# Patient Record
Sex: Male | Born: 1940 | Race: White | Hispanic: No | Marital: Married | Smoking: Never smoker
Health system: Southern US, Community
[De-identification: ages and names within clinical notes are randomized; demographics above are authoritative.]

## PROBLEM LIST (undated history)

## (undated) DIAGNOSIS — Z22322 Carrier or suspected carrier of Methicillin resistant Staphylococcus aureus: Secondary | ICD-10-CM

## (undated) DIAGNOSIS — I509 Heart failure, unspecified: Secondary | ICD-10-CM

## (undated) DIAGNOSIS — G4730 Sleep apnea, unspecified: Secondary | ICD-10-CM

## (undated) DIAGNOSIS — R112 Nausea with vomiting, unspecified: Secondary | ICD-10-CM

## (undated) DIAGNOSIS — E785 Hyperlipidemia, unspecified: Secondary | ICD-10-CM

## (undated) DIAGNOSIS — I2510 Atherosclerotic heart disease of native coronary artery without angina pectoris: Secondary | ICD-10-CM

## (undated) DIAGNOSIS — J449 Chronic obstructive pulmonary disease, unspecified: Secondary | ICD-10-CM

## (undated) DIAGNOSIS — Z951 Presence of aortocoronary bypass graft: Secondary | ICD-10-CM

## (undated) DIAGNOSIS — Z9289 Personal history of other medical treatment: Secondary | ICD-10-CM

## (undated) DIAGNOSIS — I219 Acute myocardial infarction, unspecified: Secondary | ICD-10-CM

## (undated) DIAGNOSIS — C801 Malignant (primary) neoplasm, unspecified: Secondary | ICD-10-CM

## (undated) DIAGNOSIS — I499 Cardiac arrhythmia, unspecified: Secondary | ICD-10-CM

## (undated) DIAGNOSIS — Z955 Presence of coronary angioplasty implant and graft: Secondary | ICD-10-CM

## (undated) DIAGNOSIS — Z87442 Personal history of urinary calculi: Secondary | ICD-10-CM

## (undated) DIAGNOSIS — R0602 Shortness of breath: Secondary | ICD-10-CM

## (undated) DIAGNOSIS — E119 Type 2 diabetes mellitus without complications: Secondary | ICD-10-CM

## (undated) DIAGNOSIS — Z9889 Other specified postprocedural states: Secondary | ICD-10-CM

## (undated) DIAGNOSIS — M1712 Unilateral primary osteoarthritis, left knee: Secondary | ICD-10-CM

## (undated) DIAGNOSIS — Z77098 Contact with and (suspected) exposure to other hazardous, chiefly nonmedicinal, chemicals: Secondary | ICD-10-CM

## (undated) DIAGNOSIS — R011 Cardiac murmur, unspecified: Secondary | ICD-10-CM

## (undated) DIAGNOSIS — I4891 Unspecified atrial fibrillation: Secondary | ICD-10-CM

## (undated) DIAGNOSIS — I10 Essential (primary) hypertension: Secondary | ICD-10-CM

## (undated) DIAGNOSIS — N189 Chronic kidney disease, unspecified: Secondary | ICD-10-CM

## (undated) HISTORY — DX: Unilateral primary osteoarthritis, left knee: M17.12

## (undated) HISTORY — DX: Essential (primary) hypertension: I10

## (undated) HISTORY — DX: Unspecified atrial fibrillation: I48.91

## (undated) HISTORY — DX: Presence of coronary angioplasty implant and graft: Z95.5

## (undated) HISTORY — DX: Presence of aortocoronary bypass graft: Z95.1

## (undated) HISTORY — DX: Hyperlipidemia, unspecified: E78.5

## (undated) HISTORY — DX: Atherosclerotic heart disease of native coronary artery without angina pectoris: I25.10

## (undated) HISTORY — PX: OTHER SURGICAL HISTORY: SHX169

## (undated) HISTORY — PX: NASAL SINUS SURGERY: SHX719

---

## 1986-04-07 HISTORY — PX: CORONARY ARTERY BYPASS GRAFT: SHX141

## 2003-04-08 HISTORY — PX: PTCA: SHX146

## 2003-04-08 HISTORY — PX: OTHER SURGICAL HISTORY: SHX169

## 2004-01-06 ENCOUNTER — Encounter: Payer: Self-pay | Admitting: Internal Medicine

## 2004-02-06 ENCOUNTER — Encounter: Payer: Self-pay | Admitting: Internal Medicine

## 2004-05-14 ENCOUNTER — Encounter: Payer: Self-pay | Admitting: Internal Medicine

## 2005-03-24 ENCOUNTER — Ambulatory Visit: Payer: Self-pay | Admitting: Unknown Physician Specialty

## 2005-04-16 ENCOUNTER — Other Ambulatory Visit: Payer: Self-pay

## 2005-04-16 ENCOUNTER — Inpatient Hospital Stay: Payer: Self-pay | Admitting: Unknown Physician Specialty

## 2005-10-01 ENCOUNTER — Ambulatory Visit: Payer: Self-pay | Admitting: Physician Assistant

## 2005-12-05 ENCOUNTER — Ambulatory Visit: Payer: Self-pay | Admitting: Physician Assistant

## 2006-09-18 ENCOUNTER — Ambulatory Visit: Payer: Self-pay | Admitting: Specialist

## 2007-07-11 ENCOUNTER — Other Ambulatory Visit: Payer: Self-pay

## 2007-07-11 ENCOUNTER — Emergency Department: Payer: Self-pay | Admitting: Unknown Physician Specialty

## 2008-02-15 ENCOUNTER — Ambulatory Visit: Payer: Self-pay | Admitting: Specialist

## 2008-07-25 ENCOUNTER — Ambulatory Visit: Payer: Self-pay | Admitting: Neurology

## 2008-10-20 ENCOUNTER — Ambulatory Visit: Payer: Self-pay | Admitting: Rheumatology

## 2009-05-28 ENCOUNTER — Emergency Department: Payer: Self-pay | Admitting: Emergency Medicine

## 2010-01-28 ENCOUNTER — Ambulatory Visit: Payer: Self-pay | Admitting: Internal Medicine

## 2010-05-21 ENCOUNTER — Ambulatory Visit: Payer: Self-pay | Admitting: Sports Medicine

## 2010-08-08 ENCOUNTER — Ambulatory Visit: Payer: Self-pay | Admitting: Orthopedic Surgery

## 2010-12-16 ENCOUNTER — Other Ambulatory Visit: Payer: Self-pay | Admitting: *Deleted

## 2010-12-16 MED ORDER — LABETALOL HCL 100 MG PO TABS: 100.0000 mg | ORAL_TABLET | Freq: Two times a day (BID) | ORAL | Status: DC

## 2011-01-20 ENCOUNTER — Other Ambulatory Visit: Payer: Self-pay | Admitting: Internal Medicine

## 2011-03-27 ENCOUNTER — Telehealth: Payer: Self-pay | Admitting: Internal Medicine

## 2011-03-27 NOTE — Telephone Encounter (Signed)
Agree. We can see him if symptoms are persistent.

## 2011-03-27 NOTE — Telephone Encounter (Signed)
Cough, no congestion, no fever, some wheezing, no trouble breathing. Advise pt to try mucinex or mucinex dm otc to help

## 2011-03-27 NOTE — Telephone Encounter (Signed)
[phone] Pt called wanting to be seen today or tomorrow sore in ribs back and chest  X 3 weeks Coughing wasn't sure if he has fever

## 2011-03-27 NOTE — Telephone Encounter (Signed)
Pt scheduled for OV tomorrow for eval.

## 2011-03-28 ENCOUNTER — Ambulatory Visit (INDEPENDENT_AMBULATORY_CARE_PROVIDER_SITE_OTHER): Payer: Medicare Other | Admitting: Internal Medicine

## 2011-03-28 ENCOUNTER — Encounter: Payer: Self-pay | Admitting: Internal Medicine

## 2011-03-28 VITALS — BP 138/72 | HR 52 | Temp 97.6°F | Wt 216.0 lb

## 2011-03-28 DIAGNOSIS — E785 Hyperlipidemia, unspecified: Secondary | ICD-10-CM

## 2011-03-28 DIAGNOSIS — E039 Hypothyroidism, unspecified: Secondary | ICD-10-CM

## 2011-03-28 DIAGNOSIS — J40 Bronchitis, not specified as acute or chronic: Secondary | ICD-10-CM

## 2011-03-28 LAB — LIPID PANEL
Cholesterol: 179 mg/dL (ref 0–200)
HDL: 26 mg/dL — ABNORMAL LOW (ref >39.00)
LDL Cholesterol: 124 mg/dL — ABNORMAL HIGH (ref 0–99)
Total CHOL/HDL Ratio: 7
Triglycerides: 146 mg/dL (ref 0.0–149.0)
VLDL: 29.2 mg/dL (ref 0.0–40.0)

## 2011-03-28 LAB — COMPREHENSIVE METABOLIC PANEL
ALT: 14 U/L (ref 0–53)
AST: 15 U/L (ref 0–37)
Albumin: 4.1 g/dL (ref 3.5–5.2)
Alkaline Phosphatase: 57 U/L (ref 39–117)
BUN: 16 mg/dL (ref 6–23)
CO2: 30 mEq/L (ref 19–32)
Calcium: 9.2 mg/dL (ref 8.4–10.5)
Chloride: 101 mEq/L (ref 96–112)
Creatinine, Ser: 1 mg/dL (ref 0.4–1.5)
GFR: 77.6 mL/min (ref >60.00)
Glucose, Bld: 136 mg/dL — ABNORMAL HIGH (ref 70–99)
Potassium: 3.3 mEq/L — ABNORMAL LOW (ref 3.5–5.1)
Sodium: 141 mEq/L (ref 135–145)
Total Bilirubin: 1.1 mg/dL (ref 0.3–1.2)
Total Protein: 7.2 g/dL (ref 6.0–8.3)

## 2011-03-28 LAB — CBC WITH DIFFERENTIAL/PLATELET
Basophils Absolute: 0.1 10*3/uL (ref 0.0–0.1)
Basophils Relative: 0.7 % (ref 0.0–3.0)
Eosinophils Absolute: 0.5 10*3/uL (ref 0.0–0.7)
Eosinophils Relative: 5.1 % — ABNORMAL HIGH (ref 0.0–5.0)
HCT: 41.2 % (ref 39.0–52.0)
Hemoglobin: 13.6 g/dL (ref 13.0–17.0)
Lymphocytes Relative: 18.4 % (ref 12.0–46.0)
Lymphs Abs: 1.7 10*3/uL (ref 0.7–4.0)
MCHC: 33.1 g/dL (ref 30.0–36.0)
MCV: 85.3 fl (ref 78.0–100.0)
Monocytes Absolute: 0.8 10*3/uL (ref 0.1–1.0)
Monocytes Relative: 9.1 % (ref 3.0–12.0)
Neutro Abs: 6.1 10*3/uL (ref 1.4–7.7)
Neutrophils Relative %: 66.7 % (ref 43.0–77.0)
Platelets: 219 10*3/uL (ref 150.0–400.0)
RBC: 4.83 Mil/uL (ref 4.22–5.81)
RDW: 14.5 % (ref 11.5–14.6)
WBC: 9.2 10*3/uL (ref 4.5–10.5)

## 2011-03-28 LAB — TSH: TSH: 3.39 u[IU]/mL (ref 0.35–5.50)

## 2011-03-28 MED ORDER — ALBUTEROL SULFATE HFA 108 (90 BASE) MCG/ACT IN AERS: 2.0000 | INHALATION_SPRAY | Freq: Four times a day (QID) | RESPIRATORY_TRACT | Status: DC | PRN

## 2011-03-28 MED ORDER — PREDNISONE (PAK) 10 MG PO TABS: ORAL_TABLET | ORAL | Status: DC

## 2011-03-28 MED ORDER — DOXYCYCLINE HYCLATE 100 MG PO TABS: 100.0000 mg | ORAL_TABLET | Freq: Two times a day (BID) | ORAL | Status: AC

## 2011-03-29 ENCOUNTER — Encounter: Payer: Self-pay | Admitting: Internal Medicine

## 2011-03-29 DIAGNOSIS — E039 Hypothyroidism, unspecified: Secondary | ICD-10-CM | POA: Insufficient documentation

## 2011-03-29 DIAGNOSIS — E785 Hyperlipidemia, unspecified: Secondary | ICD-10-CM | POA: Insufficient documentation

## 2011-03-29 NOTE — Progress Notes (Signed)
Subjective:    Patient ID: Chase Keller, male    DOB: May 27, 1940, 70 y.o.   MRN: [id number]  HPI 70 year old male presents for an acute visit complaining of a one-week history of cough and shortness of breath. He notes that over the last 2 weeks he developed some nasal congestion and sinus pressure. Over the last week he has noted dry cough and worsening shortness of breath. His wife notes that he has become very short of breath when walking even short distances or up a flight of stairs. He had an inhaler at home which she thinks was albuterol and tried using this with minimal improvement. He is not a smoker and has no history of smoking but was exposed to secondhand smoke as a child. She notes that he has never been diagnosed with COPD. He does have sleep apnea. He denies any fever or chills.  He is also concerned because he was told in the past that he had a thyroid disorder. He reports that this has not been checked recently. He stopped taking his thyroid medication several months ago.  Outpatient Encounter Prescriptions as of 03/28/2011  Medication Sig Dispense Refill  . clopidogrel (PLAVIX) 75 MG tablet TAKE 1 TABLET BY MOUTH DAILY  30 tablet  6  . fish oil-omega-3 fatty acids 1000 MG capsule Take 1 g by mouth daily.        Marland Kitchen glucosamine-chondroitin 500-400 MG tablet Take 1 tablet by mouth 2 (two) times daily.        Marland Kitchen labetalol (NORMODYNE) 100 MG tablet Take 1 tablet (100 mg total) by mouth 2 (two) times daily.  60 tablet  5  . OVER THE COUNTER MEDICATION B1 250 mg, B2 100mg , B6 100mg , B12 1000mg , C 1000mg , Calcium 1200mg , D 1000IU, D3 5000IU, E 400IU       . Potassium 99 MG TABS Take by mouth 2 (two) times daily.        . valsartan-hydrochlorothiazide (DIOVAN-HCT) 160-25 MG per tablet Take 1 tablet by mouth daily.          Review of Systems  Constitutional: Negative for fever, chills, activity change and fatigue.  HENT: Positive for congestion and sinus pressure. Negative for hearing  loss, ear pain, nosebleeds, sore throat, rhinorrhea, sneezing, trouble swallowing, neck pain, neck stiffness, voice change, postnasal drip, tinnitus and ear discharge.   Eyes: Negative for discharge, redness, itching and visual disturbance.  Respiratory: Positive for cough, chest tightness and shortness of breath. Negative for wheezing and stridor.   Cardiovascular: Negative for chest pain and leg swelling.  Musculoskeletal: Negative for myalgias and arthralgias.  Skin: Negative for color change and rash.  Neurological: Negative for dizziness, facial asymmetry and headaches.  Psychiatric/Behavioral: Negative for sleep disturbance.   BP 138/72  Pulse 52  Temp(Src) 97.6 F (36.4 C) (Oral)  Wt 216 lb (97.977 kg)  SpO2 93%     Objective:   Physical Exam  Constitutional: He is oriented to person, place, and time. He appears well-developed and well-nourished. No distress.  HENT:  Head: Normocephalic and atraumatic.  Right Ear: External ear normal.  Left Ear: External ear normal.  Nose: Nose normal.  Mouth/Throat: Oropharynx is clear and moist. No oropharyngeal exudate.  Eyes: Conjunctivae and EOM are normal. Pupils are equal, round, and reactive to light. Right eye exhibits no discharge. Left eye exhibits no discharge. No scleral icterus.  Neck: Normal range of motion. Neck supple. No tracheal deviation present. No thyromegaly present.  Cardiovascular: Normal rate, regular rhythm  and normal heart sounds.  Exam reveals no gallop and no friction rub.   No murmur heard. Pulmonary/Chest: Effort normal. No accessory muscle usage. Not tachypneic. No respiratory distress. He has decreased breath sounds (very poor air movement with prolonged expiratory phase). He has no wheezes. He has rhonchi (diffuse). He has no rales. He exhibits no tenderness.  Musculoskeletal: Normal range of motion. He exhibits no edema.  Lymphadenopathy:    He has no cervical adenopathy.  Neurological: He is alert and  oriented to person, place, and time. No cranial nerve deficit. Coordination normal.  Skin: Skin is warm and dry. No rash noted. He is not diaphoretic. No erythema. No pallor.  Psychiatric: He has a normal mood and affect. His behavior is normal. Judgment and thought content normal.          Assessment & Plan:  1. Bronchitis -symptoms and exam are most consistent with bronchitis. Will start doxycycline and a steroid taper. Will refill his albuterol. If his symptoms persist or worsen over the next 48 hours he will call.  2. Possible thyroid disorder - will check TSH with labs today. Based on his description, it sounds like he was diagnosed with hypothyroidism in the past and he has been off medication. We will try to obtain records from previous office.  3. Hypertension - blood pressure well-controlled today. He has not recently had renal function checked so we'll check this with labs. He will followup in one month.

## 2011-04-04 ENCOUNTER — Encounter: Payer: Self-pay | Admitting: Internal Medicine

## 2011-04-04 ENCOUNTER — Ambulatory Visit (INDEPENDENT_AMBULATORY_CARE_PROVIDER_SITE_OTHER): Payer: Medicare Other | Admitting: Internal Medicine

## 2011-04-04 VITALS — BP 130/80 | HR 68 | Temp 98.3°F | Wt 212.5 lb

## 2011-04-04 DIAGNOSIS — R059 Cough, unspecified: Secondary | ICD-10-CM

## 2011-04-04 DIAGNOSIS — G4733 Obstructive sleep apnea (adult) (pediatric): Secondary | ICD-10-CM

## 2011-04-04 DIAGNOSIS — E785 Hyperlipidemia, unspecified: Secondary | ICD-10-CM

## 2011-04-04 DIAGNOSIS — I10 Essential (primary) hypertension: Secondary | ICD-10-CM

## 2011-04-04 DIAGNOSIS — E039 Hypothyroidism, unspecified: Secondary | ICD-10-CM

## 2011-04-04 DIAGNOSIS — E876 Hypokalemia: Secondary | ICD-10-CM

## 2011-04-04 DIAGNOSIS — R05 Cough: Secondary | ICD-10-CM

## 2011-04-04 NOTE — Patient Instructions (Addendum)
Try red yeast rice 600 mg twice daily for your cholesterol   Your thyroid is normal,  You do not need medication.  Your blood pressure is fine.   Your potassium was low so poease increase your potassium to twice daily and we will recheck in 2 weeks.   We are ordering pulmonary function tests to investigate your chronic cough

## 2011-04-04 NOTE — Progress Notes (Signed)
Subjective:    Patient ID: Chase Keller, male    DOB: December 01, 1940, 70 y.o.   MRN: [id number]  HPI Chase Keller is a 70 yr old male with OSA , CAD, hypertension and hyperlipidemia who returns for followup on bronchitis treated by Dr. Dan Humphreys  with doxy and prednisone on Dec 21.He doesn't  know if he's any better , since he states that he has had a daily cough for  5 years.  He sees Dr.Fleming but has never had pulmonary function tests and has occupational exposure to carbon and other metals.  Past Medical History  Diagnosis Date  . Hypertension   . Hyperlipidemia    Past Surgical History  Procedure Date  . Coronary artery bypass graft 1998    2 vessel,  followed by [facility]  . [facility] 2005    [name]    Current Outpatient Prescriptions on File Prior to Visit  Medication Sig Dispense Refill  . albuterol (VENTOLIN HFA) 108 (90 BASE) MCG/ACT inhaler Inhale 2 puffs into the lungs every 6 (six) hours as needed for wheezing.  18 g  3  . clopidogrel (PLAVIX) 75 MG tablet TAKE 1 TABLET BY MOUTH DAILY  30 tablet  6  . doxycycline (VIBRA-TABS) 100 MG tablet Take 1 tablet (100 mg total) by mouth 2 (two) times daily.  20 tablet  0  . fish oil-omega-3 fatty acids 1000 MG capsule Take 1 g by mouth daily.        Marland Kitchen glucosamine-chondroitin 500-400 MG tablet Take 1 tablet by mouth 2 (two) times daily.        Marland Kitchen labetalol (NORMODYNE) 100 MG tablet Take 1 tablet (100 mg total) by mouth 2 (two) times daily.  60 tablet  5  . Potassium 99 MG TABS Take by mouth 2 (two) times daily.        . valsartan-hydrochlorothiazide (DIOVAN-HCT) 160-25 MG per tablet Take 1 tablet by mouth daily.        Marland Kitchen OVER THE COUNTER MEDICATION B1 250 mg, B2 100mg , B6 100mg , B12 1000mg , C 1000mg , Calcium 1200mg , D 1000IU, D3 5000IU, E 400IU          Review of Systems  Constitutional: Negative for fever, chills, activity change and fatigue.  HENT: Positive for congestion and sinus pressure. Negative for hearing loss, ear pain,  nosebleeds, sore throat, rhinorrhea, sneezing, trouble swallowing, neck pain, neck stiffness, voice change, postnasal drip, tinnitus and ear discharge.   Eyes: Negative for discharge, redness, itching and visual disturbance.  Respiratory: Positive for cough, chest tightness and shortness of breath. Negative for wheezing and stridor.   Cardiovascular: Negative for chest pain and leg swelling.  Musculoskeletal: Negative for myalgias and arthralgias.  Skin: Negative for color change and rash.  Neurological: Negative for dizziness, facial asymmetry and headaches.  Psychiatric/Behavioral: Negative for sleep disturbance.       Objective:   Physical Exam  Constitutional: He is oriented to person, place, and time. He appears well-developed and well-nourished. No distress.  HENT:  Head: Normocephalic and atraumatic.  Right Ear: External ear normal.  Left Ear: External ear normal.  Nose: Nose normal.  Mouth/Throat: Oropharynx is clear and moist. No oropharyngeal exudate.  Eyes: Conjunctivae and EOM are normal. Pupils are equal, round, and reactive to light. Right eye exhibits no discharge. Left eye exhibits no discharge. No scleral icterus.  Neck: Normal range of motion. Neck supple. No tracheal deviation present. No thyromegaly present.  Cardiovascular: Normal rate, regular rhythm and normal heart sounds.  Exam  reveals no gallop and no friction rub.   No murmur heard. Pulmonary/Chest: Effort normal. No accessory muscle usage. Not tachypneic. No respiratory distress. He has decreased breath sounds (very poor air movement with prolonged expiratory phase). He has no wheezes. He has rhonchi (diffuse). He has no rales. He exhibits no tenderness.  Musculoskeletal: Normal range of motion. He exhibits no edema.  Lymphadenopathy:    He has no cervical adenopathy.  Neurological: He is alert and oriented to person, place, and time. No cranial nerve deficit. Coordination normal.  Skin: Skin is warm and dry.  No rash noted. He is not diaphoretic. No erythema. No pallor.  Psychiatric: He has a normal mood and affect. His behavior is normal. Judgment and thought content normal.          Assessment & Plan:

## 2011-04-04 NOTE — Assessment & Plan Note (Addendum)
He did not tolerate crestor trial in the past due to severe myalgias .  He i s willing to try red yeast rice

## 2011-04-06 ENCOUNTER — Encounter: Payer: Self-pay | Admitting: Internal Medicine

## 2011-04-06 DIAGNOSIS — I10 Essential (primary) hypertension: Secondary | ICD-10-CM | POA: Insufficient documentation

## 2011-04-06 DIAGNOSIS — R059 Cough, unspecified: Secondary | ICD-10-CM | POA: Insufficient documentation

## 2011-04-06 DIAGNOSIS — R05 Cough: Secondary | ICD-10-CM | POA: Insufficient documentation

## 2011-04-06 NOTE — Assessment & Plan Note (Addendum)
Well controlled on labetalol andvalsartan/hct. No changes today.

## 2011-04-06 NOTE — Assessment & Plan Note (Signed)
Well managed with current levothyroxine dose.

## 2011-04-06 NOTE — Assessment & Plan Note (Signed)
Etiology unclear,  He has no history of heart failure or ACE Inhibitor use. I am concerned about pulmonary fibrosis from occuaptional exposure to metals.  Will send for pulmonary function tests, followed by CT of chest. And refer to Dr. Kendrick Fries if abnormal.

## 2011-04-08 DIAGNOSIS — I4891 Unspecified atrial fibrillation: Secondary | ICD-10-CM

## 2011-04-08 HISTORY — DX: Unspecified atrial fibrillation: I48.91

## 2011-04-16 ENCOUNTER — Ambulatory Visit (INDEPENDENT_AMBULATORY_CARE_PROVIDER_SITE_OTHER): Payer: Medicare Other | Admitting: Pulmonary Disease

## 2011-04-16 ENCOUNTER — Encounter: Payer: Self-pay | Admitting: Pulmonary Disease

## 2011-04-16 VITALS — BP 118/66 | HR 76 | Temp 97.9°F | Ht 67.5 in | Wt 216.0 lb

## 2011-04-16 DIAGNOSIS — R05 Cough: Secondary | ICD-10-CM

## 2011-04-16 DIAGNOSIS — R059 Cough, unspecified: Secondary | ICD-10-CM

## 2011-04-16 NOTE — Assessment & Plan Note (Signed)
Chronic cough, or cough lasting longer than 8 weeks is due to any of the following conditions in nearly 95% of cases: upper airway cough syndrome, GERD, asthma, chronic bronchitis, bronchiectasis, and non asthmatic eosinophilic bronchitis.  Smoking and ACE-Inhibitors are other common causes to consider.    I am not certain why he has cough at this point because he reports minimal reflux or sinus symptoms.  He does note that when he is exposed to dust the cough is worse and he occasionally has sinus congestion with this.  He also notes rarely choking on food, less than once per month.    Because of his exposures to chemicals and in the past and his crackles heard on lung exam I share Dr. Melina Schools concern about an interstitial lung disease.  We will refer him for full PFT's and a CXR and eventually a CT thorax without contrast if those are negative.  If that work up is negative then I will start him empirically on a PPI and a nasal steroid.  In the interim I will have him start taking Delsym, voice rest and using hard candies to try to treat control the symptoms.  We will see him back in 2-4 weeks.

## 2011-04-16 NOTE — Patient Instructions (Signed)
We will refer you for full pulmonary function tests and a chest x-ray of your lungs. We will call you with the results.  Chronic cough, or cough lasting longer than 8 weeks, can be caused by many different conditions, including smoking, asthma, sinus congestion/runny nose, acid reflux or medications.  We need to gather more information to decide why you have cough.  Take delsym two tsp every 12 hours. Swallowing water or using ice chips/non mint and menthol containing candies (such as lifesavers or sugarless jolly ranchers or Hall's) are also effective. You should rest your voice and avoid activities that you know make you cough.   Once you have eliminated the cough for 3 straight days try reducing the delsym as tolerated.   We will see you back in two to four weeks.

## 2011-04-16 NOTE — Progress Notes (Signed)
Subjective:    Patient ID: Chase Keller, male    DOB: Jun 08, 1940, 71 y.o.   MRN: [id number]  HPI This is a 71 y/o male who presents to our clinic today for evaluation of cough.  He states that he had no respiratory symptoms as a child and has never been hospitalized for a respiratory problem. He noticed 4-5 years ago the subtle onset of a dry cough.  The cough has persisted, perhaps worsening slightly during the last five years.  It has remained dry and is without sputum production.  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  He does not know of any other environmental triggers.  The cough is not worse at night or any particular part of the day.  He is not short of breath, though he states that he does not exercise regularly.  The cough is not worse with eating, while recumbent.  He denies sinus congestion or post nasal drip except when he is exposed to the dust and gets a rare cold.  He states that he believes that he does not have the cough most days, but every 2-3 days he has coughing fits that bother him.  He rarely notices food "going down the wrong pipe" about once a month and denies symptoms of acid reflux.  Past Medical History  Diagnosis Date  . Hypertension   . Hyperlipidemia      No family history on file.   History   Social History  . Marital Status: Married    Spouse Name: N/A    Number of Children: 0  . Years of Education: N/A   Occupational History  . Electrician - workes [employer]     has had previous exposure to metals and also to agent orange   Social History Main Topics  . Smoking status: Never Smoker   . Smokeless tobacco: Never Used   Comment: father smoked in the home while pt was growing up  . Alcohol Use: No  . Drug Use: No  . Sexually Active: Not on file   Other Topics Concern  . Not on file   Social History Narrative  . No narrative on file     No Known Allergies    Outpatient Prescriptions Prior to Visit  Medication Sig Dispense Refill  . albuterol (VENTOLIN HFA) 108 (90 BASE) MCG/ACT inhaler Inhale 2 puffs into the lungs every 6 (six) hours as needed for wheezing.  18 g  3  . clopidogrel (PLAVIX) 75 MG tablet TAKE 1 TABLET BY MOUTH DAILY  30 tablet  6  . fish oil-omega-3 fatty acids 1000 MG capsule Take 1 g by mouth daily.        Marland Kitchen glucosamine-chondroitin 500-400 MG tablet Take 1 tablet by mouth 2 (two) times daily.        Marland Kitchen labetalol (NORMODYNE) 100 MG tablet Take 1 tablet (100 mg total) by mouth 2 (two) times daily.  60 tablet  5  . OVER THE COUNTER MEDICATION B1 250 mg, B2 100mg , B6 100mg , B12 1000mg , C 1000mg , Calcium 1200mg , D 1000IU, D3 5000IU, E 400IU       . Potassium 99 MG TABS Take by mouth 2 (two) times daily.        . valsartan-hydrochlorothiazide (DIOVAN-HCT) 160-25 MG per tablet Take 1 tablet by mouth daily.            Review of Systems  Constitutional:  Negative for fever, chills, activity change, appetite change and unexpected weight change.  HENT: Negative for congestion, sore throat, rhinorrhea, sneezing, trouble swallowing, dental problem, voice change and postnasal drip.   Eyes: Negative for visual disturbance.  Respiratory: Positive for cough. Negative for choking and shortness of breath.   Cardiovascular: Negative for chest pain and leg swelling.  Gastrointestinal: Negative for nausea, vomiting and abdominal pain.  Genitourinary: Negative for difficulty urinating.  Musculoskeletal: Negative for arthralgias.  Skin: Negative for rash.  Psychiatric/Behavioral: Negative for behavioral problems and confusion.       Objective:   Physical Exam Filed Vitals:   04/16/11 1649  BP: 118/66  Pulse: 76  Temp: 97.9 F (36.6 C)  TempSrc: Oral  Height: 5' 7.5" (1.715 m)  Weight: 216 lb (97.977 kg)  SpO2: 96%    Gen: well appearing, no acute distress HEENT: NCAT, PERRL, EOMi, OP clear, neck supple without masses PULM: insp  crackles L base, otherwise clear CV: RRR, holosystolic murmur at apex, radiates to axilla, no JVD AB: BS+, soft, nontender, no hsm Ext: warm, no edema, no clubbing, no cyanosis Derm: no rash or skin breakdown Neuro: A&Ox4, CN II-XII intact, strength 5/5 in all 4 extremities        Assessment & Plan:   Cough Chronic cough, or cough lasting longer than 8 weeks is due to any of the following conditions in nearly 95% of cases: upper airway cough syndrome, GERD, asthma, chronic bronchitis, bronchiectasis, and non asthmatic eosinophilic bronchitis.  Smoking and ACE-Inhibitors are other common causes to consider.    I am not certain why he has cough at this point because he reports minimal reflux or sinus symptoms.  He does note that when he is exposed to dust the cough is worse and he occasionally has sinus congestion with this.  He also notes rarely choking on food, less than once per month.    Because of his exposures to chemicals and in the past and his crackles heard on lung exam I share Dr. Melina Schools concern about an interstitial lung disease.  We will refer him for full PFT's and a CXR and eventually a CT thorax without contrast if those are negative.  If that work up is negative then I will start him empirically on a PPI and a nasal steroid.  In the interim I will have him start taking Delsym, voice rest and using hard candies to try to treat control the symptoms.  We will see him back in 2-4 weeks.    Updated Medication List Outpatient Encounter Prescriptions as of 04/16/2011  Medication Sig Dispense Refill  . albuterol (VENTOLIN HFA) 108 (90 BASE) MCG/ACT inhaler Inhale 2 puffs into the lungs every 6 (six) hours as needed for wheezing.  18 g  3  . clopidogrel (PLAVIX) 75 MG tablet TAKE 1 TABLET BY MOUTH DAILY  30 tablet  6  . fish oil-omega-3 fatty acids 1000 MG capsule Take 1 g by mouth daily.        Marland Kitchen glucosamine-chondroitin 500-400 MG tablet Take 1 tablet by mouth 2 (two) times  daily.        Marland Kitchen labetalol (NORMODYNE) 100 MG tablet Take 1 tablet (100 mg total) by mouth 2 (two) times daily.  60 tablet  5  . OVER THE COUNTER MEDICATION B1 250 mg, B2 100mg , B6 100mg , B12 1000mg , C 1000mg , Calcium 1200mg , D 1000IU, D3 5000IU, E 400IU       . Potassium 99 MG TABS Take by mouth  2 (two) times daily.        . valsartan-hydrochlorothiazide (DIOVAN-HCT) 160-25 MG per tablet Take 1 tablet by mouth daily.

## 2011-04-18 ENCOUNTER — Ambulatory Visit (INDEPENDENT_AMBULATORY_CARE_PROVIDER_SITE_OTHER)
Admission: RE | Admit: 2011-04-18 | Discharge: 2011-04-18 | Disposition: A | Discharge status: Home / Self Care | Payer: Medicare Other | Source: Ambulatory Visit | Attending: Pulmonary Disease | Admitting: Pulmonary Disease

## 2011-04-18 ENCOUNTER — Encounter: Payer: Medicare Other | Admitting: Internal Medicine

## 2011-04-18 DIAGNOSIS — R059 Cough, unspecified: Secondary | ICD-10-CM

## 2011-04-18 DIAGNOSIS — Z0289 Encounter for other administrative examinations: Secondary | ICD-10-CM

## 2011-04-18 DIAGNOSIS — R05 Cough: Secondary | ICD-10-CM

## 2011-04-21 ENCOUNTER — Telehealth: Payer: Self-pay | Admitting: Pulmonary Disease

## 2011-04-21 DIAGNOSIS — R05 Cough: Secondary | ICD-10-CM

## 2011-04-21 DIAGNOSIS — R059 Cough, unspecified: Secondary | ICD-10-CM

## 2011-04-21 NOTE — Telephone Encounter (Signed)
I called Mr. Bitner to discuss his CXR findings of an enlarged heart.  He says he has known this all his life.  We will send him for a CT to better evaluate for interstitial lung disease.

## 2011-04-21 NOTE — Telephone Encounter (Signed)
Will forward to [facility] to check on this. Please advise, thanks!

## 2011-04-22 ENCOUNTER — Telehealth: Payer: Self-pay | Admitting: Pulmonary Disease

## 2011-04-22 NOTE — Telephone Encounter (Signed)
precert done [facility] called

## 2011-04-22 NOTE — Telephone Encounter (Signed)
Will forward to [facility] for pre auth of CT Chest

## 2011-04-24 ENCOUNTER — Ambulatory Visit: Payer: Self-pay | Admitting: Pulmonary Disease

## 2011-04-24 ENCOUNTER — Telehealth: Payer: Self-pay | Admitting: Pulmonary Disease

## 2011-04-24 NOTE — Telephone Encounter (Signed)
Called and spoke with scheduling at [facility] and RT has already left for today. PFT scheduled for Tues 04/29/11 at 1:00 at [facility]. Pt to check in at 12:30. Called and spoke with Boneta Lucks at [facility] and gave appointment information to her. Advised pt to go ahead and have CT Chest done today since he was already there and return on Tues 04/29/11 at 12:30 for PFT. Boneta Lucks will advise pt of the appointment.  Boneta Lucks advised to have the patient contact our office if this was not a suitable appointment. Order faxed to [facility] at [phone].

## 2011-04-24 NOTE — Telephone Encounter (Signed)
ATC Jenny at [facility].  No answer.  Pt needs both CT and PFT done per previous phone messages.  [facility], please advise.  Pt is waiting to have CT done now and was supposed to get PFTs scheduled as well.

## 2011-04-29 ENCOUNTER — Ambulatory Visit: Payer: Self-pay | Admitting: Pulmonary Disease

## 2011-04-29 LAB — PULMONARY FUNCTION TEST

## 2011-05-12 ENCOUNTER — Encounter: Payer: Self-pay | Admitting: Pulmonary Disease

## 2011-05-12 ENCOUNTER — Ambulatory Visit (INDEPENDENT_AMBULATORY_CARE_PROVIDER_SITE_OTHER): Payer: Medicare Other | Admitting: Pulmonary Disease

## 2011-05-12 VITALS — BP 104/66 | HR 70 | Temp 97.9°F | Ht 67.5 in | Wt 215.0 lb

## 2011-05-12 DIAGNOSIS — R05 Cough: Secondary | ICD-10-CM

## 2011-05-12 DIAGNOSIS — R059 Cough, unspecified: Secondary | ICD-10-CM

## 2011-05-12 NOTE — Patient Instructions (Signed)
Use Neil Med rinses with distilled water at least twice per day using the instructions on the package. 1/2 hour after using the [facility] Med rinse, use Nasonex two puffs in each nostril once per day. Use chlortrimeton and an over the counter decongestant (ask the pharmacist for a recommendation) as needed for the cough. If you are not better in 2 weeks, then start taking Prilosec 20mg  oral daily.  We will see you back in 2 months.

## 2011-05-12 NOTE — Assessment & Plan Note (Addendum)
We are reassured by Mr. Helbig's normal chest CT and PFT's that there is little evidence that he has a diffuse parenchymal lung disease from a prior occupational exposure.    I believe that his cough is due to upper airway cough syndrome given that he coughs more when exposed to dust.  He also has a red throat and is overweight so asymptomatic reflux could also be contributing.  I have suggested a stepwise approach to starting treatment for the cough: -First, we will start saline rinses, first generation antihistamines, OTC decongestants, and Nasonex -If he has improvement with this, then he was instructed to call us for an Rx for Nasonex two puffs each nare daily -If he has not had improvement in the cough in 2 weeks, then I will ask him to start prilosec over the counter  We will see him back in two months.

## 2011-05-12 NOTE — Progress Notes (Signed)
Subjective:    Patient ID: Chase Keller, male    DOB: Dec 12, 1940, 71 y.o.   MRN: [id number]  Synopsis: Chase Keller is a 71 y/o male who first came to the [facility] in 2013 for evaluation of chronic cough.  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  HPI 05/12/11 4 week f/u-- Chase Keller returns for evaluation of his dry chronic cough.  He states that he thinks that the cough may have improved since we saw him last.  No sputum production.  He still notes that he has coughing fits while exposed to heavy dust.  He has not been using the albuterol inhaler and he has not tried the Nasonex samples.   Review of Systems  Gen: no fever, chills, weight change ENT: no sinus pressure, drainage, scratchy throat GI: no heartburn, choking, dysphagia    Objective:   Physical Exam  Filed Vitals:   05/12/11 1329  BP: 104/66  Pulse: 70  Temp: 97.9 F (36.6 C)  TempSrc: Oral  Height: 5' 7.5" (1.715 m)  Weight: 215 lb (97.523 kg)  SpO2: 96%   Gen: well appearing, no acute distress HEENT: NCAT, PERRL, EOMi, OP dry and erythematous, no pustules or drainage Neck: supple without masses PULM: CTA B CV: RRR, no mgr, no JVD AB: BS+, soft, nontender, no hsm Ext: warm, no edema, no clubbing, no cyanosis Derm: no rash or skin breakdown Neuro: A&Ox4, CN II-XII intact, strength 5/5 in all 4 extremities   Full PFT's at [facility] 04/2011 F/F Ratio 81%, FEV1 2.66 L (100%), TLC 5.21 L (90%), DLCO 93%, Flow loop with cough  Chest CT 04/24/2011: Normal lung parenchyma, several small nodules <75mm, cholelithiasis      Assessment & Plan:   Cough We are reassured by Chase Keller's normal chest CT and PFT's that there is little evidence that he has a diffuse parenchymal lung disease from a  prior occupational exposure.    I believe that his cough is due to upper airway cough syndrome given that he coughs more when exposed to dust.  He also has a red throat and is overweight so asymptomatic reflux could also be contributing.  I have suggested a stepwise approach to starting treatment for the cough: -First, we will start saline rinses, first generation antihistamines, OTC decongestants, and Nasonex -If he has improvement with this, then he was instructed to call us for an Rx for Nasonex two puffs each nare daily -If he has not had improvement in the cough in 2 weeks, then I will ask him to start prilosec over the counter  We will see him back in two months.  Pulmonary Nodules: He has several very small nodules but is very low risk for malignancy.  Will discuss his concern for this and the risks and benefits of further testing at the next visit and assess wether or not he wants a follow up CT at 12 months.  I would favor no further imaging given the low risk.  Updated Medication List Outpatient Encounter Prescriptions as of 05/12/2011  Medication Sig Dispense Refill  . albuterol (VENTOLIN HFA) 108 (90 BASE) MCG/ACT inhaler Inhale 2 puffs into the lungs every 6 (six) hours as needed for wheezing.  18 g  3  . clopidogrel (PLAVIX) 75 MG tablet TAKE  1 TABLET BY MOUTH DAILY  30 tablet  6  . fish oil-omega-3 fatty acids 1000 MG capsule Take 1 g by mouth daily.        Marland Kitchen glucosamine-chondroitin 500-400 MG tablet Take 1 tablet by mouth 2 (two) times daily.        Marland Kitchen labetalol (NORMODYNE) 100 MG tablet Take 1 tablet (100 mg total) by mouth 2 (two) times daily.  60 tablet  5  . OVER THE COUNTER MEDICATION B1 250 mg, B2 100mg , B6 100mg , B12 1000mg , C 1000mg , Calcium 1200mg , D 1000IU, D3 5000IU, E 400IU       . Potassium 99 MG TABS Take by mouth 2 (two) times daily.        . valsartan-hydrochlorothiazide (DIOVAN-HCT) 160-25 MG per tablet Take 1 tablet by mouth daily.

## 2011-07-11 ENCOUNTER — Telehealth: Payer: Self-pay | Admitting: Internal Medicine

## 2011-07-11 MED ORDER — VALSARTAN-HYDROCHLOROTHIAZIDE 160-25 MG PO TABS: 1.0000 | ORAL_TABLET | Freq: Every day | ORAL | Status: DC

## 2011-07-11 NOTE — Telephone Encounter (Signed)
Patient is needing a refill on his generic Diovan HCT 160-25

## 2011-07-11 NOTE — Telephone Encounter (Signed)
Rx called in 

## 2011-07-23 ENCOUNTER — Ambulatory Visit: Payer: Medicare Other | Admitting: Pulmonary Disease

## 2011-07-24 ENCOUNTER — Encounter: Payer: Self-pay | Admitting: Pulmonary Disease

## 2011-07-24 ENCOUNTER — Ambulatory Visit (INDEPENDENT_AMBULATORY_CARE_PROVIDER_SITE_OTHER): Payer: Medicare Other | Admitting: Pulmonary Disease

## 2011-07-24 VITALS — BP 132/74 | HR 80 | Temp 98.2°F | Ht 67.5 in | Wt 219.1 lb

## 2011-07-24 DIAGNOSIS — R059 Cough, unspecified: Secondary | ICD-10-CM

## 2011-07-24 DIAGNOSIS — R05 Cough: Secondary | ICD-10-CM

## 2011-07-24 NOTE — Assessment & Plan Note (Signed)
[name] cough is mostly due to upper airway cough syndrome/post nasal drip.  It improved on saline rinses but he stopped these when he started having bloody mucus.  He did not take the nasal steroid any more than three or four times. He was using the saline rinses three times per day.  Plan: -restart Nasonex (I explained to him that it takes a week or more of regular use prior to seeing results) -restart nasal rinses, just once per day -use chlortrimeton as needed for cough -rtc 4-6 months

## 2011-07-24 NOTE — Progress Notes (Signed)
Subjective:    Patient ID: Chase Keller, male    DOB: 04/15/1940, 71 y.o.   MRN: [id number]  Synopsis: Chase Keller is a 71 y/o male who first came to the [facility] in 2013 for evaluation of chronic cough.  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  HPI  05/12/11 4 week f/u-- [name] returns for evaluation of his dry chronic cough.  He states that he thinks that the cough may have improved since we saw him last.  No sputum production.  He still notes that he has coughing fits while exposed to heavy dust.  He has not been using the albuterol inhaler and he has not tried the Nasonex samples.  07/24/11 ROV --His cough is much better. He took the nasal wash for two weeks, but then got a bloody nose so he stopped.  His cough was much better with nasal rinses, and picked up after he stopped using them.  Overall improved since the last visit.  He only used the nasonex three or four times.  Review of Systems      Objective:   Physical Exam   Filed Vitals:   07/24/11 1606  BP: 132/74  Pulse: 80  Temp: 98.2 F (36.8 C)  TempSrc: Oral  Height: 5' 7.5" (1.715 m)  Weight: 219 lb 1.6 oz (99.383 kg)  SpO2: 95%   Gen: well appearing, no acute distress HEENT: NCAT, PERRL, EOMi, OP dry but less erythema than prior Neck: supple without masses PULM: CTA B CV: RRR, no mgr, no JVD Ext: warm, no edema, no clubbing, no cyanosis   Full PFT's at [facility] 04/2011 F/F Ratio 81%, FEV1 2.66 L (100%), TLC 5.21 L (90%), DLCO 93%, Flow loop with cough  Chest CT 04/24/2011: Normal lung parenchyma, several small nodules <48mm, cholelithiasis      Assessment & Plan:   Cough Mr. Willert cough is mostly due to upper airway cough syndrome/post nasal drip.  It improved on saline rinses  but he stopped these when he started having bloody mucus.  He did not take the nasal steroid any more than three or four times. He was using the saline rinses three times per day.  Plan: -restart Nasonex (I explained to him that it takes a week or more of regular use prior to seeing results) -restart nasal rinses, just once per day -use chlortrimeton as needed for cough -rtc 4-6 months    Pulmonary Nodules: Discuss whether or not he wants to have a repeat CT on his return visit.  I favor no.  Updated Medication List Outpatient Encounter Prescriptions as of 07/24/2011  Medication Sig Dispense Refill  . albuterol (VENTOLIN HFA) 108 (90 BASE) MCG/ACT inhaler Inhale 2 puffs into the lungs every 6 (six) hours as needed for wheezing.  18 g  3  . clopidogrel (PLAVIX) 75 MG tablet TAKE 1 TABLET BY MOUTH DAILY  30 tablet  6  . fish oil-omega-3 fatty acids 1000 MG capsule Take 1 g by mouth daily.        Marland Kitchen glucosamine-chondroitin 500-400 MG tablet Take 1 tablet by mouth 2 (two) times daily.        Marland Kitchen labetalol (NORMODYNE) 100 MG tablet Take 1 tablet (100 mg total) by mouth 2 (two) times daily.  60 tablet  5  . OVER THE COUNTER MEDICATION B1 250 mg, B2 100mg , B6 100mg , B12 1000mg , C 1000mg , Calcium 1200mg , D 1000IU, D3 5000IU, E 400IU       . Potassium 99 MG TABS Take by mouth 2 (two) times daily.        . valsartan-hydrochlorothiazide (DIOVAN-HCT) 160-25 MG per tablet Take 1 tablet by mouth daily.  30 tablet  3

## 2011-07-24 NOTE — Patient Instructions (Signed)
We will get your records from Dr. Mayo Ao Use [facility] Med rinses with distilled water at least twice per day using the instructions on the package. 1/2 hour after using the [facility] Med rinse, use Nasonex two puffs in each nostril once per day. Use chlortrimeton and an over the counter decongestant (ask the pharmacist for a recommendation) as needed for the cough.  We will see you back in 4-6 months, sooner if needed.

## 2011-07-25 ENCOUNTER — Telehealth: Payer: Self-pay | Admitting: Pulmonary Disease

## 2011-07-25 NOTE — Telephone Encounter (Signed)
Received copies from [facility] ,on 07/25/11 . Forwarded 14pages to Dr. Kendrick Fries ,for review.

## 2011-08-27 ENCOUNTER — Ambulatory Visit: Payer: Medicare Other | Admitting: Internal Medicine

## 2011-08-28 ENCOUNTER — Ambulatory Visit (INDEPENDENT_AMBULATORY_CARE_PROVIDER_SITE_OTHER): Payer: Medicare Other | Admitting: Internal Medicine

## 2011-08-28 ENCOUNTER — Encounter: Payer: Self-pay | Admitting: Internal Medicine

## 2011-08-28 VITALS — BP 136/76 | HR 64 | Temp 98.4°F | Resp 16 | Wt 218.0 lb

## 2011-08-28 DIAGNOSIS — Z01818 Encounter for other preprocedural examination: Secondary | ICD-10-CM

## 2011-08-28 DIAGNOSIS — Z79899 Other long term (current) drug therapy: Secondary | ICD-10-CM

## 2011-08-28 DIAGNOSIS — E669 Obesity, unspecified: Secondary | ICD-10-CM

## 2011-08-28 DIAGNOSIS — G4733 Obstructive sleep apnea (adult) (pediatric): Secondary | ICD-10-CM

## 2011-08-28 DIAGNOSIS — E039 Hypothyroidism, unspecified: Secondary | ICD-10-CM

## 2011-08-28 DIAGNOSIS — I10 Essential (primary) hypertension: Secondary | ICD-10-CM

## 2011-08-28 LAB — COMPREHENSIVE METABOLIC PANEL
ALT: 11 U/L (ref 0–53)
AST: 13 U/L (ref 0–37)
Albumin: 4.3 g/dL (ref 3.5–5.2)
Alkaline Phosphatase: 62 U/L (ref 39–117)
BUN: 17 mg/dL (ref 6–23)
CO2: 29 mEq/L (ref 19–32)
Calcium: 9.6 mg/dL (ref 8.4–10.5)
Chloride: 99 mEq/L (ref 96–112)
Creatinine, Ser: 1 mg/dL (ref 0.4–1.5)
GFR: 82.18 mL/min (ref >60.00)
Glucose, Bld: 107 mg/dL — ABNORMAL HIGH (ref 70–99)
Potassium: 3.9 mEq/L (ref 3.5–5.1)
Sodium: 139 mEq/L (ref 135–145)
Total Bilirubin: 0.5 mg/dL (ref 0.3–1.2)
Total Protein: 7.1 g/dL (ref 6.0–8.3)

## 2011-08-28 NOTE — Patient Instructions (Signed)
We will repeat your fasting blood work after you have been taking the red yeast rice foe 3 months

## 2011-08-28 NOTE — Progress Notes (Signed)
Patient ID: Chase Keller, male   DOB: March 30, 1941, 71 y.o.   MRN: [id number]  Patient Active Problem List  Diagnoses  . Hypothyroidism  . Hyperlipidemia  . Obstructive sleep apnea  . Cough  . Hypertension  . Obesity (BMI 30-39.9)    Subjective:  CC:   Chief Complaint  Patient presents with  . Medical Clearance    HPI:   Chase Keller a 71 y.o. male who presents  pre operative medical clearance for left knee arthroplasty to be  done in June .  He has already seen his cardiologist Dr Chase Keller  For cardiac clearance and an ECHO was done.  He has had no history of chest pain, orthopnea, and has no history of CKD or insulin dependent diabetes. He has a history of sleep apnea controlled with CPAP.    Past Medical History  Diagnosis Date  . Hypertension   . Hyperlipidemia     Past Surgical History  Procedure Date  . Coronary artery bypass graft 1988    2 vessel,  followed by [facility]  . Ptca 2005    [name]         The following portions of the patient's history were reviewed and updated as appropriate: Allergies, current medications, and problem list.    Review of Systems:   12 Pt  review of systems was negative except those addressed in the HPI,     History   Social History  . Marital Status: Married    Spouse Name: N/A    Number of Children: 0  . Years of Education: N/A   Occupational History  . Electrician - workes PT     has had previous exposure to metals and also to agent orange   Social History Main Topics  . Smoking status: Never Smoker   . Smokeless tobacco: Never Used   Comment: father smoked in the home while pt was growing up  . Alcohol Use: No  . Drug Use: No  . Sexually Active: Not on file   Other Topics Concern  . Not on file   Social History Narrative  . No narrative on file    Objective:  BP 136/76  Pulse 64  Temp(Src) 98.4 F (36.9 C) (Oral)  Resp 16  Wt 218 lb (98.884 kg)  SpO2 97%  General appearance: alert,  cooperative and appears stated age Ears: normal TM's and external ear canals both ears Throat: lips, mucosa, and tongue normal; teeth and gums normal Neck: no adenopathy, no carotid bruit, supple, symmetrical, trachea midline and thyroid not enlarged, symmetric, no tenderness/mass/nodules Back: symmetric, no curvature. ROM normal. No CVA tenderness. Lungs: clear to auscultation bilaterally Heart: regular rate and rhythm, S1, S2 normal, no murmur, click, rub or gallop Abdomen: soft, non-tender; bowel sounds normal; no masses,  no organomegaly Pulses: 2+ and symmetric Skin: Skin color, texture, turgor normal. No rashes or lesions Lymph nodes: Cervical, supraclavicular, and axillary nodes normal.  Assessment and Plan:  Obstructive sleep apnea Wears his CPAP nightly.  Last study over 3 years ago.  Wakes up feeling refreshed most days.   Obesity (BMI 30-39.9) I have addressed  BMI and recommended a low glycemic index diet utilizing smaller more frequent meals to increase metabolism.  I have also recommended that patient start exercising with a goal of 30 minutes of aerobic exercise a minimum of 5 days per week. Screening for lipid disorders, thyroid and diabetes to be done today.    Hypothyroidism TSH will be checked preoperatively  Hypertension Well controlled on current medications.  No changes today.  Preoperative evaluation to rule out surgical contraindication He has no contraindications to surgery.     Updated Medication List Outpatient Encounter Prescriptions as of 08/28/2011  Medication Sig Dispense Refill  . albuterol (VENTOLIN HFA) 108 (90 BASE) MCG/ACT inhaler Inhale 2 puffs into the lungs every 6 (six) hours as needed for wheezing.  18 g  3  . ALFALFA PO Take by mouth.      . Calcium Carbonate-Vitamin D (CALCIUM + D) 600-200 MG-UNIT TABS Take by mouth.      . Cholecalciferol (VITAMIN D3) 5000 UNITS CHEW Chew by mouth.      . clopidogrel (PLAVIX) 75 MG tablet TAKE 1  TABLET BY MOUTH DAILY  30 tablet  6  . fish oil-omega-3 fatty acids 1000 MG capsule Take 1 g by mouth daily.        Marland Kitchen glucosamine-chondroitin 500-400 MG tablet Take 1 tablet by mouth 2 (two) times daily.        Marland Kitchen labetalol (NORMODYNE) 100 MG tablet Take 1 tablet (100 mg total) by mouth 2 (two) times daily.  60 tablet  5  . Melatonin 3 MG CAPS Take by mouth.      . niacin 500 MG tablet Take 500 mg by mouth daily with breakfast.      . OVER THE COUNTER MEDICATION B1 250 mg, B2 100mg , B6 100mg , B12 1000mg , C 1000mg , Calcium 1200mg , D 1000IU, D3 5000IU, E 400IU       . Potassium 99 MG TABS Take by mouth 2 (two) times daily.        . Red Yeast Rice 600 MG CAPS Take by mouth.      . valsartan-hydrochlorothiazide (DIOVAN-HCT) 160-25 MG per tablet Take 1 tablet by mouth daily.  30 tablet  3  . vitamin E 400 UNIT capsule Take 400 Units by mouth daily.

## 2011-08-28 NOTE — Assessment & Plan Note (Signed)
Wears his CPAP nightly.  Last study over 3 years ago.  Wakes up feeling refreshed most days.

## 2011-09-01 ENCOUNTER — Encounter: Payer: Self-pay | Admitting: Internal Medicine

## 2011-09-01 DIAGNOSIS — E669 Obesity, unspecified: Secondary | ICD-10-CM | POA: Insufficient documentation

## 2011-09-01 DIAGNOSIS — Z01818 Encounter for other preprocedural examination: Secondary | ICD-10-CM | POA: Insufficient documentation

## 2011-09-01 NOTE — Assessment & Plan Note (Signed)
He has no contraindications to surgery.

## 2011-09-01 NOTE — Assessment & Plan Note (Signed)
TSH will be checked preoperatively

## 2011-09-01 NOTE — Assessment & Plan Note (Signed)
I have addressed  BMI and recommended a low glycemic index diet utilizing smaller more frequent meals to increase metabolism.  I have also recommended that patient start exercising with a goal of 30 minutes of aerobic exercise a minimum of 5 days per week. Screening for lipid disorders, thyroid and diabetes to be done today.   

## 2011-09-01 NOTE — Assessment & Plan Note (Signed)
Well controlled on current medications.  No changes today. 

## 2011-09-15 ENCOUNTER — Encounter: Payer: Self-pay | Admitting: Physician Assistant

## 2011-09-15 ENCOUNTER — Encounter (HOSPITAL_COMMUNITY): Payer: Self-pay | Admitting: Pharmacy Technician

## 2011-09-15 ENCOUNTER — Other Ambulatory Visit: Payer: Self-pay | Admitting: Physician Assistant

## 2011-09-15 DIAGNOSIS — I2510 Atherosclerotic heart disease of native coronary artery without angina pectoris: Secondary | ICD-10-CM | POA: Insufficient documentation

## 2011-09-15 DIAGNOSIS — Z955 Presence of coronary angioplasty implant and graft: Secondary | ICD-10-CM | POA: Insufficient documentation

## 2011-09-15 DIAGNOSIS — M1712 Unilateral primary osteoarthritis, left knee: Secondary | ICD-10-CM | POA: Insufficient documentation

## 2011-09-15 DIAGNOSIS — Z951 Presence of aortocoronary bypass graft: Secondary | ICD-10-CM

## 2011-09-15 NOTE — H&P (Signed)
Chase Keller is an 71 y.o. male.   Chief Complaint: left knee end stage DJD HPI: Mr. Chase Keller is a 71 year old seen for follow-up from his significant left knee degenerative joint disease. We saw him a year ago and discussed total knee replacement with him but he had significant dentition problems which has taken a year to resolve with significant reconstructive dental surgery. He has significant left knee pain. He has pain with weightbearing it is severe at times and is worse with weightbearing it limits his activities of daily living and he cannot get in and out of a car without significant pain feels that he may fall. He's failed conservative treatment including medications injections NSAID's and bracing.   Past Medical History  Diagnosis Date  . Hypertension   . Hyperlipidemia   . Left knee DJD   . Coronary artery disease   . Stented coronary artery   . S/P coronary artery bypass graft x 2     Past Surgical History  Procedure Date  . Coronary artery bypass graft 1988    2 vessel,  followed by [facility]  . Ptca 2005    [name]    Family History  Problem Relation Age of Onset  . Heart disease Mother   . Heart attack Father   . Heart attack Brother     died of an MI after total knee  . Diabetes Brother    Social History:  reports that he has never smoked. He has never used smokeless tobacco. He reports that he does not drink alcohol or use illicit drugs.  Allergies:  Allergies  Allergen Reactions  . Ace Inhibitors Cough  . Nuvigil (Armodafinil)     headache  . Oxycodone Other (See Comments)    hallucinations  . Statins      (Not in a hospital admission)  No results found for this or any previous visit (from the past 48 hour(s)). No results found.  Review of Systems  Constitutional: Negative.   HENT: Negative.   Eyes: Negative.   Respiratory: Negative.   Cardiovascular: Negative.   Gastrointestinal: Negative.   Genitourinary: Negative.   Musculoskeletal:  Negative.   Skin: Negative.   Neurological: Negative.   Endo/Heme/Allergies: Negative.   Psychiatric/Behavioral: Negative.     Blood pressure 158/72, pulse 65, temperature 97.9 F (36.6 C), height 5' 6" (1.676 m), weight 98.431 kg (217 lb), SpO2 95.00%. Physical Exam  Constitutional: He is oriented to person, place, and time. He appears well-developed and well-nourished.  HENT:  Head: Normocephalic and atraumatic.  Mouth/Throat: Oropharynx is clear and moist.  Eyes: Conjunctivae and EOM are normal. Pupils are equal, round, and reactive to light.  Neck: Normal range of motion. Neck supple.  Cardiovascular:       Irregular rhythm at a regular rate  Respiratory: Effort normal and breath sounds normal.  GI: Soft. Bowel sounds are normal.  Genitourinary:       Not pertinent to current symptomatology therefore not examined.  Musculoskeletal:       Examination of his left knee reveals significant varus deformity decreased range of motion from -5 to 120 degrees with 2+ crepitation 1+ synovitis diffuse pain medially and laterally. Normal patella tracking. Right knee has 1+ crepitation 1+ synovitis mild varus deformity full range of motion knee is stable with normal patella tracking. He has a significant antalgic gait secondary to limping with his left leg.  Neurological: He is alert and oriented to person, place, and time. He has normal   reflexes.  Skin: Skin is warm and dry.  Psychiatric: He has a normal mood and affect. His behavior is normal. Judgment and thought content normal.     Assessment Patient Active Problem List  Diagnoses  . Hypothyroidism  . Hyperlipidemia  . Obstructive sleep apnea  . Cough  . Hypertension  . Obesity (BMI 30-39.9)  . Preoperative evaluation to rule out surgical contraindication  . Left knee DJD  . Coronary artery disease  . Stented coronary artery  . S/P coronary artery bypass graft x 2    Plan I talk to him and his wife in detail about this. I  recommend with his significant pain and lack of response to conservative care that we proceed with left total knee replacement. Discussed risks benefits and possible complications of the surgery in detail and he understands this completely. He has been  cleared preoperatively by Dr. [name] his cardiologist and his primary care physician Dr. Tullo.   Malique Driskill J 09/15/2011, 3:24 PM    

## 2011-09-17 ENCOUNTER — Inpatient Hospital Stay (HOSPITAL_COMMUNITY): Admission: RE | Admit: 2011-09-17 | Payer: Medicare Other | Source: Ambulatory Visit

## 2011-09-17 ENCOUNTER — Encounter (HOSPITAL_COMMUNITY): Payer: Self-pay

## 2011-09-17 ENCOUNTER — Inpatient Hospital Stay (HOSPITAL_COMMUNITY): Admission: RE | Admit: 2011-09-17 | Discharge: 2011-09-17 | Payer: Medicare Other | Source: Ambulatory Visit

## 2011-09-17 ENCOUNTER — Encounter (HOSPITAL_COMMUNITY): Payer: Self-pay | Admitting: *Deleted

## 2011-09-17 HISTORY — DX: Other specified postprocedural states: Z98.890

## 2011-09-17 HISTORY — DX: Sleep apnea, unspecified: G47.30

## 2011-09-17 HISTORY — DX: Cardiac murmur, unspecified: R01.1

## 2011-09-17 HISTORY — DX: Nausea with vomiting, unspecified: R11.2

## 2011-09-17 HISTORY — DX: Cardiac arrhythmia, unspecified: I49.9

## 2011-09-17 HISTORY — DX: Shortness of breath: R06.02

## 2011-09-17 HISTORY — DX: Personal history of other medical treatment: Z92.89

## 2011-09-17 HISTORY — DX: Chronic kidney disease, unspecified: N18.9

## 2011-09-19 ENCOUNTER — Ambulatory Visit (HOSPITAL_COMMUNITY): Admission: RE | Admit: 2011-09-19 | Payer: Medicare Other | Source: Ambulatory Visit

## 2011-09-19 ENCOUNTER — Encounter (HOSPITAL_COMMUNITY)
Admission: RE | Admit: 2011-09-19 | Discharge: 2011-09-19 | Disposition: A | Discharge status: Home / Self Care | Payer: Medicare Other | Source: Ambulatory Visit | Attending: Orthopedic Surgery | Admitting: Orthopedic Surgery

## 2011-09-19 DIAGNOSIS — I4891 Unspecified atrial fibrillation: Secondary | ICD-10-CM | POA: Insufficient documentation

## 2011-09-19 DIAGNOSIS — Z01818 Encounter for other preprocedural examination: Secondary | ICD-10-CM | POA: Insufficient documentation

## 2011-09-19 DIAGNOSIS — I4510 Unspecified right bundle-branch block: Secondary | ICD-10-CM | POA: Insufficient documentation

## 2011-09-19 DIAGNOSIS — Z01812 Encounter for preprocedural laboratory examination: Secondary | ICD-10-CM | POA: Insufficient documentation

## 2011-09-19 DIAGNOSIS — Z01810 Encounter for preprocedural cardiovascular examination: Secondary | ICD-10-CM | POA: Insufficient documentation

## 2011-09-19 LAB — COMPREHENSIVE METABOLIC PANEL
ALT: 13 U/L (ref 0–53)
AST: 16 U/L (ref 0–37)
Albumin: 4.2 g/dL (ref 3.5–5.2)
Alkaline Phosphatase: 59 U/L (ref 39–117)
BUN: 17 mg/dL (ref 6–23)
CO2: 29 mEq/L (ref 19–32)
Calcium: 9.9 mg/dL (ref 8.4–10.5)
Chloride: 102 mEq/L (ref 96–112)
Creatinine, Ser: 1.03 mg/dL (ref 0.50–1.35)
GFR calc Af Amer: 83 mL/min — ABNORMAL LOW (ref >90)
GFR calc non Af Amer: 72 mL/min — ABNORMAL LOW (ref >90)
Glucose, Bld: 142 mg/dL — ABNORMAL HIGH (ref 70–99)
Potassium: 3.9 mEq/L (ref 3.5–5.1)
Sodium: 141 mEq/L (ref 135–145)
Total Bilirubin: 0.7 mg/dL (ref 0.3–1.2)
Total Protein: 7.5 g/dL (ref 6.0–8.3)

## 2011-09-19 LAB — DIFFERENTIAL
Basophils Absolute: 0.1 10*3/uL (ref 0.0–0.1)
Basophils Relative: 1 % (ref 0–1)
Eosinophils Absolute: 0.4 10*3/uL (ref 0.0–0.7)
Eosinophils Relative: 5 % (ref 0–5)
Lymphocytes Relative: 23 % (ref 12–46)
Lymphs Abs: 2.1 10*3/uL (ref 0.7–4.0)
Monocytes Absolute: 0.9 10*3/uL (ref 0.1–1.0)
Monocytes Relative: 10 % (ref 3–12)
Neutro Abs: 5.8 10*3/uL (ref 1.7–7.7)
Neutrophils Relative %: 63 % (ref 43–77)

## 2011-09-19 LAB — URINALYSIS, ROUTINE W REFLEX MICROSCOPIC
Bilirubin Urine: NEGATIVE
Glucose, UA: NEGATIVE mg/dL
Hgb urine dipstick: NEGATIVE
Ketones, ur: NEGATIVE mg/dL
Nitrite: NEGATIVE
Protein, ur: NEGATIVE mg/dL
Specific Gravity, Urine: 1.015 (ref 1.005–1.030)
Urobilinogen, UA: 0.2 mg/dL (ref 0.0–1.0)
pH: 5.5 (ref 5.0–8.0)

## 2011-09-19 LAB — URINE MICROSCOPIC-ADD ON

## 2011-09-19 LAB — CBC
HCT: 44.7 % (ref 39.0–52.0)
Hemoglobin: 14.5 g/dL (ref 13.0–17.0)
MCH: 27.7 pg (ref 26.0–34.0)
MCHC: 32.4 g/dL (ref 30.0–36.0)
MCV: 85.3 fL (ref 78.0–100.0)
Platelets: 179 10*3/uL (ref 150–400)
RBC: 5.24 MIL/uL (ref 4.22–5.81)
RDW: 15.5 % (ref 11.5–15.5)
WBC: 9.3 10*3/uL (ref 4.0–10.5)

## 2011-09-19 LAB — SURGICAL PCR SCREEN
MRSA, PCR: NEGATIVE
Staphylococcus aureus: NEGATIVE

## 2011-09-19 LAB — PROTIME-INR
INR: 1.16 (ref 0.00–1.49)
Prothrombin Time: 15 seconds (ref 11.6–15.2)

## 2011-09-19 LAB — APTT: aPTT: 30 seconds (ref 24–37)

## 2011-09-19 MED ORDER — CHLORHEXIDINE GLUCONATE 4 % EX LIQD: 60.0000 mL | Freq: Once | CUTANEOUS | Status: DC

## 2011-09-19 NOTE — Pre-Procedure Instructions (Signed)
20 Chase Keller  09/19/2011   Your procedure is scheduled on:  6/24/ 2013  monday  Report to [facility] at  0700 AM.  Call this number if you have problems the morning of surgery: [phone]   Remember:   Do not eat food  DO NOT DRINK  :After Midnight. :    Take these medicines the morning of surgery with A SIP OF WATER: ALBUTEROL INHALER AND BRING WITH YOU THE DAY OF SURGERY.   LABETALOL     Do not wear jewelry, make-up or nail polish.  Do not wear lotions, powders, or perfumes. You may wear deodorant.  Do not shave 48 hours prior to surgery. Men may shave face and neck.  Do not bring valuables to the hospital.  Contacts, dentures or bridgework may not be worn into surgery.  Leave suitcase in the car. After surgery it may be brought to your room.  For patients admitted to the hospital, checkout time is 11:00 AM the day of discharge.   Patients discharged the day of surgery will not be allowed to drive home.  Name and phone number of your driver: [name]- wife [phone] home   Cell [phone]  Special Instructions: CHG Shower Use Special Wash: 1/2 bottle night before surgery and 1/2 bottle morning of surgery.   Please read over the following fact sheets that you were given: Pain Booklet, Coughing and Deep Breathing, Blood Transfusion Information, MRSA Information and Surgical Site Infection Prevention

## 2011-09-19 NOTE — Progress Notes (Signed)
PT AND WIFE REFUSED WEARING THE BLOOD BRACELET X 10 DAYS.  STATED IT IS  RIDICULOUS!   THEY HAVE  ANOTHER LAB APPOINTMENT ON Friday AT 4PM ON September 26, 2011 TO HAVE T AND S DRAWN .Marland KitchenMarland Kitchen PT HAS HAD PRIOR BLOOD TRANSFUSIONS AND ??? ANTIBODIES.Marland KitchenMarland Kitchen

## 2011-09-22 ENCOUNTER — Encounter (HOSPITAL_COMMUNITY): Payer: Self-pay | Admitting: Vascular Surgery

## 2011-09-22 NOTE — Consult Note (Signed)
Anesthesia Chart Review:  Patient is a 71 year old male scheduled for a left TKR on 09/29/11.  History includes HTN, HLD, CAD s/p CABG X 2 '88, stent '05, non-smoker, obesity with BMI 35.7, post-operative N/V, OSA with CPAP use, kidney stones, MRSA infection, SOB, dysrhythmia (not specified).  PCP is Dr. Duncan Dull who cleared him from a medical standpoint.  Cardiologist is Dr. Juliann Pares (phone [phone], fax [phone]), who by notes, has also cleared patient for this procedure as well.  CXR on 04/18/11 showed moderate enlargement of the cardiac silhouette. No pulmonary edema, pneumonia, or pleural effusion is evident. Post CABG.   CT scan of the chest from 04/24/11 [facility]) showed no acute findings, multiple indeterminate pulmonary nodules, which are 4mm of less. According to the Radiologist, If low risk for lung cancer no follow-up is recommended, but if high risk then repeat non-contrast CT in 12 months.    PFTs on 04/29/11 [facility]) showed FVC 3.27 (83% predicted), FEV1 2.66 (100% predicted).  See copy of report on chart.     Labs noted.  He is for a T&S pre-operatively (to be drawn on 09/26/11 per RN notes).  EKG showed afib @ 48 bpm, incomplete right BBB, nonspecific ST/T wave abnormality.  Currently, there are no comparison EKGs.  I have called and spoken with Nickie at Dr. Glennis Brink office re-requesting office records and asking that he confirm if patient does in fact have a history of a-fib.  If not, I will need to clarify his recommendations.  Will follow-up once I receive a response/records from Dr. Juliann Pares.  Shonna Chock, PA-C

## 2011-09-26 ENCOUNTER — Inpatient Hospital Stay (HOSPITAL_COMMUNITY): Admission: RE | Admit: 2011-09-26 | Payer: Medicare Other | Source: Ambulatory Visit

## 2011-09-26 ENCOUNTER — Other Ambulatory Visit (HOSPITAL_COMMUNITY): Payer: Medicare Other

## 2011-09-29 ENCOUNTER — Encounter (HOSPITAL_COMMUNITY): Admission: RE | Payer: Self-pay | Source: Ambulatory Visit

## 2011-09-29 ENCOUNTER — Ambulatory Visit (HOSPITAL_COMMUNITY): Admission: RE | Admit: 2011-09-29 | Payer: Medicare Other | Source: Ambulatory Visit | Admitting: Orthopedic Surgery

## 2011-09-29 HISTORY — DX: Carrier or suspected carrier of methicillin resistant Staphylococcus aureus: Z22.322

## 2011-09-29 SURGERY — ARTHROPLASTY, KNEE, TOTAL
Anesthesia: General | Laterality: Left

## 2011-10-04 ENCOUNTER — Encounter (HOSPITAL_COMMUNITY): Payer: Self-pay | Admitting: Pharmacy Technician

## 2011-10-07 ENCOUNTER — Other Ambulatory Visit: Payer: Self-pay | Admitting: Physician Assistant

## 2011-10-07 NOTE — H&P (Signed)
Chase Keller is an 71 y.o. male.   Chief Complaint: left knee end stage DJD HPI: Chase Keller is a 71 year-old male who comes in today for evaluation of his left knee.  He has a longstanding history of left knee problems and has undergone conservative care with anti-inflammatories, intraarticular Cortisone injections and bracing.  He still continues to have a significant amount of pain with this.  He was not a candidate for a total knee prior to this, as he had significant dentition issues and has undergone complete dental removal and reconstruction.  Now wears dentures and has no necrotic teeth or necrotic bone in his jaw.    Past Medical History  Diagnosis Date  . Hypertension   . Hyperlipidemia   . Left knee DJD   . Coronary artery disease   . Stented coronary artery   . S/P coronary artery bypass graft x 2   . PONV (postoperative nausea and vomiting)   . Shortness of breath   . Sleep apnea     CPAP  . Heart murmur     / 1 MD said yes, 1 said no  . Chronic kidney disease     Kidney stone- passed  . Dysrhythmia     PA for Dr Thurston Hole said yes  . History of blood transfusion     during CABG  . MRSA (methicillin resistant staph aureus) culture positive     Drainage from Left Knee approx 2005    Past Surgical History  Procedure Date  . Ptca 2005    [name]  . Bone spurs     Back  . Coronary artery bypass graft 1988    2 vessel,  followed by [facility]  . Cardiac stents 2005    Family History  Problem Relation Age of Onset  . Heart disease Mother   . Heart attack Father   . Heart attack Brother     died of an MI after total knee  . Diabetes Brother    Social History:  reports that he has never smoked. He has never used smokeless tobacco. He reports that he does not drink alcohol or use illicit drugs.  Allergies:  Allergies  Allergen Reactions  . Ace Inhibitors Cough  . Nuvigil (Armodafinil)     headache  . Oxycodone Other (See Comments)    hallucinations  . Statins       (Not in a hospital admission)  No results found for this or any previous visit (from the past 48 hour(s)). No results found.  Review of Systems  Constitutional: Negative.   HENT: Negative.   Eyes: Negative.   Respiratory: Negative.   Cardiovascular: Negative.   Gastrointestinal: Negative.   Genitourinary: Negative.   Skin: Negative.   Neurological: Negative.   Endo/Heme/Allergies: Negative.   Psychiatric/Behavioral: Negative.     Blood pressure 177/75, temperature 98 F (36.7 C), height 5\' 7"  (1.702 m), weight 98.431 kg (217 lb), SpO2 96.00%. Physical Exam  Constitutional: He is oriented to person, place, and time. He appears well-developed and well-nourished.  HENT:  Head: Normocephalic.  Mouth/Throat: Oropharynx is clear and moist.  Eyes: Conjunctivae and EOM are normal. Pupils are equal, round, and reactive to light.  Cardiovascular:  Murmur heard. Respiratory: Effort normal and breath sounds normal.  GI: Soft. Bowel sounds are normal.  Genitourinary:       Not pertinent to current symptomatology therefore not examined.  Musculoskeletal: He exhibits edema.        Left knee has a  significant varus deformity with active range of motion -5 to 120 degrees.  2+ crepitus.  1+ synovitis.  Medial and lateral joint line tenderness.  Normal patella tracking.  5/5 strength.  Right knee has active range of motion 0-120 degrees.  Mild varus deformity.  1+ crepitus.  1+ synovitis.  Normal patella tracking.  Significant antalgic gait.  No assistive devices.  He has a bilateral equal motor and sensory exam  Neurological: He is alert and oriented to person, place, and time. He has normal reflexes.  Skin: Skin is warm and dry.    X-RAYS: X-rays from Aug 07, 2011 show significant joint space narrowing bilaterally with left being worse than right.  He has periarticular spurring and subchondral sclerosis.   Assessment  Patient Active Problem List  Diagnosis  . Hypothyroidism  .  Hyperlipidemia  . Obstructive sleep apnea  . Cough  . Hypertension  . Obesity (BMI 30-39.9)  . Preoperative evaluation to rule out surgical contraindication  . Left knee DJD  . Coronary artery disease  . Stented coronary artery  . S/P coronary artery bypass graft x 2    Plan At this point in time risks, benefits and possible complications have been discussed in detail with the patient with regards to his knee replacement.  Postoperatively he is planning on going to [facility] in [place].  He has been cleared for surgery by Dr. Elijah Birk and Dr. Kathie Dike 10/07/2011, 4:56 PM

## 2011-10-08 ENCOUNTER — Encounter (HOSPITAL_COMMUNITY)
Admission: RE | Admit: 2011-10-08 | Discharge: 2011-10-08 | Disposition: A | Discharge status: Home / Self Care | Payer: Medicare Other | Source: Ambulatory Visit | Attending: Orthopedic Surgery | Admitting: Orthopedic Surgery

## 2011-10-08 ENCOUNTER — Encounter (HOSPITAL_COMMUNITY): Payer: Self-pay

## 2011-10-08 HISTORY — DX: Acute myocardial infarction, unspecified: I21.9

## 2011-10-08 HISTORY — DX: Heart failure, unspecified: I50.9

## 2011-10-08 LAB — URINE MICROSCOPIC-ADD ON

## 2011-10-08 LAB — COMPREHENSIVE METABOLIC PANEL
ALT: 19 U/L (ref 0–53)
AST: 24 U/L (ref 0–37)
Albumin: 4.3 g/dL (ref 3.5–5.2)
Alkaline Phosphatase: 58 U/L (ref 39–117)
BUN: 17 mg/dL (ref 6–23)
CO2: 27 mEq/L (ref 19–32)
Calcium: 9.7 mg/dL (ref 8.4–10.5)
Chloride: 99 mEq/L (ref 96–112)
Creatinine, Ser: 0.92 mg/dL (ref 0.50–1.35)
GFR calc Af Amer: >90 mL/min (ref >90)
GFR calc non Af Amer: 83 mL/min — ABNORMAL LOW (ref >90)
Glucose, Bld: 141 mg/dL — ABNORMAL HIGH (ref 70–99)
Potassium: 3.4 mEq/L — ABNORMAL LOW (ref 3.5–5.1)
Sodium: 139 mEq/L (ref 135–145)
Total Bilirubin: 0.8 mg/dL (ref 0.3–1.2)
Total Protein: 7.5 g/dL (ref 6.0–8.3)

## 2011-10-08 LAB — URINALYSIS, ROUTINE W REFLEX MICROSCOPIC
Bilirubin Urine: NEGATIVE
Glucose, UA: NEGATIVE mg/dL
Hgb urine dipstick: NEGATIVE
Ketones, ur: NEGATIVE mg/dL
Nitrite: NEGATIVE
Protein, ur: NEGATIVE mg/dL
Specific Gravity, Urine: 1.013 (ref 1.005–1.030)
Urobilinogen, UA: 0.2 mg/dL (ref 0.0–1.0)
pH: 5.5 (ref 5.0–8.0)

## 2011-10-08 LAB — APTT: aPTT: 29 seconds (ref 24–37)

## 2011-10-08 LAB — CBC
HCT: 44.7 % (ref 39.0–52.0)
Hemoglobin: 14.9 g/dL (ref 13.0–17.0)
MCH: 27.9 pg (ref 26.0–34.0)
MCHC: 33.3 g/dL (ref 30.0–36.0)
MCV: 83.6 fL (ref 78.0–100.0)
Platelets: 199 10*3/uL (ref 150–400)
RBC: 5.35 MIL/uL (ref 4.22–5.81)
RDW: 15.4 % (ref 11.5–15.5)
WBC: 9.4 10*3/uL (ref 4.0–10.5)

## 2011-10-08 LAB — PROTIME-INR
INR: 1.06 (ref 0.00–1.49)
Prothrombin Time: 14 seconds (ref 11.6–15.2)

## 2011-10-08 LAB — DIFFERENTIAL
Basophils Absolute: 0.1 10*3/uL (ref 0.0–0.1)
Basophils Relative: 1 % (ref 0–1)
Eosinophils Absolute: 0.5 10*3/uL (ref 0.0–0.7)
Eosinophils Relative: 5 % (ref 0–5)
Lymphocytes Relative: 26 % (ref 12–46)
Lymphs Abs: 2.4 10*3/uL (ref 0.7–4.0)
Monocytes Absolute: 1.1 10*3/uL — ABNORMAL HIGH (ref 0.1–1.0)
Monocytes Relative: 11 % (ref 3–12)
Neutro Abs: 5.4 10*3/uL (ref 1.7–7.7)
Neutrophils Relative %: 57 % (ref 43–77)

## 2011-10-08 LAB — TYPE AND SCREEN
ABO/RH(D): B POS
Antibody Screen: NEGATIVE

## 2011-10-08 LAB — ABO/RH: ABO/RH(D): B POS

## 2011-10-08 NOTE — Pre-Procedure Instructions (Signed)
20 [name]  10/08/2011   Your procedure is scheduled on:  Monday, July 8th.  Report to [facility] at 8:00 AM.  Call this number if you have problems the morning of surgery: [phone]   Remember:   Do not eat food or drink any liquid:After Midnight.   Take these medicines the morning of surgery with A SIP OF WATER :Labetaolo (Normodyne).  Use Albuterol and bring in with you the day of surgery.  Stop taking any Aspirin, NSAIDS or Herbal medications.  Stop Coumadin, Plavix and Effient.   Do not wear jewelry, make-up or nail polish.  Do not wear lotions, powders, or perfumes. You may wear deodorant.  Do not shave 48 hours prior to surgery. Men may shave face and neck.  Do not bring valuables to the hospital.  Contacts, dentures or bridgework may not be worn into surgery.  Leave suitcase in the car. After surgery it may be brought to your room.  For patients admitted to the hospital, checkout time is 11:00 AM the day of discharge.   Patients discharged the day of surgery will not be allowed to drive home.  Name and phone number of your driver: NA  Special Instructions: CHG Shower Use Special Wash: 1/2 bottle night before surgery and 1/2 bottle morning of surgery.   Please read over the following fact sheets that you were given: Pain Booklet, Coughing and Deep Breathing, Blood Transfusion Information, Total Joint Packet and Surgical Site Infection Prevention

## 2011-10-10 LAB — URINE CULTURE
Colony Count: NO GROWTH
Culture: NO GROWTH

## 2011-10-10 NOTE — Consult Note (Addendum)
Anesthesia Chart Review:  Please see my initial note from 09/22/11.  Patient is scheduled for a left TKR on 10/13/11 by Dr. Thurston Hole.  It was initially scheduled for 09/29/11, but PAT EKG showed afib.  His Cardiologist Dr. Juliann Pares confirmed this was new, and patient's surgery was postponed until he could get re-evaluated by Dr. Juliann Pares.    EKG on 10/08/11 showed persistent afib, incomplete right BBB, cannot rule out anterior infarct (age undetermined).  His HR is now 71 bpm (up from 48), otherwise I think his EKG is overall stable since 09/19/11.    He had a 2D echo at [facility] on 08/12/11 that showed LVEF 45%.  There was no comment on his valvular anatomy.    His last stress test (Dobutamine Thallium) was done on 11/28/09 and showed no ischemia, normal LV function and wall motion, EF 54%.  Dr. Juliann Pares ultimately cleared him for this procedure since patient's afib was rate controlled.  His notes indicate long-term anti-coagulation will be considered after surgery.  CXR on 04/18/11 showed moderate enlargement of the cardiac silhouette. No pulmonary edema, pneumonia, or pleural effusion is evident. Post CABG.  Labs noted.  Urine culture showed no growth.  Plan to proceed if no acute symptoms and afib remains rate controlled.  Shonna Chock, PA-C

## 2011-10-12 MED ORDER — CEFAZOLIN SODIUM-DEXTROSE 2-3 GM-% IV SOLR
2.0000 g | INTRAVENOUS | Status: AC
  Administered 2011-10-13: 2 g via INTRAVENOUS
  Filled 2011-10-12: qty 50

## 2011-10-12 MED ORDER — LACTATED RINGERS IV SOLN: INTRAVENOUS | Status: DC

## 2011-10-13 ENCOUNTER — Encounter (HOSPITAL_COMMUNITY)
Admission: RE | Disposition: A | Discharge status: Skilled Nursing Facility | Payer: Self-pay | Source: Ambulatory Visit | Attending: Orthopedic Surgery

## 2011-10-13 ENCOUNTER — Ambulatory Visit (HOSPITAL_COMMUNITY): Payer: Medicare Other | Admitting: Vascular Surgery

## 2011-10-13 ENCOUNTER — Encounter (HOSPITAL_COMMUNITY): Payer: Self-pay | Admitting: Vascular Surgery

## 2011-10-13 ENCOUNTER — Inpatient Hospital Stay (HOSPITAL_COMMUNITY)
Admission: RE | Admit: 2011-10-13 | Discharge: 2011-10-16 | DRG: 470 | Disposition: A | Discharge status: Skilled Nursing Facility | Payer: Medicare Other | Source: Ambulatory Visit | Attending: Orthopedic Surgery | Admitting: Orthopedic Surgery

## 2011-10-13 ENCOUNTER — Encounter (HOSPITAL_COMMUNITY): Payer: Self-pay | Admitting: *Deleted

## 2011-10-13 DIAGNOSIS — R001 Bradycardia, unspecified: Secondary | ICD-10-CM

## 2011-10-13 DIAGNOSIS — I252 Old myocardial infarction: Secondary | ICD-10-CM

## 2011-10-13 DIAGNOSIS — I5022 Chronic systolic (congestive) heart failure: Secondary | ICD-10-CM | POA: Diagnosis present

## 2011-10-13 DIAGNOSIS — I4891 Unspecified atrial fibrillation: Secondary | ICD-10-CM | POA: Diagnosis present

## 2011-10-13 DIAGNOSIS — E785 Hyperlipidemia, unspecified: Secondary | ICD-10-CM | POA: Diagnosis present

## 2011-10-13 DIAGNOSIS — I4510 Unspecified right bundle-branch block: Secondary | ICD-10-CM | POA: Diagnosis present

## 2011-10-13 DIAGNOSIS — D72829 Elevated white blood cell count, unspecified: Secondary | ICD-10-CM | POA: Diagnosis not present

## 2011-10-13 DIAGNOSIS — E039 Hypothyroidism, unspecified: Secondary | ICD-10-CM | POA: Diagnosis present

## 2011-10-13 DIAGNOSIS — I509 Heart failure, unspecified: Secondary | ICD-10-CM | POA: Diagnosis present

## 2011-10-13 DIAGNOSIS — I498 Other specified cardiac arrhythmias: Secondary | ICD-10-CM | POA: Diagnosis present

## 2011-10-13 DIAGNOSIS — D62 Acute posthemorrhagic anemia: Secondary | ICD-10-CM | POA: Diagnosis not present

## 2011-10-13 DIAGNOSIS — E669 Obesity, unspecified: Secondary | ICD-10-CM | POA: Diagnosis present

## 2011-10-13 DIAGNOSIS — M1710 Unilateral primary osteoarthritis, unspecified knee: Principal | ICD-10-CM | POA: Diagnosis present

## 2011-10-13 DIAGNOSIS — M1712 Unilateral primary osteoarthritis, left knee: Secondary | ICD-10-CM

## 2011-10-13 DIAGNOSIS — Z9861 Coronary angioplasty status: Secondary | ICD-10-CM

## 2011-10-13 DIAGNOSIS — Z951 Presence of aortocoronary bypass graft: Secondary | ICD-10-CM

## 2011-10-13 DIAGNOSIS — Z955 Presence of coronary angioplasty implant and graft: Secondary | ICD-10-CM

## 2011-10-13 DIAGNOSIS — I2510 Atherosclerotic heart disease of native coronary artery without angina pectoris: Secondary | ICD-10-CM

## 2011-10-13 DIAGNOSIS — I10 Essential (primary) hypertension: Secondary | ICD-10-CM | POA: Diagnosis present

## 2011-10-13 DIAGNOSIS — G4733 Obstructive sleep apnea (adult) (pediatric): Secondary | ICD-10-CM | POA: Diagnosis present

## 2011-10-13 HISTORY — PX: TOTAL KNEE ARTHROPLASTY: SHX125

## 2011-10-13 LAB — CARDIAC PANEL(CRET KIN+CKTOT+MB+TROPI)
CK, MB: 8.4 ng/mL (ref 0.3–4.0)
Relative Index: 1.9 (ref 0.0–2.5)
Total CK: 441 U/L — ABNORMAL HIGH (ref 7–232)
Troponin I: <0.3 ng/mL (ref <0.30)

## 2011-10-13 SURGERY — ARTHROPLASTY, KNEE, TOTAL
Anesthesia: General | Site: Knee | Laterality: Left | Wound class: Clean

## 2011-10-13 MED ORDER — CHLORHEXIDINE GLUCONATE 4 % EX LIQD: 60.0000 mL | Freq: Once | CUTANEOUS | Status: DC

## 2011-10-13 MED ORDER — LIDOCAINE HCL 4 % MT SOLN
OROMUCOSAL | Status: DC | PRN
  Administered 2011-10-13: 4 mL via TOPICAL

## 2011-10-13 MED ORDER — CEFUROXIME SODIUM 1.5 G IJ SOLR
INTRAMUSCULAR | Status: AC
  Filled 2011-10-13: qty 1.5

## 2011-10-13 MED ORDER — PHENOL 1.4 % MT LIQD
1.0000 | OROMUCOSAL | Status: DC | PRN
  Filled 2011-10-13: qty 177

## 2011-10-13 MED ORDER — HYDROMORPHONE HCL PF 1 MG/ML IJ SOLN
0.5000 mg | INTRAMUSCULAR | Status: DC | PRN
  Administered 2011-10-13 – 2011-10-15 (×4): 1 mg via INTRAVENOUS
  Filled 2011-10-13 (×4): qty 1

## 2011-10-13 MED ORDER — SORBITOL 70 % SOLN
30.0000 mL | Freq: Two times a day (BID) | Status: DC
  Administered 2011-10-14 – 2011-10-16 (×5): 30 mL via ORAL
  Filled 2011-10-13 (×8): qty 30

## 2011-10-13 MED ORDER — BUPIVACAINE-EPINEPHRINE 0.25% -1:200000 IJ SOLN
INTRAMUSCULAR | Status: DC | PRN
  Administered 2011-10-13: 30 mL

## 2011-10-13 MED ORDER — METOCLOPRAMIDE HCL 5 MG/ML IJ SOLN
5.0000 mg | Freq: Three times a day (TID) | INTRAMUSCULAR | Status: DC | PRN
  Administered 2011-10-13: 10 mg via INTRAVENOUS
  Filled 2011-10-13: qty 2

## 2011-10-13 MED ORDER — ONDANSETRON HCL 4 MG/2ML IJ SOLN
INTRAMUSCULAR | Status: DC | PRN
  Administered 2011-10-13 (×2): 4 mg via INTRAVENOUS

## 2011-10-13 MED ORDER — DEXTROSE 5 % IV SOLN
INTRAVENOUS | Status: DC | PRN
  Administered 2011-10-13 (×3): via INTRAVENOUS

## 2011-10-13 MED ORDER — PROPOFOL 10 MG/ML IV EMUL
INTRAVENOUS | Status: DC | PRN
  Administered 2011-10-13: 175 mg via INTRAVENOUS

## 2011-10-13 MED ORDER — SODIUM CHLORIDE 0.9 % IR SOLN
Status: DC | PRN
  Administered 2011-10-13: 1000 mL
  Administered 2011-10-13: 3000 mL

## 2011-10-13 MED ORDER — HYDROCODONE-ACETAMINOPHEN 5-325 MG PO TABS
ORAL_TABLET | ORAL | Status: AC
  Filled 2011-10-13: qty 2

## 2011-10-13 MED ORDER — ONDANSETRON HCL 4 MG PO TABS: 4.0000 mg | ORAL_TABLET | Freq: Four times a day (QID) | ORAL | Status: DC | PRN

## 2011-10-13 MED ORDER — CEFUROXIME SODIUM 1.5 G IJ SOLR
INTRAMUSCULAR | Status: DC | PRN
  Administered 2011-10-13: 1.5 g

## 2011-10-13 MED ORDER — VANCOMYCIN HCL 1000 MG IV SOLR
1000.0000 mg | INTRAVENOUS | Status: DC | PRN
  Administered 2011-10-13: 1000 mg via INTRAVENOUS

## 2011-10-13 MED ORDER — VANCOMYCIN HCL IN DEXTROSE 1-5 GM/200ML-% IV SOLN
1000.0000 mg | Freq: Two times a day (BID) | INTRAVENOUS | Status: AC
  Administered 2011-10-13: 1000 mg via INTRAVENOUS
  Filled 2011-10-13: qty 200

## 2011-10-13 MED ORDER — ONDANSETRON HCL 4 MG/2ML IJ SOLN
4.0000 mg | Freq: Once | INTRAMUSCULAR | Status: AC
  Administered 2011-10-13: 4 mg via INTRAVENOUS

## 2011-10-13 MED ORDER — HYDROMORPHONE HCL PF 1 MG/ML IJ SOLN
0.2500 mg | INTRAMUSCULAR | Status: DC | PRN
  Administered 2011-10-13 (×3): 0.5 mg via INTRAVENOUS

## 2011-10-13 MED ORDER — HYDROMORPHONE HCL PF 1 MG/ML IJ SOLN
INTRAMUSCULAR | Status: AC
  Filled 2011-10-13: qty 1

## 2011-10-13 MED ORDER — BUPIVACAINE-EPINEPHRINE PF 0.5-1:200000 % IJ SOLN
INTRAMUSCULAR | Status: DC | PRN
  Administered 2011-10-13: 20 mL

## 2011-10-13 MED ORDER — POVIDONE-IODINE 7.5 % EX SOLN: Freq: Once | CUTANEOUS | Status: DC

## 2011-10-13 MED ORDER — VANCOMYCIN HCL IN DEXTROSE 1-5 GM/200ML-% IV SOLN
INTRAVENOUS | Status: AC
  Filled 2011-10-13: qty 200

## 2011-10-13 MED ORDER — ONDANSETRON HCL 4 MG/2ML IJ SOLN
INTRAMUSCULAR | Status: AC
  Administered 2011-10-13: 4 mg via INTRAVENOUS
  Filled 2011-10-13: qty 2

## 2011-10-13 MED ORDER — ACETAMINOPHEN 650 MG RE SUPP: 650.0000 mg | Freq: Four times a day (QID) | RECTAL | Status: DC | PRN

## 2011-10-13 MED ORDER — GLYCOPYRROLATE 0.2 MG/ML IJ SOLN
INTRAMUSCULAR | Status: DC | PRN
  Administered 2011-10-13: .3 mg via INTRAVENOUS
  Administered 2011-10-13: 0.4 mg via INTRAVENOUS

## 2011-10-13 MED ORDER — ALBUTEROL SULFATE HFA 108 (90 BASE) MCG/ACT IN AERS: 2.0000 | INHALATION_SPRAY | Freq: Four times a day (QID) | RESPIRATORY_TRACT | Status: DC | PRN

## 2011-10-13 MED ORDER — ACETAMINOPHEN 325 MG PO TABS: 650.0000 mg | ORAL_TABLET | Freq: Four times a day (QID) | ORAL | Status: DC | PRN

## 2011-10-13 MED ORDER — LIDOCAINE HCL (CARDIAC) 20 MG/ML IV SOLN
INTRAVENOUS | Status: DC | PRN
  Administered 2011-10-13: 80 mg via INTRAVENOUS

## 2011-10-13 MED ORDER — LACTATED RINGERS IV SOLN
INTRAVENOUS | Status: DC | PRN
  Administered 2011-10-13 (×3): via INTRAVENOUS

## 2011-10-13 MED ORDER — IRBESARTAN 150 MG PO TABS
150.0000 mg | ORAL_TABLET | Freq: Every day | ORAL | Status: DC
  Administered 2011-10-14 – 2011-10-16 (×3): 150 mg via ORAL
  Filled 2011-10-13 (×4): qty 1

## 2011-10-13 MED ORDER — ENOXAPARIN SODIUM 30 MG/0.3ML ~~LOC~~ SOLN
30.0000 mg | Freq: Two times a day (BID) | SUBCUTANEOUS | Status: DC
  Administered 2011-10-14 – 2011-10-16 (×5): 30 mg via SUBCUTANEOUS
  Filled 2011-10-13 (×7): qty 0.3

## 2011-10-13 MED ORDER — DOCUSATE SODIUM 100 MG PO CAPS
100.0000 mg | ORAL_CAPSULE | Freq: Two times a day (BID) | ORAL | Status: DC
  Administered 2011-10-14 – 2011-10-16 (×5): 100 mg via ORAL
  Filled 2011-10-13 (×7): qty 1

## 2011-10-13 MED ORDER — ACETAMINOPHEN 10 MG/ML IV SOLN
INTRAVENOUS | Status: AC
Start: 2011-10-13 — End: 2011-10-13
  Filled 2011-10-13: qty 100

## 2011-10-13 MED ORDER — LACTATED RINGERS IV SOLN
INTRAVENOUS | Status: DC
  Administered 2011-10-13: 10:00:00 via INTRAVENOUS

## 2011-10-13 MED ORDER — ONDANSETRON HCL 4 MG/2ML IJ SOLN
4.0000 mg | Freq: Four times a day (QID) | INTRAMUSCULAR | Status: DC | PRN
  Filled 2011-10-13: qty 2

## 2011-10-13 MED ORDER — HYDROCODONE-ACETAMINOPHEN 10-325 MG PO TABS
1.0000 | ORAL_TABLET | ORAL | Status: DC | PRN
  Administered 2011-10-13 – 2011-10-14 (×2): 1 via ORAL
  Administered 2011-10-14: 2 via ORAL
  Administered 2011-10-14: 1 via ORAL
  Administered 2011-10-14 – 2011-10-15 (×4): 2 via ORAL
  Filled 2011-10-13 (×7): qty 2

## 2011-10-13 MED ORDER — ACETAMINOPHEN 10 MG/ML IV SOLN
INTRAVENOUS | Status: DC | PRN
  Administered 2011-10-13: 1000 mg via INTRAVENOUS

## 2011-10-13 MED ORDER — METOCLOPRAMIDE HCL 5 MG PO TABS
5.0000 mg | ORAL_TABLET | Freq: Three times a day (TID) | ORAL | Status: DC | PRN
  Administered 2011-10-14: 10 mg via ORAL
  Filled 2011-10-13: qty 2

## 2011-10-13 MED ORDER — NEOSTIGMINE METHYLSULFATE 1 MG/ML IJ SOLN
INTRAMUSCULAR | Status: DC | PRN
  Administered 2011-10-13: 1.5 mg via INTRAVENOUS

## 2011-10-13 MED ORDER — MENTHOL 3 MG MT LOZG
1.0000 | LOZENGE | OROMUCOSAL | Status: DC | PRN
  Filled 2011-10-13: qty 9

## 2011-10-13 MED ORDER — ONDANSETRON HCL 4 MG/2ML IJ SOLN
4.0000 mg | Freq: Once | INTRAMUSCULAR | Status: AC | PRN
  Administered 2011-10-13: 4 mg via INTRAVENOUS

## 2011-10-13 MED ORDER — VITAMIN C 500 MG PO TABS
1000.0000 mg | ORAL_TABLET | Freq: Every day | ORAL | Status: DC
  Administered 2011-10-14 – 2011-10-16 (×3): 1000 mg via ORAL
  Filled 2011-10-13 (×4): qty 2

## 2011-10-13 MED ORDER — BUPIVACAINE-EPINEPHRINE PF 0.25-1:200000 % IJ SOLN
INTRAMUSCULAR | Status: AC
  Filled 2011-10-13: qty 30

## 2011-10-13 MED ORDER — VITAMIN D3 25 MCG (1000 UNIT) PO TABS
1000.0000 [IU] | ORAL_TABLET | Freq: Every day | ORAL | Status: DC
  Administered 2011-10-14 – 2011-10-16 (×3): 1000 [IU] via ORAL
  Filled 2011-10-13 (×4): qty 1

## 2011-10-13 MED ORDER — VITAMIN D3 125 MCG (5000 UT) PO CHEW: 5000.0000 [IU] | CHEWABLE_TABLET | Freq: Every day | ORAL | Status: DC

## 2011-10-13 MED ORDER — ROCURONIUM BROMIDE 100 MG/10ML IV SOLN
INTRAVENOUS | Status: DC | PRN
  Administered 2011-10-13: 50 mg via INTRAVENOUS

## 2011-10-13 MED ORDER — FENTANYL CITRATE 0.05 MG/ML IJ SOLN
INTRAMUSCULAR | Status: DC | PRN
  Administered 2011-10-13 (×2): 50 ug via INTRAVENOUS
  Administered 2011-10-13: 100 ug via INTRAVENOUS

## 2011-10-13 MED ORDER — POTASSIUM CHLORIDE IN NACL 20-0.9 MEQ/L-% IV SOLN
INTRAVENOUS | Status: DC
  Administered 2011-10-13 – 2011-10-14 (×2): via INTRAVENOUS
  Filled 2011-10-13 (×8): qty 1000

## 2011-10-13 SURGICAL SUPPLY — 74 items
BANDAGE ESMARK 6X9 LF (GAUZE/BANDAGES/DRESSINGS) ×1 IMPLANT
BLADE SAGITTAL 25.0X1.19X90 (BLADE) ×2 IMPLANT
BLADE SAW SGTL 11.0X1.19X90.0M (BLADE) IMPLANT
BLADE SAW SGTL 13.0X1.19X90.0M (BLADE) ×2 IMPLANT
BLADE SURG 10 STRL SS (BLADE) ×4 IMPLANT
BNDG ELASTIC 6X15 VLCR STRL LF (GAUZE/BANDAGES/DRESSINGS) IMPLANT
BNDG ESMARK 6X9 LF (GAUZE/BANDAGES/DRESSINGS) ×2
BOWL SMART MIX CTS (DISPOSABLE) ×2 IMPLANT
CEMENT HV SMART SET (Cement) ×2 IMPLANT
CLOTH BEACON ORANGE TIMEOUT ST (SAFETY) ×2 IMPLANT
CLSR STERI-STRIP ANTIMIC 1/2X4 (GAUZE/BANDAGES/DRESSINGS) ×2 IMPLANT
COVER BACK TABLE 24X17X13 BIG (DRAPES) IMPLANT
COVER PROBE W GEL 5X96 (DRAPES) IMPLANT
COVER SURGICAL LIGHT HANDLE (MISCELLANEOUS) ×2 IMPLANT
CUFF TOURNIQUET SINGLE 34IN LL (TOURNIQUET CUFF) ×2 IMPLANT
CUFF TOURNIQUET SINGLE 44IN (TOURNIQUET CUFF) IMPLANT
DRAPE EXTREMITY T 121X128X90 (DRAPE) ×2 IMPLANT
DRAPE INCISE IOBAN 66X45 STRL (DRAPES) ×2 IMPLANT
DRAPE PROXIMA HALF (DRAPES) ×2 IMPLANT
DRAPE U-SHAPE 47X51 STRL (DRAPES) ×2 IMPLANT
DRSG ADAPTIC 3X8 NADH LF (GAUZE/BANDAGES/DRESSINGS) IMPLANT
DRSG PAD ABDOMINAL 8X10 ST (GAUZE/BANDAGES/DRESSINGS) IMPLANT
DURAPREP 26ML APPLICATOR (WOUND CARE) ×2 IMPLANT
ELECT CAUTERY BLADE 6.4 (BLADE) ×2 IMPLANT
ELECT REM PT RETURN 9FT ADLT (ELECTROSURGICAL) ×2
ELECTRODE REM PT RTRN 9FT ADLT (ELECTROSURGICAL) ×1 IMPLANT
EVACUATOR 1/8 PVC DRAIN (DRAIN) IMPLANT
FACESHIELD LNG OPTICON STERILE (SAFETY) ×2 IMPLANT
GLOVE BIO SURGEON STRL SZ7 (GLOVE) ×2 IMPLANT
GLOVE BIO SURGEON STRL SZ7.5 (GLOVE) ×2 IMPLANT
GLOVE BIOGEL PI IND STRL 7.0 (GLOVE) ×1 IMPLANT
GLOVE BIOGEL PI IND STRL 7.5 (GLOVE) ×1 IMPLANT
GLOVE BIOGEL PI INDICATOR 7.0 (GLOVE) ×1
GLOVE BIOGEL PI INDICATOR 7.5 (GLOVE) ×1
GLOVE ECLIPSE 7.5 STRL STRAW (GLOVE) ×2 IMPLANT
GLOVE EUDERMIC 7 POWDERFREE (GLOVE) ×2 IMPLANT
GLOVE SS BIOGEL STRL SZ 7.5 (GLOVE) ×1 IMPLANT
GLOVE SUPERSENSE BIOGEL SZ 7.5 (GLOVE) ×1
GLOVE SURG SS PI 7.0 STRL IVOR (GLOVE) ×2 IMPLANT
GOWN PREVENTION PLUS XLARGE (GOWN DISPOSABLE) ×4 IMPLANT
GOWN PREVENTION PLUS XXLARGE (GOWN DISPOSABLE) ×2 IMPLANT
GOWN STRL NON-REIN LRG LVL3 (GOWN DISPOSABLE) IMPLANT
GOWN STRL REIN XL XLG (GOWN DISPOSABLE) ×2 IMPLANT
HANDPIECE INTERPULSE COAX TIP (DISPOSABLE) ×1
HOOD PEEL AWAY FACE SHEILD DIS (HOOD) ×4 IMPLANT
IMMOBILIZER KNEE 22 UNIV (SOFTGOODS) ×2 IMPLANT
INSERT CUSHION PRONEVIEW LG (MISCELLANEOUS) ×2 IMPLANT
KIT BASIN OR (CUSTOM PROCEDURE TRAY) ×2 IMPLANT
KIT ROOM TURNOVER OR (KITS) ×2 IMPLANT
MANIFOLD NEPTUNE II (INSTRUMENTS) ×2 IMPLANT
NS IRRIG 1000ML POUR BTL (IV SOLUTION) ×2 IMPLANT
PACK TOTAL JOINT (CUSTOM PROCEDURE TRAY) ×2 IMPLANT
PAD ARMBOARD 7.5X6 YLW CONV (MISCELLANEOUS) ×4 IMPLANT
PAD CAST 4YDX4 CTTN HI CHSV (CAST SUPPLIES) IMPLANT
PADDING CAST COTTON 4X4 STRL (CAST SUPPLIES)
PADDING CAST COTTON 6X4 STRL (CAST SUPPLIES) IMPLANT
POSITIONER HEAD PRONE TRACH (MISCELLANEOUS) ×2 IMPLANT
RUBBERBAND STERILE (MISCELLANEOUS) ×2 IMPLANT
SET HNDPC FAN SPRY TIP SCT (DISPOSABLE) ×1 IMPLANT
SPONGE GAUZE 4X4 12PLY (GAUZE/BANDAGES/DRESSINGS) IMPLANT
STRIP CLOSURE SKIN 1/2X4 (GAUZE/BANDAGES/DRESSINGS) IMPLANT
SUCTION FRAZIER TIP 10 FR DISP (SUCTIONS) ×2 IMPLANT
SUT ETHIBOND NAB CT1 #1 30IN (SUTURE) ×4 IMPLANT
SUT MNCRL AB 3-0 PS2 18 (SUTURE) ×2 IMPLANT
SUT VIC AB 0 CT1 27 (SUTURE) ×3
SUT VIC AB 0 CT1 27XBRD ANBCTR (SUTURE) ×3 IMPLANT
SUT VIC AB 2-0 CT1 27 (SUTURE) ×2
SUT VIC AB 2-0 CT1 TAPERPNT 27 (SUTURE) ×2 IMPLANT
SUT VLOC 180 0 24IN GS25 (SUTURE) ×4 IMPLANT
SYR 30ML SLIP (SYRINGE) ×2 IMPLANT
TOWEL OR 17X24 6PK STRL BLUE (TOWEL DISPOSABLE) ×2 IMPLANT
TOWEL OR 17X26 10 PK STRL BLUE (TOWEL DISPOSABLE) ×2 IMPLANT
TRAY FOLEY CATH 14FR (SET/KITS/TRAYS/PACK) ×2 IMPLANT
WATER STERILE IRR 1000ML POUR (IV SOLUTION) ×4 IMPLANT

## 2011-10-13 NOTE — Significant Event (Signed)
This patient went into bradycardia ( HR 29-31) Intra op.  He was discovered to have atrial fib on his intial preop exam and EKG in early June.  His surgery was cancelled and he was sent back to his cardiologist, [name] in [place].  He subsequently cleared him for surgery without further workup in the setting of new onset afib and long standing coronary artery disease.  During surgery today his heart rate dropped to 29.  Post operatively an EKG was ordered in PACU and  Cardiology was consulted.    I have asked them to address:  1.  His cardiac status 2.  His beta blocker.  I have not reordered it. 3   His anticoagulation status.  He has not taken his plavix since June 13th, 2013.  [name] wanted him on long term anticoagulation due to his afib.   Brooksie Ellwanger A. Gwinda Passe Physician Assistant [facility] [phone]  10/13/2011, 2:05 PM

## 2011-10-13 NOTE — Progress Notes (Signed)
Rec'd call from Lab, pt has critical CKMB of 8.4, notified Jess PA, no new orders rec'd, will cont to assess

## 2011-10-13 NOTE — Consult Note (Signed)
CARDIOLOGY CONSULT NOTE  Patient ID: Chase Keller, MRN: [id number], DOB/AGE: [id number] 71 y.o. Admit date: 10/13/2011 Date of Consult: 10/13/2011  Primary Physician: Duncan Dull, MD Primary Cardiologist: [name] in [place]  Reason for Consultation: Bradycardia s/p Left Total Knee Arthroplasty   HPI: 71 y.o. male w/ PMHx significant for Atrial Fibrillation (dx 09/2011), CAD (s/p 2v CABG 1988 and PCIs '05), Chronic Systolic CHF, HTN, HLD, Hypothyroidism, Obesity, and OSA who presented to [facility] on 10/13/2011 for left knee surgery and developed intraoperative bradycardia.  According to his chart, he has a history of CAD s/p 2v CABG 1988 and PCIs '05. Per preop clearance notes recent cardiac evaluation includes: Dobutamine stress in 2011 showed no ischemia, nl LV function, no WMAs, EF 54%. Echo 08/12/11 showed EF 45%. He was planned for a left total knee replacement on 09/29/11, but was found to have new onset a.fib on preop EKG.  At that time his EKG showed A.Fib with rate 48bpm. He was seen by his cardiologist, [name] in [place] and continued on labetolol. He was not started on anticoagulation at that time. He stopped his Plavix on 09/18/11 in preparation for upcoming surgery. Subsequent EKG on 10/08/11 showed A.Fib rate 71bpm. He underwent left total knee replacement today (10/13/11) and developed bradycardia as low as 30bpm intraoperatively. Post op he has remained in the 40-60s. His BP is stable and he is asymptomatic. He reports having a "low heart rate" for years. Denies h/o syncope, dizziness, chest pain or sob.   Past Medical History  Diagnosis Date  . Hypertension   . Hyperlipidemia   . Left knee DJD   . Coronary artery disease   . Stented coronary artery   . S/P coronary artery bypass graft x 2   . PONV (postoperative nausea and vomiting)   . Sleep apnea     CPAP  . Heart murmur     / 1 MD said yes, 1 said no  . Chronic kidney disease     Kidney stone- passed    . Dysrhythmia     PA for Dr Thurston Hole said yes  . History of blood transfusion     during CABG  . MRSA (methicillin resistant staph aureus) culture positive     Drainage from Left Knee approx 2005  . Myocardial infarction     mild in 1998 per Wife   . Shortness of breath   . CHF (congestive heart failure)       Surgical History:  Past Surgical History  Procedure Date  . Ptca 2005    [name]  . Bone spurs     Back  . Coronary artery bypass graft 1988    2 vessel,  followed by [facility]  . Cardiac stents 2005  . Nasal sinus surgery     Due to sinus "impaction"     Home Meds: Medication Sig  Alfalfa 650 MG TABS Take 650 mg by mouth 2 (two) times daily.  Ascorbic Acid (VITAMIN C) 1000 MG tablet Take 1,000 mg by mouth daily.  Cholecalciferol (VITAMIN D3) 5000 UNITS CHEW Chew 5,000 Int'l Units by mouth daily.   clopidogrel (PLAVIX) 75 MG tablet Take 75 mg by mouth daily.  GLUCOSAMINE HCL-MSM PO Take 1 tablet by mouth daily.  Hyaluronic Acid-Vitamin C (HYALURONIC ACID PO) Take 100 mg by mouth 2 (two) times daily.  labetalol (NORMODYNE) 100 MG tablet Take 100 mg by mouth 2 (two) times daily.  Melatonin 3 MG CAPS Take 3 mg by  mouth at bedtime.   Omega-3 Fatty Acids (FISH OIL) 1200 MG CAPS Take 1,200 mg by mouth 2 (two) times daily.  Potassium 99 MG TABS Take 99 mg by mouth 2 (two) times daily.   Pyridoxine HCl (VITAMIN B6 PO) Take 1 tablet by mouth daily.  Red Yeast Rice 600 MG CAPS Take 600 mg by mouth 2 (two) times daily.   valsartan-hydrochlorothiazide (DIOVAN-HCT) 160-25 MG per tablet Take 1 tablet by mouth daily.  vitamin E 400 UNIT capsule Take 400 Units by mouth daily.  albuterol (PROVENTIL HFA;VENTOLIN HFA) 108 (90 BASE) MCG/ACT inhaler Inhale 2 puffs into the lungs every 6 (six) hours as needed. For shortness of breath or wheezing    Inpatient Medications:    .  ceFAZolin (ANCEF) IV  2 g Intravenous 60 min Pre-Op  . chlorhexidine  60 mL Topical Once  . chlorhexidine   60 mL Topical Once  . chlorhexidine  60 mL Topical Once  . HYDROmorphone      . HYDROmorphone      . ondansetron  4 mg Intravenous Once  . povidone-iodine   Topical Once  . povidone-iodine   Topical Once      . lactated ringers    . lactated ringers 20 mL/hr at 10/13/11 0941    Allergies:  Allergies  Allergen Reactions  . Ace Inhibitors Cough  . Nuvigil (Armodafinil)     headache  . Oxycodone Other (See Comments)    hallucinations  . Statins     History   Social History  . Marital Status: Married    Spouse Name: N/A    Number of Children: 0  . Years of Education: N/A   Occupational History  . Electrician - workes PT     has had previous exposure to metals and also to agent orange   Social History Main Topics  . Smoking status: Never Smoker   . Smokeless tobacco: Never Used   Comment: father smoked in the home while pt was growing up  . Alcohol Use: No  . Drug Use: No  . Sexually Active: Not on file   Other Topics Concern  . Not on file   Social History Narrative  . No narrative on file     Family History  Problem Relation Age of Onset  . Heart disease Mother   . Heart attack Father   . Heart attack Brother     died of an MI after total knee  . Diabetes Brother      Review of Systems: General: negative for chills, fever, night sweats or weight changes.  Cardiovascular: negative for chest pain, shortness of breath, dyspnea on exertion, edema, orthopnea, palpitations, or paroxysmal nocturnal dyspnea Dermatological: negative for rash Respiratory: negative for cough or wheezing Urologic: negative for hematuria Abdominal: negative for nausea, vomiting, diarrhea, bright red blood per rectum, melena, or hematemesis Neurologic: negative for visual changes, syncope, or dizziness Musculoskeletal: Left knee pain All other systems reviewed and are otherwise negative except as noted above.  Labs:  Lab Results  Component Value Date   WBC 9.4 10/08/2011    HGB 14.9 10/08/2011   HCT 44.7 10/08/2011   MCV 83.6 10/08/2011   PLT 199 10/08/2011    Lab 10/08/11 1351  NA 139  K 3.4*  CL 99  CO2 27  BUN 17  CREATININE 0.92  CALCIUM 9.7  PROT 7.5  BILITOT 0.8  ALKPHOS 58  ALT 19  AST 24  GLUCOSE 141*    Radiology/Studies:  None  EKG: 10/13/11 @ 1425 - Atrial fibrillation 51bpm, Incomplete RBBB, nonspecific ST/T wave abnormality (unchanged from prior EKG)  Physical Exam: Blood pressure 143/79, pulse 39, temperature 96.9 F (36.1 C), temperature source Oral, resp. rate 12, SpO2 94.00%. General: Overweight elderly white male, in no acute distress. Head: Normocephalic, atraumatic, sclera non-icteric, no xanthomas, nares are without discharge.  Neck: Supple. Negative for carotid bruits. JVD not elevated. Lungs: Clear bilaterally to auscultation without wheezes, rales, or rhonchi. Breathing is unlabored. Heart: Irregular, bradycardic with S1 S2. 2/6 systolic murmur at apex. No rubs or gallops appreciated. Abdomen: Soft, non-tender, non-distended with normoactive bowel sounds. No hepatomegaly. No rebound/guarding. No obvious abdominal masses. Msk:  Strength and tone appear normal for age. Extremities: Left leg in post op brace; No clubbing or cyanosis. No edema.  Distal pedal pulses are intact and equal bilaterally. Neuro: Somnolent and oriented X 3. Moves all extremities spontaneously. Psych:  Responds to questions appropriately with a normal affect.   Assessment and Plan:  71 y.o. male w/ PMHx significant for Atrial Fibrillation (dx 09/2011), CAD (s/p 2v CABG 1988 and PCIs '05), Chronic Systolic CHF, HTN, HLD, Hypothyroidism, Obesity, and OSA who presented to [facility] on 10/13/2011 for left knee surgery and developed intraoperative bradycardia.  1. Atrial Fibrillation with slow ventricular response: Patient diagnosed with A.fib in early June during preop evaluation for total left knee surgery. No prior history of atrial arrhythmias. EKG at  that time showed rate of 48bpm  He has been on Labetolol "for years" and reports long standing history of "low heart rates". He was not initiated on anticoagulation by his primary cardiologist, likely due to need for knee surgery. CHADS2 score 2 (CHF and HTN). He is currently asymptomatic with rates 40-60s. BP stable.  Will need chronic anticoagulation for primary stroke prevention given his CHADS2 score (coumadin vs newer anticoagulant). Cont to hold labetolol and monitor on telemetry. Start coumadin once ok with surgery. No indication for PPM at this time.   2. CAD: S/p 2v CABG 1988 and PCIs '05. According to preop notes he had normal stress in 2011 and Echo without WMAs in 08/2011.  Plavix stopped on 09/18/11 in preparation for surgery. No anginal symptoms. Cycle enzymes. Would continue off plavix for now given post op status and need for anticoagulation for a.fib. Leave decision of eventual resumption up to his primary cardiologist.  Chronic Systolic CHF: According to notes Echo 08/2011 revealed EF 45%. Appears euvolemic on exam. Cont Diovan. Hold BB.    Signed, HOPE, JESSICA PA-C 10/13/2011, 3:07 PM  Patient examined Chart reviewed.  Asymptomatic post op cardiac status.  Chronic afib with marginally slow rates periop.  Labatolol on hold.  Start heparin and coumadin for afib and DVT prophylaxis post TKR when ok with surgery.  May need to resume lower dose of labatolol once he starts PT and is more active.  If he stays on the slow side and BP up start ACE.  Charlton Haws 4:22 PM 10/13/2011

## 2011-10-13 NOTE — Preoperative (Signed)
Beta Blockers   Pt took Labetolol 100 mg @ 6:15 am 10/13/11

## 2011-10-13 NOTE — Anesthesia Postprocedure Evaluation (Signed)
Anesthesia Post Note  Patient: Chase Keller  Procedure(s) Performed: Procedure(s) (LRB): TOTAL KNEE ARTHROPLASTY (Left)  Anesthesia type: general  Patient location: PACU  Post pain: Pain level controlled  Post assessment: Patient's Cardiovascular Status Stable  Last Vitals:  Filed Vitals:   10/13/11 1433  BP: 143/79  Pulse: 39  Temp:   Resp: 12    Post vital signs: Reviewed and stable  Level of consciousness: sedated  Complications: No apparent anesthesia complications

## 2011-10-13 NOTE — Progress Notes (Signed)
Pt placed on home CPAP at 2225 with a pressure of 20. Tolerating well HR 68 SpO2 97

## 2011-10-13 NOTE — Interval H&P Note (Signed)
History and Physical Interval Note:  10/13/2011 11:03 AM  Chase Keller  has presented today for surgery, with the diagnosis of DJD LEFT KNEE  The various methods of treatment have been discussed with the patient and family. After consideration of risks, benefits and other options for treatment, the patient has consented to  Procedure(s) (LRB): TOTAL KNEE ARTHROPLASTY (Left) as a surgical intervention .  The patient's history has been reviewed, patient examined, no change in status, stable for surgery.  I have reviewed the patients' chart and labs.  Questions were answered to the patient's satisfaction.     [name]

## 2011-10-13 NOTE — Anesthesia Preprocedure Evaluation (Addendum)
Anesthesia Evaluation  Patient identified by MRN, date of birth, ID band Patient awake    Reviewed: Allergy & Precautions, H&P , NPO status , Patient's Chart, lab work & pertinent test results  Airway Mallampati: II TM Distance: >3 FB     Dental  (+) Edentulous Upper and Edentulous Lower   Pulmonary sleep apnea and Continuous Positive Airway Pressure Ventilation ,  C-Pap @ 21 cm h2o   Pulmonary exam normal       Cardiovascular Exercise Tolerance: Poor hypertension, Pt. on home beta blockers and Pt. on medications + CAD, + Past MI and +CHF + dysrhythmias Atrial Fibrillation Rhythm:Irregular Rate:Normal     Neuro/Psych    GI/Hepatic negative GI ROS, Neg liver ROS,   Endo/Other  negative endocrine ROSHypothyroidism   Renal/GU      Musculoskeletal  (+) Arthritis -, Osteoarthritis,    Abdominal   Peds  Hematology negative hematology ROS (+)   Anesthesia Other Findings   Reproductive/Obstetrics negative OB ROS                         Anesthesia Physical Anesthesia Plan  ASA: III  Anesthesia Plan: General   Post-op Pain Management:    Induction: Intravenous  Airway Management Planned: LMA and Oral ETT  Additional Equipment:   Intra-op Plan:   Post-operative Plan: Extubation in OR  Informed Consent: I have reviewed the patients History and Physical, chart, labs and discussed the procedure including the risks, benefits and alternatives for the proposed anesthesia with the patient or authorized representative who has indicated his/her understanding and acceptance.   Dental advisory given  Plan Discussed with: CRNA, Anesthesiologist and Surgeon  Anesthesia Plan Comments:        Anesthesia Quick Evaluation

## 2011-10-13 NOTE — Op Note (Signed)
MRN:     [id number] DOB/AGE:    04/22/1940 / 71 y.o.       OPERATIVE REPORT    DATE OF PROCEDURE:  10/13/2011       PREOPERATIVE DIAGNOSIS:   DJD LEFT KNEE      Estimated Body mass index is 35.65 kg/(m^2) as calculated from the following:   Height as of 09/19/11: 5\' 6" (1.676 m).   Weight as of 09/19/11: 220 lb 14.4 oz(100.2 kg).                                                        POSTOPERATIVE DIAGNOSIS:   DJD LEFT KNEE                                                                      PROCEDURE:  Procedure(s): TOTAL KNEE ARTHROPLASTY Using Depuy Sigma RP implants #3 Femur, #4Tibia, 12.60mm sigma RP bearing, 35 Patella     SURGEON: Kla Bily A    ASSISTANT:  Kirstin Shepperson PA-C   (Present and scrubbed throughout the case, critical for assistance with exposure, retraction, instrumentation, and closure.)         ANESTHESIA: GET with Femoral Nerve Block  DRAINS: foley, 2 medium hemovac in knee   TOURNIQUET TIME:   COMPLICATIONS:  None     SPECIMENS: None   INDICATIONS FOR PROCEDURE: The patient has  DJD LEFT KNEE, varus deformities, XR shows bone on bone arthritis. Patient has failed all conservative measures including anti-inflammatory medicines, narcotics, attempts at  exercise and weight loss, cortisone injections and viscosupplementation.  Risks and benefits of surgery have been discussed, questions answered.   DESCRIPTION OF PROCEDURE: The patient identified by armband, received  right femoral nerve block and IV antibiotics, in the holding area at [facility]. Patient taken to the operating room, appropriate anesthetic  monitors were attached General endotracheal anesthesia induced with  the patient in supine position, Foley catheter was inserted. Tourniquet  applied high to the operative thigh. Lateral post and foot positioner  applied to the table, the lower extremity was then prepped and draped  in usual sterile fashion from the ankle to the  tourniquet. Time-out procedure was performed. The limb was wrapped with an Esmarch bandage and the tourniquet inflated to 365 mmHg. We began the operation by making the anterior midline incision starting at handbreadth above the patella going over the patella 1 cm medial to and  4 cm distal to the tibial tubercle. Small bleeders in the skin and the  subcutaneous tissue identified and cauterized. Transverse retinaculum was incised and reflected medially and a medial parapatellar arthrotomy was accomplished. the patella was everted and theprepatellar fat pad resected. The superficial medial collateral  ligament was then elevated from anterior to posterior along the proximal  flare of the tibia and anterior half of the menisci resected. The knee was hyperflexed exposing bone on bone arthritis. Peripheral and notch osteophytes as well as the cruciate ligaments were then resected. We continued to  work our way around posteriorly along the proximal tibia, and externally  rotated  the tibia subluxing it out from underneath the femur. A McHale  retractor was placed through the notch and a lateral Hohmann retractor  placed, and we then drilled through the proximal tibia in line with the  axis of the tibia followed by an intramedullary guide rod and 2-degree  posterior slope cutting guide. The tibial cutting guide was pinned into place  allowing resection of 4 mm of bone medially and about 8 mm of bone  laterally because of her varus deformity. Satisfied with the tibial resection, we then  entered the distal femur 2 mm anterior to the PCL origin with the  intramedullary guide rod and applied the distal femoral cutting guide  set at 11mm, with 5 degrees of valgus. This was pinned along the  epicondylar axis. At this point, the distal femoral cut was accomplished without difficulty. We then sized for a #3 femoral component and pinned the guide in 3 degrees of external rotation.The chamfer cutting guide was pinned  into place. The anterior, posterior, and chamfer cuts were accomplished without difficulty followed by  the Sigma RP box cutting guide and the box cut. We also removed posterior osteophytes from the posterior femoral condyles. At this  time, the knee was brought into full extension. We checked our  extension and flexion gaps and found them symmetric at 12.63mm.  The patella thickness measured at 26 mm. We set the cutting guide at 17 and removed the posterior 9.5-10 mm  of the patella sized for 35 button and drilled the lollipop. The knee  was then once again hyperflexed exposing the proximal tibia. We sized for a #4 tibial base plate, applied the smokestack and the conical reamer followed by the the Delta fin keel punch. We then hammered into place the Sigma RP trial femoral component, inserted a 12.5-mm trial bearing, trial patellar button, and took the knee through range of motion from 0-130 degrees. No thumb pressure was required for patellar  tracking. At this point, all trial components were removed, a double batch of DePuy HV cement with 1500 mg of Zinacef was mixed and applied to all bony metallic mating surfaces except for the posterior condyles of the femur itself. In order, we  hammered into place the tibial tray and removed excess cement, the femoral component and removed excess cement, a 12.5-mm Sigma RP bearing  was inserted, and the knee brought to full extension with compression.  The patellar button was clamped into place, and excess cement  removed. While the cement cured the wound was irrigated out with normal saline solution pulse lavage, and medium Hemovac drains were placed.. Ligament stability and patellar tracking were checked and found to be excellent. The tourniquet was then released and hemostasis was obtained with cautery. The parapatellar arthrotomy was closed with  #1 ethibond suture. The subcutaneous tissue with 0 and 2-0 undyed  Vicryl suture, and 4-0 Monocryl.. A dressing  of Xeroform,  4 x 4, dressing sponges, Webril, and Ace wrap applied. Needle and sponge count were correct times 2.The patient awakened, extubated, and taken to recovery room without difficulty. Vascular status was normal, pulses 2+ and symmetric.   Regla Fitzgibbon A 10/13/2011, 1:01 PM

## 2011-10-13 NOTE — Transfer of Care (Signed)
Immediate Anesthesia Transfer of Care Note  Patient: Chase Keller  Procedure(s) Performed: Procedure(s) (LRB): TOTAL KNEE ARTHROPLASTY (Left)  Patient Location: PACU  Anesthesia Type: General  Level of Consciousness: awake, alert , oriented, sedated and patient cooperative  Airway & Oxygen Therapy: Patient Spontanous Breathing and Patient connected to nasal cannula oxygen  Post-op Assessment: Report given to PACU RN and Post -op Vital signs reviewed and stable  Post vital signs: Reviewed and stable  Complications: No apparent anesthesia complications

## 2011-10-13 NOTE — H&P (View-Only) (Signed)
Chase Keller is an 71 y.o. male.   Chief Complaint: left knee end stage DJD HPI: Chase Keller is a 71 year old seen for follow-up from his significant left knee degenerative joint disease. We saw him a year ago and discussed total knee replacement with him but he had significant dentition problems which has taken a year to resolve with significant reconstructive dental surgery. He has significant left knee pain. He has pain with weightbearing it is severe at times and is worse with weightbearing it limits his activities of daily living and he cannot get in and out of a car without significant pain feels that he may fall. He's failed conservative treatment including medications injections NSAID's and bracing.   Past Medical History  Diagnosis Date  . Hypertension   . Hyperlipidemia   . Left knee DJD   . Coronary artery disease   . Stented coronary artery   . S/P coronary artery bypass graft x 2     Past Surgical History  Procedure Date  . Coronary artery bypass graft 1988    2 vessel,  followed by [facility]  . Ptca 2005    [name]    Family History  Problem Relation Age of Onset  . Heart disease Mother   . Heart attack Father   . Heart attack Brother     died of an MI after total knee  . Diabetes Brother    Social History:  reports that he has never smoked. He has never used smokeless tobacco. He reports that he does not drink alcohol or use illicit drugs.  Allergies:  Allergies  Allergen Reactions  . Ace Inhibitors Cough  . Nuvigil (Armodafinil)     headache  . Oxycodone Other (See Comments)    hallucinations  . Statins      (Not in a hospital admission)  No results found for this or any previous visit (from the past 48 hour(s)). No results found.  Review of Systems  Constitutional: Negative.   HENT: Negative.   Eyes: Negative.   Respiratory: Negative.   Cardiovascular: Negative.   Gastrointestinal: Negative.   Genitourinary: Negative.   Musculoskeletal:  Negative.   Skin: Negative.   Neurological: Negative.   Endo/Heme/Allergies: Negative.   Psychiatric/Behavioral: Negative.     Blood pressure 158/72, pulse 65, temperature 97.9 F (36.6 C), height 5\' 6"  (1.676 m), weight 98.431 kg (217 lb), SpO2 95.00%. Physical Exam  Constitutional: He is oriented to person, place, and time. He appears well-developed and well-nourished.  HENT:  Head: Normocephalic and atraumatic.  Mouth/Throat: Oropharynx is clear and moist.  Eyes: Conjunctivae and EOM are normal. Pupils are equal, round, and reactive to light.  Neck: Normal range of motion. Neck supple.  Cardiovascular:       Irregular rhythm at a regular rate  Respiratory: Effort normal and breath sounds normal.  GI: Soft. Bowel sounds are normal.  Genitourinary:       Not pertinent to current symptomatology therefore not examined.  Musculoskeletal:       Examination of his left knee reveals significant varus deformity decreased range of motion from -5 to 120 degrees with 2+ crepitation 1+ synovitis diffuse pain medially and laterally. Normal patella tracking. Right knee has 1+ crepitation 1+ synovitis mild varus deformity full range of motion knee is stable with normal patella tracking. He has a significant antalgic gait secondary to limping with his left leg.  Neurological: He is alert and oriented to person, place, and time. He has normal  reflexes.  Skin: Skin is warm and dry.  Psychiatric: He has a normal mood and affect. His behavior is normal. Judgment and thought content normal.     Assessment Patient Active Problem List  Diagnoses  . Hypothyroidism  . Hyperlipidemia  . Obstructive sleep apnea  . Cough  . Hypertension  . Obesity (BMI 30-39.9)  . Preoperative evaluation to rule out surgical contraindication  . Left knee DJD  . Coronary artery disease  . Stented coronary artery  . S/P coronary artery bypass graft x 2    Plan I talk to him and his wife in detail about this. I  recommend with his significant pain and lack of response to conservative care that we proceed with left total knee replacement. Discussed risks benefits and possible complications of the surgery in detail and he understands this completely. He has been  cleared preoperatively by Dr. Juliann Pares his cardiologist and his primary care physician Dr. [name].   Pascal Lux 09/15/2011, 3:24 PM

## 2011-10-13 NOTE — Progress Notes (Signed)
2nd request made for 12 lead EKG [facility]  Cardiology  [place].

## 2011-10-13 NOTE — Progress Notes (Signed)
Rec'd report from Jamie RN, assuming care of patient at this time    

## 2011-10-13 NOTE — Anesthesia Procedure Notes (Addendum)
Anesthesia Regional Block:  Femoral nerve block  Pre-Anesthetic Checklist: ,, timeout performed, Correct Patient, Correct Site, Correct Laterality, Correct Procedure, Correct Position, site marked, Risks and benefits discussed,  Surgical consent,  Pre-op evaluation,  At surgeon's request and post-op pain management  Laterality: Left  Prep: Maximum Sterile Barrier Precautions used, chloraprep and alcohol swabs       Needles:  Injection technique: Single-shot  Needle Type: Stimulator Needle - 80        Needle insertion depth: 6 cm   Additional Needles:  Procedures: nerve stimulator Femoral nerve block  Nerve Stimulator or Paresthesia:  Response: 0.5 mA, 0.1 ms, 6 cm  Additional Responses:   Narrative:  Start time: 10/13/2011 9:45 AM End time: 10/13/2011 9:50 AM Injection made incrementally with aspirations every 5 mL.  Performed by: Personally  Anesthesiologist: gREGORY e sMITH md  Additional Notes: 20CC 0.5% mARCAINE w/ epi w/o difficulty or discomfort GES   Procedure Name: Intubation Date/Time: 10/13/2011 11:22 AM Performed by: Tyrone Nine Pre-anesthesia Checklist: Emergency Drugs available, Suction available, Patient being monitored, Patient identified and Timeout performed Patient Re-evaluated:Patient Re-evaluated prior to inductionOxygen Delivery Method: Circle system utilized Preoxygenation: Pre-oxygenation with 100% oxygen Intubation Type: IV induction Ventilation: Mask ventilation without difficulty and Oral airway inserted - appropriate to patient size Laryngoscope Size: Mac and 3 Grade View: Grade I Tube type: Oral Tube size: 7.5 mm Number of attempts: 1 Airway Equipment and Method: Stylet Placement Confirmation: ETT inserted through vocal cords under direct vision,  breath sounds checked- equal and bilateral and positive ETCO2 Secured at: 22 cm Tube secured with: Tape Dental Injury: Teeth and Oropharynx as per pre-operative assessment

## 2011-10-13 NOTE — Progress Notes (Signed)
Orthopedic Tech Progress Note Patient Details:  Chase Keller [birth year] [id number]  CPM Left Knee CPM Left Knee: On Left Knee Flexion (Degrees): 60  Left Knee Extension (Degrees): 0  Additional Comments: trapeze bar   Cammer, Mickie Bail 10/13/2011, 3:13 PM

## 2011-10-14 ENCOUNTER — Encounter (HOSPITAL_COMMUNITY): Payer: Self-pay | Admitting: *Deleted

## 2011-10-14 DIAGNOSIS — I10 Essential (primary) hypertension: Secondary | ICD-10-CM

## 2011-10-14 DIAGNOSIS — I4891 Unspecified atrial fibrillation: Secondary | ICD-10-CM

## 2011-10-14 DIAGNOSIS — I2510 Atherosclerotic heart disease of native coronary artery without angina pectoris: Secondary | ICD-10-CM

## 2011-10-14 DIAGNOSIS — I4510 Unspecified right bundle-branch block: Secondary | ICD-10-CM | POA: Diagnosis present

## 2011-10-14 DIAGNOSIS — D72829 Elevated white blood cell count, unspecified: Secondary | ICD-10-CM | POA: Diagnosis not present

## 2011-10-14 LAB — CARDIAC PANEL(CRET KIN+CKTOT+MB+TROPI)
CK, MB: 10.1 ng/mL (ref 0.3–4.0)
CK, MB: 7.5 ng/mL (ref 0.3–4.0)
Relative Index: 2.2 (ref 0.0–2.5)
Relative Index: 2.3 (ref 0.0–2.5)
Total CK: 345 U/L — ABNORMAL HIGH (ref 7–232)
Total CK: 437 U/L — ABNORMAL HIGH (ref 7–232)
Troponin I: <0.3 ng/mL (ref <0.30)
Troponin I: <0.3 ng/mL (ref <0.30)

## 2011-10-14 LAB — CBC
HCT: 40.4 % (ref 39.0–52.0)
Hemoglobin: 13 g/dL (ref 13.0–17.0)
MCH: 27.4 pg (ref 26.0–34.0)
MCHC: 32.2 g/dL (ref 30.0–36.0)
MCV: 85.1 fL (ref 78.0–100.0)
Platelets: 183 10*3/uL (ref 150–400)
RBC: 4.75 MIL/uL (ref 4.22–5.81)
RDW: 15.4 % (ref 11.5–15.5)
WBC: 12.7 10*3/uL — ABNORMAL HIGH (ref 4.0–10.5)

## 2011-10-14 LAB — BASIC METABOLIC PANEL
BUN: 20 mg/dL (ref 6–23)
CO2: 30 mEq/L (ref 19–32)
Calcium: 8.7 mg/dL (ref 8.4–10.5)
Chloride: 99 mEq/L (ref 96–112)
Creatinine, Ser: 1.13 mg/dL (ref 0.50–1.35)
GFR calc Af Amer: 74 mL/min — ABNORMAL LOW (ref >90)
GFR calc non Af Amer: 64 mL/min — ABNORMAL LOW (ref >90)
Glucose, Bld: 115 mg/dL — ABNORMAL HIGH (ref 70–99)
Potassium: 3.5 mEq/L (ref 3.5–5.1)
Sodium: 140 mEq/L (ref 135–145)

## 2011-10-14 MED ORDER — SODIUM CHLORIDE 0.9 % IV BOLUS (SEPSIS): 500.0000 mL | Freq: Once | INTRAVENOUS | Status: DC

## 2011-10-14 MED ORDER — WARFARIN SODIUM 7.5 MG PO TABS
7.5000 mg | ORAL_TABLET | Freq: Once | ORAL | Status: AC
  Administered 2011-10-14: 7.5 mg via ORAL
  Filled 2011-10-14: qty 1

## 2011-10-14 MED ORDER — WARFARIN VIDEO
Freq: Once | Status: AC
  Administered 2011-10-16: 10:00:00

## 2011-10-14 MED ORDER — WARFARIN - PHARMACIST DOSING INPATIENT: Freq: Every day | Status: DC

## 2011-10-14 MED ORDER — COUMADIN BOOK
Freq: Once | Status: AC
  Administered 2011-10-14: 14:00:00
  Filled 2011-10-14: qty 1

## 2011-10-14 NOTE — Clinical Social Work Placement (Addendum)
    Clinical Social Work Department CLINICAL SOCIAL WORK PLACEMENT NOTE 10/14/2011  Patient:  Chase Keller, Chase Keller  Account Number:  [id number] Admit date:  10/13/2011  Clinical Social Worker:  Hulan Fray  Date/time:  10/14/2011 10:32 AM  Clinical Social Work is seeking post-discharge placement for this patient at the following level of care:   SKILLED NURSING   (*CSW will update this form in Epic as items are completed)   10/14/2011  Patient/family provided with [facility] Department of Clinical Social Work's list of facilities offering this level of care within the geographic area requested by the patient (or if unable, by the patient's family).  10/14/2011  Patient/family informed of their freedom to choose among providers that offer the needed level of care, that participate in Medicare, Medicaid or managed care program needed by the patient, have an available bed and are willing to accept the patient.  10/14/2011  Patient/family informed of [facility]' ownership interest in [facility], as well as of the fact that they are under no obligation to receive care at this facility.  PASARR submitted to EDS on 10/14/2011 PASARR number received from EDS on 10/14/2011  FL2 transmitted to all facilities in geographic area requested by pt/family on  10/14/2011 FL2 transmitted to all facilities within larger geographic area on   Patient informed that his/her managed care company has contracts with or will negotiate with  certain facilities, including the following:     Patient/family informed of bed offers received:  10/14/2011 Patient chooses bed at [facility]  Physician recommends and patient chooses bed at    Patient to be transferred to [facility] on  10/16/2011 Patient to be transferred to facility by ambulance [facility])  The following physician request were entered in Epic:   Additional Comments: Patient's wife is preferrable to [facility] if they have a private  room available.  10/16/11- Private room available.  DC today to SNF. Patient and wife are very pleased.  Wife is very concerned that patient will be on Coumadin for at least a month and she wanted to talk to a Dietician prior to d/c. Unable to reach Dietician today despite multiple attempts.  Wife will follow up with Dietician at nursing center.  Spoke to [place] at [facility]- bed is available. Notified pt's nurse Beth of above.

## 2011-10-14 NOTE — Evaluation (Signed)
Physical Therapy Evaluation Patient Details Name: Chase Keller MRN: [id number] DOB: 06/16/1940 Today's Date: 10/14/2011 Time: 5284-1324 PT Time Calculation (min): 19 min  PT Assessment / Plan / Recommendation Clinical Impression  Pt adm for Lt TKA.  Pt with intraoperative bradycardia.  Pt limited this AM by nausea.  Should make good progress and be able to return home with wife and HHPT.    PT Assessment  Patient needs continued PT services    Follow Up Recommendations  Home health PT    Barriers to Discharge        Equipment Recommendations  Rolling walker with 5" wheels    Recommendations for Other Services     Frequency 7X/week    Precautions / Restrictions Precautions Precautions: Fall;Knee Required Braces or Orthoses: Knee Immobilizer - Left Knee Immobilizer - Left: Discontinue once straight leg raise with < 10 degree lag;On when out of bed or walking Restrictions Weight Bearing Restrictions: Yes LLE Weight Bearing: Weight bearing as tolerated   Pertinent Vitals/Pain 3/10 pain lt knee      Mobility  Bed Mobility Bed Mobility: Supine to Sit;Sitting - Scoot to Edge of Bed Supine to Sit: 4: Min assist;HOB elevated;With rails Sitting - Scoot to Edge of Bed: 4: Min assist Details for Bed Mobility Assistance: Assist for LLE and to raise trunk. Transfers Transfers: Sit to Stand;Stand to Sit Sit to Stand: 4: Min assist;With upper extremity assist;From bed Stand to Sit: 4: Min assist;With upper extremity assist;With armrests;To chair/3-in-1 Ambulation/Gait Ambulation/Gait Assistance: 4: Min assist Ambulation Distance (Feet): 3 Feet Assistive device: Rolling walker Ambulation/Gait Assistance Details: Amb limited due to nausea. Gait Pattern: Step-to pattern    Exercises Total Joint Exercises Ankle Circles/Pumps: Both;10 reps;Seated Quad Sets: 5 reps;Supine;Left   PT Diagnosis: Difficulty walking;Acute pain  PT Problem List: Decreased strength;Decreased range of  motion;Decreased knowledge of use of DME;Decreased activity tolerance;Decreased mobility PT Treatment Interventions: DME instruction;Gait training;Functional mobility training;Patient/family education;Therapeutic activities;Therapeutic exercise   PT Goals Acute Rehab PT Goals PT Goal Formulation: With patient Time For Goal Achievement: 10/21/11 Potential to Achieve Goals: Good Pt will go Supine/Side to Sit: with modified independence PT Goal: Supine/Side to Sit - Progress: Goal set today Pt will go Sit to Supine/Side: with modified independence PT Goal: Sit to Supine/Side - Progress: Goal set today Pt will go Sit to Stand: with modified independence PT Goal: Sit to Stand - Progress: Goal set today Pt will go Stand to Sit: with modified independence PT Goal: Stand to Sit - Progress: Goal set today Pt will Ambulate: 51 - 150 feet;with supervision;with modified independence PT Goal: Ambulate - Progress: Goal set today  Visit Information  Last PT Received On: 10/14/11 Assistance Needed: +1    Subjective Data  Subjective: "I hoped this wouldn't happen again," pt stated about nausea. 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 ROM NT due to nausea.)   Balance Static Standing Balance Static Standing - Balance Support: Bilateral upper extremity supported (on walker) Static Standing - Level of Assistance: 5: Stand by assistance  End of Session PT - End of Session Equipment Utilized During Treatment: Gait belt Activity Tolerance: Other (comment) (Limited by nausea) Patient left: in chair;with call bell/phone within reach;with family/visitor present Nurse Communication: Mobility status  GP     Camera Krienke 10/14/2011, 8:44 AM  [employer] PT [phone]

## 2011-10-14 NOTE — Care Management Note (Signed)
    Page 1 of 1   10/14/2011     9:25:10 AM   CARE MANAGEMENT NOTE 10/14/2011  Patient:  Chase Keller, Chase Keller   Account Number:  [id number]  Date Initiated:  10/14/2011  Documentation initiated by:  Junius Creamer  Subjective/Objective Assessment:   adm w total knee replacemnt     Action/Plan:   lives w wife, pcp dr Heloise Beecham   Anticipated DC Date:     Anticipated DC Plan:    In-house referral  Clinical Social Worker      DC Planning Services  CM consult      Choice offered to / List presented to:             Status of service:   Medicare Important Message given?   (If response is "NO", the following Medicare IM given date fields will be blank) Date Medicare IM given:   Date Additional Medicare IM given:    Discharge Disposition:    Per UR Regulation:  Reviewed for med. necessity/level of care/duration of stay  If discussed at Long Length of Stay Meetings, dates discussed:    Comments:  7/9 8:37a debbie Charidy Cappelletti rn,bsn [phone] spoke w pa. pt and wife want snf for rehab. pa states wife talking w [facility]. sw ref has been made.

## 2011-10-14 NOTE — Progress Notes (Signed)
Patient ID: Chase Keller, male   DOB: [birth year], 71 y.o.   MRN: [id number] PATIENT ID: Chase Keller  MRN: [id number]  DOB/AGE:  1940-11-17 / 71 y.o.  1 Day Post-Op Procedure(s) (LRB): TOTAL KNEE ARTHROPLASTY (Left)    PROGRESS NOTE Subjective: Patient is alert, oriented, yes Nausea, no Vomiting, yes passing gas, no Bowel Movement. Taking PO yes. Denies SOB, Chest or Calf Pain. Using Incentive Spirometer, PAS in place. Ambulate to chair, CPM 0-60 Patient reports pain as 5 on 0-10 scale  .    Objective: Vital signs in last 24 hours: Filed Vitals:   10/14/11 0500 10/14/11 0745 10/14/11 0830 10/14/11 0831  BP: 120/72 131/64    Pulse: 58 72 85 85  Temp:  98.9 F (37.2 C)    TempSrc:  Oral    Resp: 13 19    SpO2: 98% 97% 87% 97%      Intake/Output from previous day: I/O last 3 completed shifts: In: 3652 [I.V.:3650; IV Piggyback:2] Out: 2700 [Urine:2500; Drains:150; Blood:50]   Intake/Output this shift:     LABORATORY DATA:  Basename 10/14/11 0542  WBC 12.7*  HGB 13.0  HCT 40.4  PLT 183  NA 140  K 3.5  CL 99  CO2 30  BUN 20  CREATININE 1.13  GLUCOSE 115*  GLUCAP --  INR --  CALCIUM 8.7    Examination: ABD soft Neurovascular intact Sensation intact distally Intact pulses distally Dorsiflexion/Plantar flexion intact Incision: dressing C/D/I atrial fib with rate in the high 70's and 80's}  Assessment:   1 Day Post-Op Procedure(s) (LRB): TOTAL KNEE ARTHROPLASTY (Left) ADDITIONAL DIAGNOSIS:  Acute Blood Loss Anemia Patient Active Problem List  Diagnosis  . Hypothyroidism  . Hyperlipidemia  . Obstructive sleep apnea  . Cough  . Hypertension  . Obesity (BMI 30-39.9)  . Preoperative evaluation to rule out surgical contraindication  . Left knee DJD  . Coronary artery disease  . Stented coronary artery  . S/P coronary artery bypass graft x 2  . Bradycardia  . Atrial fibrillation by electrocardiogram  . Incomplete right bundle branch block  .  Leukocytosis    Plan: PT/OT WBAT, CPM 5/hrs day until ROM 0-60 degrees DVT Prophylaxis:  SCDx72hrs, lovenox bridge to coumadin DISCHARGE PLAN: Skilled Nursing Facility/Rehab DISCHARGE NEEDS: FL2 on chart Fluid bolus, dressing change, saline lock, d/c foley    Zivah Mayr J 10/14/2011, 9:19 AM

## 2011-10-14 NOTE — Progress Notes (Signed)
PT Treatment Note   10/14/11 1523  PT Visit Information  Last PT Received On 10/14/11  Assistance Needed +1  PT Time Calculation  PT Start Time 1500  PT Stop Time 1520  PT Time Calculation (min) 20 min  Subjective Data  Subjective Pt reports nausea is gone.  Precautions  Precautions Fall;Knee  Required Braces or Orthoses Knee Immobilizer - Left  Knee Immobilizer - Left On when out of bed or walking  Restrictions  Weight Bearing Restrictions Yes  LLE Weight Bearing WBAT  Cognition  Overall Cognitive Status Appears within functional limits for tasks assessed/performed  Arousal/Alertness Awake/alert  Orientation Level Appears intact for tasks assessed  Behavior During Session [facility] for tasks performed  Bed Mobility  Bed Mobility Sit to Supine  Supine to Sit 4: Min assist  Sitting - Scoot to Edge of Bed 5: Supervision  Sit to Supine 4: Min assist  Details for Bed Mobility Assistance assist for LLE only  Transfers  Sit to Stand 4: Min guard;With upper extremity assist;From bed  Stand to Sit 4: Min guard;With upper extremity assist;To bed  Details for Transfer Assistance Verbal cues for hand placement  Ambulation/Gait  Ambulation/Gait Assistance 4: Min assist  Ambulation Distance (Feet) 75 Feet  Assistive device Rolling walker  Gait Pattern Step-to pattern  Gait velocity slow cadence  Static Standing Balance  Static Standing - Balance Support Bilateral upper extremity supported  Static Standing - Level of Assistance 5: Stand by assistance  Exercises  Exercises Total Joint  Total Joint Exercises  Heel Slides AAROM;10 reps;Supine;Left  Straight Leg Raises AAROM;10 reps;Left;Supine  Goniometric ROM 5-50 degrees in supine  PT - End of Session  Equipment Utilized During Treatment Gait belt;Left knee immobilizer  Activity Tolerance Patient tolerated treatment well  Patient left in bed;with call bell/phone within reach;with family/visitor present  Nurse Communication Mobility  status  PT - Assessment/Plan  Comments on Treatment Session Pt adm for lt TKA. Pt with intraoperative bradycardia.  Good progress with mobility.  Wife disabled and unable to assist pt at home.  Pt plans for SNF.  PT Plan Discharge plan needs to be updated;Frequency remains appropriate  PT Frequency 7X/week  Follow Up Recommendations Skilled nursing facility  Equipment Recommended Defer to next venue  Acute Rehab PT Goals  PT Goal: Supine/Side to Sit - Progress Progressing toward goal  PT Goal: Sit to Supine/Side - Progress Progressing toward goal  PT Goal: Sit to Stand - Progress Progressing toward goal  PT Goal: Stand to Sit - Progress Progressing toward goal  PT Goal: Ambulate - Progress Progressing toward goal  PT General Charges  $$ ACUTE PT VISIT 1 Procedure  PT Treatments  $Gait Training 8-22 mins    [facility] PT [phone]

## 2011-10-14 NOTE — Progress Notes (Signed)
Report called to Selena Batten, RN on [place]. Pt fm is at the bedside and aware of the transfer.

## 2011-10-14 NOTE — Clinical Social Work Psychosocial (Signed)
     Clinical Social Work Department BRIEF PSYCHOSOCIAL ASSESSMENT 10/14/2011  Patient:  Chase Keller, Chase Keller     Account Number:  [id number]     Admit date:  10/13/2011  Clinical Social Worker:  Hulan Fray  Date/Time:  10/14/2011 09:35 AM  Referred by:  Physician  Date Referred:  10/13/2011 Referred for  SNF Placement   Other Referral:   Interview type:  Other - See comment Other interview type:   patient and wife, Chase Keller    PSYCHOSOCIAL DATA Living Status:  WIFE Admitted from facility:   Level of care:   Primary support name:  Chase Keller Primary support relationship to patient:  SPOUSE Degree of support available:   supportive    CURRENT CONCERNS Current Concerns  Post-Acute Placement   Other Concerns:    SOCIAL WORK ASSESSMENT / PLAN Clinical Social Worker received referral for short term SNF placement. CSW reviewed chart and saw PT's recommendation for home health, but patient and wife prefers SNF placement. Preferred SNF is [facility], and are willing to consider [facility]	[place], [facility] and [facility] as alternate choices, if [facility] did not have availability or a private room. CSW will initiate SNF search and complete FL2 for MD's signature.   Assessment/plan status:  Psychosocial Support/Ongoing Assessment of Needs Other assessment/ plan:   Information/referral to community resources:   SNF packet    PATIENTS/FAMILYS RESPONSE TO PLAN OF CARE: Patient and wife, Chase Keller are agreeable for short term SNF placement and appreciative of CSW's assistance.

## 2011-10-14 NOTE — Progress Notes (Signed)
   TELEMETRY: Reviewed telemetry pt in atrial fibrillation, rate controlled: Filed Vitals:   10/14/11 0500 10/14/11 0745 10/14/11 0830 10/14/11 0831  BP: 120/72 131/64    Pulse: 58 72 85 85  Temp:  98.9 F (37.2 C)    TempSrc:  Oral    Resp: 13 19    SpO2: 98% 97% 87% 97%    Intake/Output Summary (Last 24 hours) at 10/14/11 0944 Last data filed at 10/14/11 0500  Gross per 24 hour  Intake   3652 ml  Output   2700 ml  Net    952 ml    SUBJECTIVE No chest pain or SOB. No lightheadedness.  LABS: Basic Metabolic Panel:  Basename 10/14/11 0542  NA 140  K 3.5  CL 99  CO2 30  GLUCOSE 115*  BUN 20  CREATININE 1.13  CALCIUM 8.7  MG --  PHOS --   CBC:  Basename 10/14/11 0542  WBC 12.7*  NEUTROABS --  HGB 13.0  HCT 40.4  MCV 85.1  PLT 183   Cardiac Enzymes:  Basename 10/14/11 0815 10/13/11 2332 10/13/11 1557  CKTOTAL 345* 437* 441*  CKMB 7.5* 10.1* 8.4*  CKMBINDEX -- -- --  TROPONINI <0.30 <0.30 <0.30   Radiology/Studies:  No results found.  PHYSICAL EXAM General: Well developed, well nourished, in no acute distress. Head: Normocephalic, atraumatic, sclera non-icteric, no xanthomas, nares are without discharge. Neck: Negative for carotid bruits. JVD not elevated. Lungs: Clear bilaterally to auscultation without wheezes, rales, or rhonchi. Breathing is unlabored. Heart: IRRR S1 S2 without murmurs, rubs, or gallops.  Abdomen: Soft, non-tender, non-distended with normoactive bowel sounds. No hepatomegaly. No rebound/guarding. No obvious abdominal masses. Msk:  Strength and tone appears normal for age. Extremities: No clubbing, cyanosis or edema.  Left leg wrapped in ACE bandage Neuro: Alert and oriented X 3. Moves all extremities spontaneously. Psych:  Responds to questions appropriately with a normal affect.  ASSESSMENT AND PLAN: 1. Atrial fibrillation with slow ventricular response. Bradycardia resolved off labetolol. OK with surgery to start coumadin now.  Will continue DVT dose of Lovenox until INR therapeutic.  2.CAD s/p CABG and remote stents.  3. S/p left TKR.  4. Chronic systolic heart failure EF 45% well compensated.  5. HTN controlled. Will need to watch off labetolol. Could use ACEi if BP trends up.   Active Problems:  Hypothyroidism  Hyperlipidemia  Obstructive sleep apnea  Hypertension  Obesity (BMI 30-39.9)  Left knee DJD  Coronary artery disease  Stented coronary artery  S/P coronary artery bypass graft x 2  Bradycardia  Atrial fibrillation by electrocardiogram  Incomplete right bundle branch block  Leukocytosis    Signed, Peter [place] MD,FACC 10/14/2011 9:50 AM

## 2011-10-14 NOTE — Progress Notes (Signed)
ANTICOAGULATION CONSULT NOTE - Initial Consult  Pharmacy Consult:  Coumadin Indication:  Atrial fibrillation + VTE prophylaxis s/p TKA  Allergies  Allergen Reactions  . Ace Inhibitors Cough  . Nuvigil (Armodafinil)     headache  . Oxycodone Other (See Comments)    hallucinations  . Statins     Patient Measurements: Weight = 98.7 kg Height = 168  cm  Vital Signs: Temp: 98.9 F (37.2 C) (07/09 0745) Temp src: Oral (07/09 0745) BP: 131/64 mmHg (07/09 0745) Pulse Rate: 85  (07/09 0831)  Labs:  Alvira Philips 10/14/11 0815 10/14/11 0542 10/13/11 2332 10/13/11 1557  HGB -- 13.0 -- --  HCT -- 40.4 -- --  PLT -- 183 -- --  APTT -- -- -- --  LABPROT -- -- -- --  INR -- -- -- --  HEPARINUNFRC -- -- -- --  CREATININE -- 1.13 -- --  CKTOTAL 345* -- 437* 441*  CKMB 7.5* -- 10.1* 8.4*  TROPONINI <0.30 -- <0.30 <0.30    The CrCl is unknown because both a height and weight (above a minimum accepted value) are required for this calculation.   Medical History: Past Medical History  Diagnosis Date  . Hypertension   . Hyperlipidemia   . Left knee DJD   . Coronary artery disease   . Stented coronary artery   . S/P coronary artery bypass graft x 2   . PONV (postoperative nausea and vomiting)   . Sleep apnea     CPAP  . Heart murmur     / 1 MD said yes, 1 said no  . Chronic kidney disease     Kidney stone- passed  . Dysrhythmia     PA for Dr Thurston Hole said yes  . History of blood transfusion     during CABG  . MRSA (methicillin resistant staph aureus) culture positive     Drainage from Left Knee approx 2005  . Myocardial infarction     mild in 1998 per Wife   . Shortness of breath   . CHF (congestive heart failure)         Assessment: 70 YOM to continue Lovenox and add Coumadin for Afib and VTE prophylaxis s/p TKA.  Baseline INR 1.06, H/H/plts WNL.   Goal of Therapy:  INR 2-3 Monitor platelets by anticoagulation protocol: Yes     Plan:  - Coumadin 7.5mg  PO  today - Continue Lovenox 30mg  SQ Q12H until INR therapeutic - Daily PT / INR - Coumadin book / video - Watch BP, WBC      Britiny Defrain D. Laney Potash, PharmD, BCPS Pager:  [phone] 10/14/2011, 10:40 AM

## 2011-10-14 NOTE — Progress Notes (Signed)
Orthopedic Tech Progress Note Patient Details:  Chase Keller Jan 14, 1941 [id number]  Patient ID: [name], male   DOB: November 04, 1940, 71 y.o.   MRN: [id number]   Shawnie Pons 10/14/2011, 11:50 AM PT HAS KNEE IMMOBILIZER ON

## 2011-10-14 NOTE — Evaluation (Signed)
Occupational Therapy Evaluation Patient Details Name: Chase Keller MRN: [id number] DOB: 09-17-1940 Today's Date: 10/14/2011 Time: 3016-0109 OT Time Calculation (min): 31 min  OT Assessment / Plan / Recommendation Clinical Impression  71 yo male s/p Lt TKA with bradycardia that could benefit from skilled OT acutely. Recommend SNF at d/c    OT Assessment  Patient needs continued OT Services    Follow Up Recommendations  Skilled nursing facility    Barriers to Discharge      Equipment Recommendations  Defer to next venue    Recommendations for Other Services    Frequency  Min 2X/week    Precautions / Restrictions Precautions Precautions: Fall;Knee Required Braces or Orthoses: Knee Immobilizer - Left Knee Immobilizer - Left: On when out of bed or walking Restrictions LLE Weight Bearing: Weight bearing as tolerated   Pertinent Vitals/Pain None reported    ADL  Grooming: Performed;Wash/dry hands;Min guard Where Assessed - Grooming: Unsupported standing Lower Body Dressing: Performed;Moderate assistance Where Assessed - Lower Body Dressing: Unsupported sitting (don Rt sock and (A) to don Lt sock) Toilet Transfer: Performed;Minimal assistance Toilet Transfer Method: Sit to Barista: Comfort height toilet;Grab bars Toileting - Clothing Manipulation and Hygiene: Performed;Minimal assistance Where Assessed - Engineer, mining and Hygiene: Sit to stand from 3-in-1 or toilet Equipment Used: Gait belt;Rolling walker Transfers/Ambulation Related to ADLs: Pt ambulating ~15 ft with [employer] (A). Pt with min v/c to avoid pulling up on RW with sit<>Stand transfer ADL Comments: Pt correctly verbalized need for Ki for all mobility and total (A) to don KI. Pt total (A) to don Lt sock. Pt able to cross Rt Le onto EOB to don Rt sock. Pt has (A) from wife at d/c however prefer to transfer to SNF at d/c. Wife requesting [facility] for d/c planning    OT  Diagnosis: Generalized weakness;Acute pain  OT Problem List: Decreased strength;Decreased activity tolerance;Impaired balance (sitting and/or standing);Decreased safety awareness;Decreased knowledge of use of DME or AE;Decreased knowledge of precautions;Pain OT Treatment Interventions: Self-care/ADL training;DME and/or AE instruction;Therapeutic activities;Patient/family education;Balance training   OT Goals Acute Rehab OT Goals OT Goal Formulation: With patient/family Time For Goal Achievement: 10/28/11 Potential to Achieve Goals: Good ADL Goals Pt Will Perform Grooming: with modified independence;Standing at sink ADL Goal: Grooming - Progress: Goal set today Pt Will Perform Upper Body Bathing: with modified independence;Standing at sink ADL Goal: Upper Body Bathing - Progress: Goal set today Pt Will Perform Lower Body Bathing: with modified independence;Standing at sink;with adaptive equipment ADL Goal: Lower Body Bathing - Progress: Goal set today Pt Will Perform Upper Body Dressing: with modified independence;Sit to stand from bed ADL Goal: Upper Body Dressing - Progress: Goal set today Pt Will Perform Lower Body Dressing: with modified independence;Sit to stand from bed;Sit to stand from chair;with adaptive equipment ADL Goal: Lower Body Dressing - Progress: Goal set today Pt Will Transfer to Toilet: with modified independence;3-in-1 ADL Goal: Toilet Transfer - Progress: Goal set today  Visit Information  Last OT Received On: 10/14/11 Assistance Needed: +1    Subjective Data  Subjective: "my wife has both knees done" Patient Stated Goal: to get back home and doing things for myself   Prior Functioning  Home Living Lives With: Spouse Available Help at Discharge: Family;Available 24 hours/day Type of Home: House Home Access: Ramped entrance Home Layout: One level Bathroom Shower/Tub: Tub/shower unit;Walk-in shower Bathroom Toilet: Standard Home Adaptive Equipment: Straight  cane Prior Function Level of Independence: Independent Able to  Take Stairs?: Yes Driving: Yes Vocation: Part time employment 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  Overall Cognitive Status: Appears within functional limits for tasks assessed/performed Arousal/Alertness: Awake/alert Orientation Level: Appears intact for tasks assessed Behavior During Session: [facility] for tasks performed    Extremity/Trunk Assessment Right Upper Extremity Assessment RUE ROM/Strength/Tone: Within functional levels Left Upper Extremity Assessment LUE ROM/Strength/Tone: Within functional levels Trunk Assessment Trunk Assessment: Normal   Mobility Bed Mobility Bed Mobility: Supine to Sit;Sitting - Scoot to Edge of Bed Supine to Sit: 4: Min guard;HOB elevated;With rails Sitting - Scoot to Edge of Bed: 4: Min assist;With rail Details for Bed Mobility Assistance: Assist for LLE and to raise trunk. Transfers Sit to Stand: 4: Min assist;With upper extremity assist;From bed Stand to Sit: 4: Min assist;With upper extremity assist;To bed   Exercise    Balance Static Standing Balance Static Standing - Balance Support: Bilateral upper extremity supported Static Standing - Level of Assistance: 5: Stand by assistance  End of Session OT - End of Session Activity Tolerance: Patient tolerated treatment well Patient left: in bed;with call bell/phone within reach Nurse Communication: Mobility status       Lucile Shutters 10/14/2011, 2:58 PM Pager: [phone]

## 2011-10-15 LAB — BASIC METABOLIC PANEL
BUN: 22 mg/dL (ref 6–23)
CO2: 25 mEq/L (ref 19–32)
Calcium: 8.8 mg/dL (ref 8.4–10.5)
Chloride: 100 mEq/L (ref 96–112)
Creatinine, Ser: 1.12 mg/dL (ref 0.50–1.35)
GFR calc Af Amer: 75 mL/min — ABNORMAL LOW (ref >90)
GFR calc non Af Amer: 65 mL/min — ABNORMAL LOW (ref >90)
Glucose, Bld: 124 mg/dL — ABNORMAL HIGH (ref 70–99)
Potassium: 3.3 mEq/L — ABNORMAL LOW (ref 3.5–5.1)
Sodium: 139 mEq/L (ref 135–145)

## 2011-10-15 LAB — CBC
HCT: 36.8 % — ABNORMAL LOW (ref 39.0–52.0)
Hemoglobin: 11.9 g/dL — ABNORMAL LOW (ref 13.0–17.0)
MCH: 27.7 pg (ref 26.0–34.0)
MCHC: 32.3 g/dL (ref 30.0–36.0)
MCV: 85.6 fL (ref 78.0–100.0)
Platelets: 162 10*3/uL (ref 150–400)
RBC: 4.3 MIL/uL (ref 4.22–5.81)
RDW: 15.7 % — ABNORMAL HIGH (ref 11.5–15.5)
WBC: 14.1 10*3/uL — ABNORMAL HIGH (ref 4.0–10.5)

## 2011-10-15 LAB — PROTIME-INR
INR: 1.38 (ref 0.00–1.49)
Prothrombin Time: 17.2 seconds — ABNORMAL HIGH (ref 11.6–15.2)

## 2011-10-15 MED ORDER — POTASSIUM CHLORIDE CRYS ER 20 MEQ PO TBCR
20.0000 meq | EXTENDED_RELEASE_TABLET | Freq: Two times a day (BID) | ORAL | Status: DC
  Administered 2011-10-15 – 2011-10-16 (×3): 20 meq via ORAL
  Filled 2011-10-15 (×4): qty 1

## 2011-10-15 MED ORDER — SODIUM CHLORIDE 0.9 % IJ SOLN
3.0000 mL | Freq: Two times a day (BID) | INTRAMUSCULAR | Status: DC
  Administered 2011-10-15: 3 mL via INTRAVENOUS

## 2011-10-15 MED ORDER — WARFARIN SODIUM 7.5 MG PO TABS
7.5000 mg | ORAL_TABLET | Freq: Once | ORAL | Status: AC
  Administered 2011-10-15: 7.5 mg via ORAL
  Filled 2011-10-15: qty 1

## 2011-10-15 MED ORDER — BISACODYL 10 MG RE SUPP
10.0000 mg | Freq: Once | RECTAL | Status: AC
  Administered 2011-10-15: 10 mg via RECTAL
  Filled 2011-10-15: qty 1

## 2011-10-15 NOTE — Progress Notes (Signed)
Patient ID: [name], male   DOB: 1940-11-08, 71 y.o.   MRN: [id number]   TELEMETRY: Reviewed telemetry pt in atrial fibrillation,  Rates variable but higher than PACU  Filed Vitals:   10/14/11 2000 10/14/11 2153 10/15/11 0115 10/15/11 0615  BP:  144/72  135/72  Pulse:  82 83 88  Temp:  97.9 F (36.6 C)  98.1 F (36.7 C)  TempSrc:      Resp: 18 19 18 20   Height:      Weight:      SpO2: 93% 92% 98% 92%    Intake/Output Summary (Last 24 hours) at 10/15/11 1255 Last data filed at 10/15/11 0900  Gross per 24 hour  Intake    860 ml  Output    150 ml  Net    710 ml    SUBJECTIVE No chest pain or SOB. No lightheadedness.  LABS: Basic Metabolic Panel:  Basename 10/15/11 0630 10/14/11 0542  NA 139 140  K 3.3* 3.5  CL 100 99  CO2 25 30  GLUCOSE 124* 115*  BUN 22 20  CREATININE 1.12 1.13  CALCIUM 8.8 8.7  MG -- --  PHOS -- --   CBC:  Basename 10/15/11 0630 10/14/11 0542  WBC 14.1* 12.7*  NEUTROABS -- --  HGB 11.9* 13.0  HCT 36.8* 40.4  MCV 85.6 85.1  PLT 162 183   Cardiac Enzymes:  Basename 10/14/11 0815 10/13/11 2332 10/13/11 1557  CKTOTAL 345* 437* 441*  CKMB 7.5* 10.1* 8.4*  CKMBINDEX -- -- --  TROPONINI <0.30 <0.30 <0.30   Radiology/Studies:  No results found.  PHYSICAL EXAM General: Well developed, well nourished, in no acute distress. Head: Normocephalic, atraumatic, sclera non-icteric, no xanthomas, nares are without discharge. Neck: Negative for carotid bruits. JVD not elevated. Lungs: Clear bilaterally to auscultation without wheezes, rales, or rhonchi. Breathing is unlabored. Heart: IRRR S1 S2 without murmurs, rubs, or gallops.  Abdomen: Soft, non-tender, non-distended with normoactive bowel sounds. No hepatomegaly. No rebound/guarding. No obvious abdominal masses. Msk:  Strength and tone appears normal for age. Extremities: No clubbing, cyanosis or edema.  Left leg wrapped in ACE bandage Neuro: Alert and oriented X 3. Moves all  extremities spontaneously. Psych:  Responds to questions appropriately with a normal affect.  ASSESSMENT AND PLAN: 1. Atrial fibrillation with slow ventricular response. Bradycardia resolved off labetolol. OK with surgery to start coumadin now. Will continue DVT dose of Lovenox until INR therapeutic.  2.CAD s/p CABG and remote stents.  3. S/p left TKR.  4. Chronic systolic heart failure EF 45% well compensated.  5. HTN controlled. Will need to watch off labetolol. Could use ACEi if BP trends up.   Principal Problem:  *Left knee DJD Active Problems:  Hypothyroidism  Hyperlipidemia  Obstructive sleep apnea  Hypertension  Obesity (BMI 30-39.9)  Coronary artery disease  Stented coronary artery  S/P coronary artery bypass graft x 2  Bradycardia  Atrial fibrillation by electrocardiogram  Incomplete right bundle branch block  Leukocytosis    Vira Blanco MD,FACC 10/15/2011 12:55 PM

## 2011-10-15 NOTE — Progress Notes (Signed)
PT Cancellation Note  Treatment cancelled today due to patient's refusal to participate.  Pt refused second session due to recently returning to bed and now on CPM.  Will continue to follow.  Saharah Sherrow 10/15/2011, 3:22 PM Jake Shark, PT DPT [phone]

## 2011-10-15 NOTE — Progress Notes (Signed)
Patient ID: [name], male   DOB: 09/19/1940, 71 y.o.   MRN: [id number] PATIENT ID: AZARION HOVE  MRN: [id number]  DOB/AGE:  Mar 15, 1941 / 71 y.o.  2 Days Post-Op Procedure(s) (LRB): TOTAL KNEE ARTHROPLASTY (Left)    PROGRESS NOTE Subjective: Patient is alert, oriented, no Nausea, no Vomiting, yes passing gas, no Bowel Movement. Taking PO yes. Denies SOB, Chest or Calf Pain. Using Incentive Spirometer, PAS in place. Ambulate well, CPM 0-70 Patient reports pain as 4 on 0-10 scale  .    Objective: Vital signs in last 24 hours: Filed Vitals:   10/14/11 2000 10/14/11 2153 10/15/11 0115 10/15/11 0615  BP:  144/72  135/72  Pulse:  82 83 88  Temp:  97.9 F (36.6 C)  98.1 F (36.7 C)  TempSrc:      Resp: 18 19 18 20   Height:      Weight:      SpO2: 93% 92% 98% 92%      Intake/Output from previous day: I/O last 3 completed shifts: In: 2222 [P.O.:240; I.V.:1980; IV Piggyback:2] Out: 950 [Urine:800; Drains:150]   Intake/Output this shift:     LABORATORY DATA:  Basename 10/15/11 0630 10/14/11 0542  WBC 14.1* 12.7*  HGB 11.9* 13.0  HCT 36.8* 40.4  PLT 162 183  NA 139 140  K 3.3* 3.5  CL 100 99  CO2 25 30  BUN 22 20  CREATININE 1.12 1.13  GLUCOSE 124* 115*  GLUCAP -- --  INR 1.38 --  CALCIUM 8.8 --    Examination: Neurologically intact Neurovascular intact Sensation intact distally Intact pulses distally Dorsiflexion/Plantar flexion intact Incision: dressing C/D/I and scant drainage}  Assessment:   2 Days Post-Op Procedure(s) (LRB): TOTAL KNEE ARTHROPLASTY (Left) ADDITIONAL DIAGNOSIS:   Patient Active Problem List  Diagnosis  . Hypothyroidism  . Hyperlipidemia  . Obstructive sleep apnea  . Cough  . Hypertension  . Obesity (BMI 30-39.9)  . Preoperative evaluation to rule out surgical contraindication  . Left knee DJD  . Coronary artery disease  . Stented coronary artery  . S/P coronary artery bypass graft x 2  . Bradycardia  . Atrial  fibrillation by electrocardiogram  . Incomplete right bundle branch block  . Leukocytosis   Plan: PT/OT WBAT, CPM 5/hrs day until ROM 0-90 degrees, then D/C CPM DVT Prophylaxis:  SCDx72hrs, ASA 325 mg BID x 2 weeks DISCHARGE PLAN: Skilled Nursing Facility/Rehab DISCHARGE NEEDS: needs FL2 on chart     Esteen Delpriore J 10/15/2011, 8:04 AM

## 2011-10-15 NOTE — Progress Notes (Signed)
Bed offer in place and accepted by patient at [facility]. They will accept when medically stable per MD.  Will assist with d/c when stable.  Lorri Frederick. Chevy Sweigert, LCSWA  [phone]

## 2011-10-15 NOTE — Progress Notes (Signed)
ANTICOAGULATION CONSULT NOTE - Follow Up Consult  Pharmacy Consult for Warfarin Indication: atrial fibrillation and VTE prophylaxis  Allergies  Allergen Reactions  . Ace Inhibitors Cough  . Nuvigil (Armodafinil)     headache  . Oxycodone Other (See Comments)    hallucinations  . Statins     Patient Measurements: Height: 5\' 6"  (167.6 cm) Weight: 217 lb 11.2 oz (98.748 kg) IBW/kg (Calculated) : 63.8   Vital Signs: Temp: 98.1 F (36.7 C) (07/10 0615) BP: 135/72 mmHg (07/10 0615) Pulse Rate: 88  (07/10 0615)  Labs:  Basename 10/15/11 0630 10/14/11 0815 10/14/11 0542 10/13/11 2332 10/13/11 1557  HGB 11.9* -- 13.0 -- --  HCT 36.8* -- 40.4 -- --  PLT 162 -- 183 -- --  APTT -- -- -- -- --  LABPROT 17.2* -- -- -- --  INR 1.38 -- -- -- --  HEPARINUNFRC -- -- -- -- --  CREATININE 1.12 -- 1.13 -- --  CKTOTAL -- 345* -- 437* 441*  CKMB -- 7.5* -- 10.1* 8.4*  TROPONINI -- <0.30 -- <0.30 <0.30    Estimated Creatinine Clearance: 67.5 ml/min (by C-G formula based on Cr of 1.12).   Medications:  Scheduled:    . bisacodyl  10 mg Rectal Once  . cholecalciferol  1,000 Units Oral Daily  . coumadin book   Does not apply Once  . docusate sodium  100 mg Oral BID  . enoxaparin (LOVENOX) injection  30 mg Subcutaneous Q12H  . irbesartan  150 mg Oral Daily  . potassium chloride  20 mEq Oral BID  . sodium chloride  500 mL Intravenous Once  . sorbitol  30 mL Oral BID  . vitamin C  1,000 mg Oral Daily  . warfarin  7.5 mg Oral ONCE-1800  . warfarin   Does not apply Once  . Warfarin - Pharmacist Dosing Inpatient   Does not apply q1800    Assessment: 71 yo male s/p tka with history of afib currently of day 2 of coumadin; INR 1.38  Goal of Therapy:  INR 2-3 Monitor platelets by anticoagulation protocol: Yes   Plan:  Continue coumadin at 7.5mg  daily Daily [name]  Abran Duke, PharmD Clinical Pharmacist Phone: [phone] Pager: [phone] 10/15/2011 8:50 AM

## 2011-10-16 LAB — BASIC METABOLIC PANEL
BUN: 18 mg/dL (ref 6–23)
CO2: 26 mEq/L (ref 19–32)
Calcium: 9 mg/dL (ref 8.4–10.5)
Chloride: 102 mEq/L (ref 96–112)
Creatinine, Ser: 0.91 mg/dL (ref 0.50–1.35)
GFR calc Af Amer: >90 mL/min (ref >90)
GFR calc non Af Amer: 84 mL/min — ABNORMAL LOW (ref >90)
Glucose, Bld: 114 mg/dL — ABNORMAL HIGH (ref 70–99)
Potassium: 4.1 mEq/L (ref 3.5–5.1)
Sodium: 140 mEq/L (ref 135–145)

## 2011-10-16 LAB — CBC
HCT: 36.6 % — ABNORMAL LOW (ref 39.0–52.0)
Hemoglobin: 11.8 g/dL — ABNORMAL LOW (ref 13.0–17.0)
MCH: 27.7 pg (ref 26.0–34.0)
MCHC: 32.2 g/dL (ref 30.0–36.0)
MCV: 85.9 fL (ref 78.0–100.0)
Platelets: 157 10*3/uL (ref 150–400)
RBC: 4.26 MIL/uL (ref 4.22–5.81)
RDW: 15.7 % — ABNORMAL HIGH (ref 11.5–15.5)
WBC: 12.9 10*3/uL — ABNORMAL HIGH (ref 4.0–10.5)

## 2011-10-16 LAB — PROTIME-INR
INR: 2 — ABNORMAL HIGH (ref 0.00–1.49)
Prothrombin Time: 23 seconds — ABNORMAL HIGH (ref 11.6–15.2)

## 2011-10-16 MED ORDER — WARFARIN SODIUM 5 MG PO TABS: 5.0000 mg | ORAL_TABLET | Freq: Every day | ORAL | Status: DC

## 2011-10-16 MED ORDER — WARFARIN - PHARMACIST DOSING INPATIENT: 1.0000 | Freq: Every day | Status: DC

## 2011-10-16 MED ORDER — HYDROCODONE-ACETAMINOPHEN 10-325 MG PO TABS: 1.0000 | ORAL_TABLET | ORAL | Status: AC | PRN

## 2011-10-16 MED ORDER — DSS 100 MG PO CAPS: 100.0000 mg | ORAL_CAPSULE | Freq: Two times a day (BID) | ORAL | Status: AC

## 2011-10-16 MED ORDER — ENOXAPARIN SODIUM 30 MG/0.3ML ~~LOC~~ SOLN: 30.0000 mg | Freq: Two times a day (BID) | SUBCUTANEOUS | Status: DC

## 2011-10-16 NOTE — Progress Notes (Signed)
Physical Therapy Treatment Patient Details Name: Chase Keller MRN: [id number] DOB: [birth year] Today's Date: 10/16/2011 Time: 1022-1050 PT Time Calculation (min): 28 min  PT Assessment / Plan / Recommendation Comments on Treatment Session  Pt continues to make progress and able to increase ambulation distance.  Pt to d/c to SNF today and after ambulating pt and wife wanted pt to begin getting ready for d/c.  Further mobility and HEP will be addressed at SNF.    Follow Up Recommendations  Skilled nursing facility    Barriers to Discharge        Equipment Recommendations  Defer to next venue    Recommendations for Other Services    Frequency 7X/week   Plan Discharge plan remains appropriate;Frequency remains appropriate    Precautions / Restrictions Precautions Precautions: Fall;Knee Required Braces or Orthoses: Knee Immobilizer - Left Knee Immobilizer - Left: On when out of bed or walking Restrictions Weight Bearing Restrictions: Yes LLE Weight Bearing: Weight bearing as tolerated   Pertinent Vitals/Pain 3-4/10 knee pain    Mobility  Bed Mobility Bed Mobility: Supine to Sit Supine to Sit: 4: Min assist;HOB flat;With rails Details for Bed Mobility Assistance: (A) with LLE OOB with cues for technique Transfers Transfers: Sit to Stand;Stand to Sit Sit to Stand: 4: Min guard;With upper extremity assist;From bed Stand to Sit: 4: Min guard;With upper extremity assist;To bed Details for Transfer Assistance: Minguard for safety with cues for proper hand placement Ambulation/Gait Ambulation/Gait Assistance: 4: Min assist Ambulation Distance (Feet): 125 Feet Assistive device: Rolling walker Ambulation/Gait Assistance Details: (A) to manage RW and cues for proper step sequence; encouraged pt to perform step through gait  Gait Pattern: Step-to pattern;Step-through pattern;Decreased stride length Gait velocity: slow cadence    Exercises     PT Diagnosis:    PT Problem List:    PT Treatment Interventions:     PT Goals Acute Rehab PT Goals PT Goal Formulation: With patient Time For Goal Achievement: 10/21/11 Potential to Achieve Goals: Good Pt will go Supine/Side to Sit: with modified independence PT Goal: Supine/Side to Sit - Progress: Progressing toward goal Pt will go Sit to Supine/Side: with modified independence PT Goal: Sit to Supine/Side - Progress: Progressing toward goal Pt will go Sit to Stand: with modified independence PT Goal: Sit to Stand - Progress: Progressing toward goal Pt will go Stand to Sit: with modified independence PT Goal: Stand to Sit - Progress: Progressing toward goal Pt will Ambulate: 51 - 150 feet;with supervision;with modified independence PT Goal: Ambulate - Progress: Progressing toward goal  Visit Information  Last PT Received On: 10/16/11 Assistance Needed: +1    Subjective Data      Cognition  Overall Cognitive Status: Appears within functional limits for tasks assessed/performed Arousal/Alertness: Awake/alert Orientation Level: Appears intact for tasks assessed Behavior During Session: [facility] for tasks performed    Balance     End of Session PT - End of Session Equipment Utilized During Treatment: Gait belt;Left knee immobilizer Activity Tolerance: Patient tolerated treatment well Patient left: with call bell/phone within reach;Other (comment) Nurse Communication: Mobility status   GP     Mckaila Duffus 10/16/2011, 11:00 AM Jake Shark, PT DPT [phone]

## 2011-10-16 NOTE — Progress Notes (Signed)
Rept called to Scientist, research (medical) at [facility]

## 2011-10-16 NOTE — Discharge Summary (Signed)
Chase Keller ID: Chase Keller MRN: [id number] DOB/AGE: 1941-02-21 71 y.o.  Admit date: 10/13/2011 Discharge date: 10/16/2011  Admission Diagnoses:  Principal Problem:  *Left knee DJD Active Problems:  Hypothyroidism  Hyperlipidemia  Obstructive sleep apnea  Hypertension  Obesity (BMI 30-39.9)  Coronary artery disease  Stented coronary artery  S/P coronary artery bypass graft x 2  Bradycardia  Atrial fibrillation by electrocardiogram  Incomplete right bundle branch block  Leukocytosis   Discharge Diagnoses:  Same  Past Medical History  Diagnosis Date  . Hypertension   . Hyperlipidemia   . Left knee DJD   . Coronary artery disease   . Stented coronary artery   . S/P coronary artery bypass graft x 2   . PONV (postoperative nausea and vomiting)   . Sleep apnea     CPAP  . Heart murmur     / 1 MD said yes, 1 said no  . Chronic kidney disease     Kidney stone- passed  . Dysrhythmia     PA for Dr Thurston Hole said yes  . History of blood transfusion     during CABG  . MRSA (methicillin resistant staph aureus) culture positive     Drainage from Left Knee approx 2005  . Myocardial infarction     mild in 1998 per Wife   . Shortness of breath   . CHF (congestive heart failure)     Surgeries: Procedure(s): TOTAL KNEE ARTHROPLASTY on 10/13/2011   Consultants: Treatment Team:  Rounding Lbcardiology, MD  Discharged Condition: Improved  Hospital Course: Chase Keller is an 71 y.o. male who was admitted 10/13/2011 for operative treatment ofLeft knee DJD. Chase Keller has severe unremitting pain that affects sleep, daily activities, and work/hobbies. After pre-op clearance the Chase Keller was taken to the operating room on 10/13/2011 and underwent  Procedure(s): TOTAL KNEE ARTHROPLASTY.    Chase Keller was given perioperative antibiotics: Anti-infectives     Start     Dose/Rate Route Frequency Ordered Stop   10/13/11 2200   vancomycin (VANCOCIN) IVPB 1000 mg/200 mL premix        1,000  mg 200 mL/hr over 60 Minutes Intravenous Every 12 hours 10/13/11 1927 10/13/11 2249   10/13/11 1148   cefUROXime (ZINACEF) injection  Status:  Discontinued          As needed 10/13/11 1148 10/13/11 1339   10/12/11 1430   ceFAZolin (ANCEF) IVPB 2 g/50 mL premix        2 g 100 mL/hr over 30 Minutes Intravenous 60 min pre-op 10/12/11 1430 10/13/11 1114          Intraop, this Chase Keller had significant bradycardia with his afib.  His rate was 29-32.  Cardiology was consulted.  They recommend to stop his lopressor.  Stop his plavix.  Use a Lovenox bridge until his coumadin was between 2.0 and 3.0.  He was in Cardiac ICU for the 1st night post op due to his bradycardia.  His rate improved and now is in the 80's.  He was transferred to the orthopedic floor on Post op day 1 and has done well without complication since surgery.  He will need follow up with [name] 2 weeks post op  Chase Keller was given sequential compression devices, early ambulation, and chemoprophylaxis to prevent DVT.  Chase Keller benefited maximally from hospital stay and there were no other complications than the intra op bradycardia.    Recent vital signs: Chase Keller Vitals for the past 24 hrs:  BP Temp Pulse Resp SpO2  10/16/11 0615 152/82 mmHg 99.4 F (37.4 C) 74  20  96 %  10/16/11 0400 - - - - 95 %  10/16/11 0000 - - - 18  97 %  Oct 30, 2011 2111 132/61 mmHg 98.9 F (37.2 C) 90  18  90 %  2011/10/30 2000 - - - 18  -  10/30/11 1600 - - - 20  91 %  10-30-2011 1300 154/76 mmHg 97.7 F (36.5 C) 80  18  90 %  October 30, 2011 0800 - - - 18  93 %     Recent laboratory studies:  Basename 10/16/11 0600 2011-10-30 0630 10/14/11 0542  WBC 12.9* 14.1* --  HGB 11.8* 11.9* --  HCT 36.6* 36.8* --  PLT 157 162 --  NA -- 139 140  K -- 3.3* 3.5  CL -- 100 99  CO2 -- 25 30  BUN -- 22 20  CREATININE -- 1.12 1.13  GLUCOSE -- 124* 115*  INR 2.00* 1.38 --  CALCIUM -- 8.8 --     Discharge Medications:   Medication List  As of 10/16/2011  7:06 AM    STOP taking these medications         Alfalfa 650 MG Tabs      clopidogrel 75 MG tablet      Fish Oil 1200 MG Caps      GLUCOSAMINE HCL-MSM PO      labetalol 100 MG tablet      Melatonin 3 MG Caps      Red Yeast Rice 600 MG Caps      VITAMIN B6 PO      vitamin E 400 UNIT capsule         TAKE these medications         albuterol 108 (90 BASE) MCG/ACT inhaler   Commonly known as: PROVENTIL HFA;VENTOLIN HFA   Inhale 2 puffs into the lungs every 6 (six) hours as needed. For shortness of breath or wheezing      DSS 100 MG Caps   Take 100 mg by mouth 2 (two) times daily.      enoxaparin 30 MG/0.3ML injection   Commonly known as: LOVENOX   Inject 0.3 mLs (30 mg total) into the skin every 12 (twelve) hours.      HYALURONIC ACID PO   Take 100 mg by mouth 2 (two) times daily.      HYDROcodone-acetaminophen 10-325 MG per tablet   Commonly known as: NORCO   Take 1-2 tablets by mouth every 4 (four) hours as needed (breakthrough pain).      Potassium 99 MG Tabs   Take 99 mg by mouth 2 (two) times daily.      valsartan-hydrochlorothiazide 160-25 MG per tablet   Commonly known as: DIOVAN-HCT   Take 1 tablet by mouth daily.      vitamin C 1000 MG tablet   Take 1,000 mg by mouth daily.      Vitamin D3 5000 UNITS Chew   Chew 5,000 Int'l Units by mouth daily.      Warfarin - Pharmacist Dosing Inpatient Misc   1 each by Does not apply route daily at 6 PM.      warfarin 5 MG tablet   Commonly known as: COUMADIN   Take 1 tablet (5 mg total) by mouth daily.            Diagnostic Studies: No results found.  Disposition: Stable  Discharge Orders    Future Orders Please Complete By Expires   Diet -  low sodium heart healthy      Call MD / Call 911      Comments:   If you experience chest pain or shortness of breath, CALL 911 and be transported to the hospital emergency room.  If you develope a fever above 101 F, pus (white drainage) or increased drainage or redness  at the wound, or calf pain, call your surgeon's office.   Constipation Prevention      Comments:   Drink plenty of fluids.  Prune juice may be helpful.  You may use a stool softener, such as Colace (over the counter) 100 mg twice a day.  Use MiraLax (over the counter) for constipation as needed.   Increase activity slowly as tolerated      Discharge instructions      Comments:   Total Knee Replacement Care After Refer to this sheet in the next few weeks. These discharge instructions provide you with general information on caring for yourself after you leave the hospital. Your caregiver may also give you specific instructions. Your treatment has been planned according to the most current medical practices available, but unavoidable complications sometimes occur. If you have any problems or questions after discharge, please call your caregiver. Regaining a near full range of motion of your knee within the first 3 to 6 weeks after surgery is critical. HOME CARE INSTRUCTIONS  You may resume a normal diet and activities as directed.  Perform exercises as directed.  Place yellow foam block, yellow side up under heel at all times except when in CPM or when walking.  DO NOT modify, tear, cut, or change in any way. You will receive physical therapy daily  Take showers instead of baths until informed otherwise.  Change bandages (dressings)daily Do not take over-the-counter or prescription medicines for pain, discomfort, or fever. Eat a well-balanced diet.  Avoid lifting or driving until you are instructed otherwise.  Make an appointment to see your caregiver for stitches (suture) or staple removal as directed.  If you have been sent home with a continuous passive motion machine (CPM machine), 0-90 degrees 6 hrs a day   2 hrs a shift SEEK MEDICAL CARE IF: You have swelling of your calf or leg.  You develop shortness of breath or chest pain.  You have redness, swelling, or increasing pain in the wound.   There is pus or any unusual drainage coming from the surgical site.  You notice a bad smell coming from the surgical site or dressing.  The surgical site breaks open after sutures or staples have been removed.  There is persistent bleeding from the suture or staple line.  You are getting worse or are not improving.  You have any other questions or concerns.  SEEK IMMEDIATE MEDICAL CARE IF:  You have a fever.  You develop a rash.  You have difficulty breathing.  You develop any reaction or side effects to medicines given.  Your knee motion is decreasing rather than improving.  MAKE SURE YOU:  Understand these instructions.  Will watch your condition.  Will get help right away if you are not doing well or get worse.   CPM      Comments:   Continuous passive motion machine (CPM):      Use the CPM from 0 to 90 for 6 hours per day.       You may break it up into 2 or 3 sessions per day.      Use CPM for 2 weeks  or until you are told to stop.   TED hose      Comments:   Use stockings (TED hose) for 2 weeks on both leg(s).  You may remove them at night for sleeping.   Change dressing      Comments:   Change the dressing daily with sterile 4 x 4 inch gauze dressing and apply TED hose.  You may clean the incision with alcohol prior to redressing.   Do not put a pillow under the knee. Place it under the heel.      Scheduling Instructions:   Place yellow foam block, yellow side up under heel at all times except when in CPM or when walking.  DO NOT modify, tear, cut, or change in any way the yellow foam block.   Comments:   Place yellow foam block, yellow side up under heel at all times except when in CPM or when walking.  DO NOT modify, tear, cut, or change in any way the yellow foam block.      Follow-up Information    Follow up with Nilda Simmer, MD on 10/27/2011. (appt time 2:45pm)    Contact information:   [facility] [address], [address] [place] [address] [phone]           Signed: Pascal Lux 10/16/2011, 7:06 AM

## 2011-10-16 NOTE — Progress Notes (Signed)
Pt assisted to BR via tech this AM. Pt noted to have a blanchable inner buttock rash. Pt states, "that is where I sweated". Area washed and opsite applied. Pt encouraged to turn self every two hours to prevent further skin irritation. Pt verb understanding and agrees to comply.

## 2011-10-17 DIAGNOSIS — I2510 Atherosclerotic heart disease of native coronary artery without angina pectoris: Secondary | ICD-10-CM

## 2011-10-17 DIAGNOSIS — M1710 Unilateral primary osteoarthritis, unspecified knee: Secondary | ICD-10-CM

## 2011-10-17 DIAGNOSIS — I4891 Unspecified atrial fibrillation: Secondary | ICD-10-CM

## 2011-10-17 DIAGNOSIS — IMO0002 Reserved for concepts with insufficient information to code with codable children: Secondary | ICD-10-CM

## 2011-10-17 DIAGNOSIS — I10 Essential (primary) hypertension: Secondary | ICD-10-CM

## 2011-10-20 ENCOUNTER — Telehealth: Payer: Self-pay | Admitting: Internal Medicine

## 2011-10-20 DIAGNOSIS — L02419 Cutaneous abscess of limb, unspecified: Secondary | ICD-10-CM

## 2011-10-20 DIAGNOSIS — L03119 Cellulitis of unspecified part of limb: Secondary | ICD-10-CM

## 2011-10-20 NOTE — Telephone Encounter (Signed)
error 

## 2011-10-23 ENCOUNTER — Telehealth: Payer: Self-pay | Admitting: Internal Medicine

## 2011-10-23 NOTE — Telephone Encounter (Signed)
error 

## 2011-10-24 DIAGNOSIS — Z5181 Encounter for therapeutic drug level monitoring: Secondary | ICD-10-CM

## 2011-10-29 DIAGNOSIS — Z5181 Encounter for therapeutic drug level monitoring: Secondary | ICD-10-CM

## 2011-10-31 DIAGNOSIS — Z5181 Encounter for therapeutic drug level monitoring: Secondary | ICD-10-CM

## 2011-11-03 ENCOUNTER — Telehealth: Payer: Self-pay | Admitting: Internal Medicine

## 2011-11-03 ENCOUNTER — Other Ambulatory Visit: Payer: Self-pay | Admitting: Internal Medicine

## 2011-11-03 LAB — PROTIME-INR

## 2011-11-03 NOTE — Telephone Encounter (Signed)
Patient Name: Chase Keller Patient Gender: Male PCP Fax : [phone] Patient DOB: 03-Jul-1940 Practice Name: [facility] Reason for Call: Caller: Sharon/RN; PCP: Tillman Keller; CB#: [phone]; 11/02/11 Jasmine December, RN from [facility] calling about patient BP - @ 8:20 pm : BP 168/105 (right arm) and 184/103 (left arm) - was 136/65 @ 3 pm . Currently on Lebeatalol 100 mg BID and Diovan/HCT 160-25 - 1 pill qd. BP taken @ 9 pm while on phone with triager - 158/90. All Emergent S/S of Hypertension, Diagnosed or Suspected r/o except "single elevated blood pressure reading and has questions". Awilda Metro, RN to let the rounding physician know of the high reading tonight and to call back if he has any subsequent reading where the systolic is over 413 or the diastolic is over 244. She will put a note in the communication system for Dr Orlie Dakin and will call the office in the AM if there are any other questions before he rounds next time. Protocol(s) Used: Hypertension, Diagnosed or Suspected Recommended Outcome per Protocol: Provide Home/Self Care Reason for Outcome: Single elevated blood pressure reading and has questions Care Advice: ~

## 2011-11-04 ENCOUNTER — Telehealth: Payer: Self-pay

## 2011-11-04 ENCOUNTER — Telehealth: Payer: Self-pay | Admitting: Internal Medicine

## 2011-11-04 DIAGNOSIS — Z7901 Long term (current) use of anticoagulants: Secondary | ICD-10-CM

## 2011-11-04 NOTE — Telephone Encounter (Signed)
Discussed today with Deana RN Will hold coumadin for 2 days Recheck 8/1 at Dr [facility] office (he is leaving rehab today)

## 2011-11-04 NOTE — Telephone Encounter (Signed)
Chase Keller @ [facility] called about Chase Keller [name] saw him yesterday.   Chase gave [name] resutls of pt/inr that was done yesterday. Pt stated that [name] followed him for this.  [name] would not follow him for pt/inr that dr [name] would follow this [name] wanted pt to have pt/inr done on Thursday 8/1 Hold comduian until redone. Appointment 11/06/11 @ 9:30

## 2011-11-04 NOTE — Telephone Encounter (Signed)
Triage Record Num: [id number] Operator: Tarri Glenn Patient Name: Chase Keller Call Date & Time: 11/03/2011 9:15:38PM Patient Phone: [phone] PCP: Tillman Abide Patient Gender: Male PCP Fax : [phone] Patient DOB: [birth year] Practice Name: [facility] Reason for Call: Caller: Melva/RN; PCP: Tillman Abide; CB#: [phone]; Call regarding [name] results; Melva, RN, from [facility] is calling about [name] results from 11/03/11. PT 41.8 INR 4.21. No unusual bleeding/bruising. Advised no change in current Coumadin dosage and call office 11/04/11 for further orders per standing orders. Protocol(s) Used: Office Note Recommended Outcome per Protocol: Information Noted and Sent to Office Reason for Outcome: Caller information to office Care Advice: ~

## 2011-11-06 ENCOUNTER — Other Ambulatory Visit: Payer: Medicare Other

## 2011-11-06 ENCOUNTER — Other Ambulatory Visit (INDEPENDENT_AMBULATORY_CARE_PROVIDER_SITE_OTHER): Payer: Medicare Other | Admitting: *Deleted

## 2011-11-06 DIAGNOSIS — Z7901 Long term (current) use of anticoagulants: Secondary | ICD-10-CM

## 2011-11-06 LAB — PROTIME-INR
INR: 2.5 ratio — ABNORMAL HIGH (ref 0.8–1.0)
Prothrombin Time: 28 s — ABNORMAL HIGH (ref 10.2–12.4)

## 2011-11-13 ENCOUNTER — Other Ambulatory Visit (INDEPENDENT_AMBULATORY_CARE_PROVIDER_SITE_OTHER): Payer: Medicare Other | Admitting: *Deleted

## 2011-11-13 DIAGNOSIS — Z7901 Long term (current) use of anticoagulants: Secondary | ICD-10-CM

## 2011-11-13 LAB — PROTIME-INR
INR: 3.2 ratio — ABNORMAL HIGH (ref 0.8–1.0)
Prothrombin Time: 35.3 s — ABNORMAL HIGH (ref 10.2–12.4)

## 2011-11-13 MED ORDER — WARFARIN SODIUM 4 MG PO TABS: 4.0000 mg | ORAL_TABLET | Freq: Every day | ORAL | Status: DC

## 2011-11-13 NOTE — Addendum Note (Signed)
Addended by: Duncan Dull on: 11/13/2011 02:45 PM   Modules accepted: Orders

## 2011-11-21 ENCOUNTER — Ambulatory Visit (INDEPENDENT_AMBULATORY_CARE_PROVIDER_SITE_OTHER): Payer: Medicare Other | Admitting: Internal Medicine

## 2011-11-21 ENCOUNTER — Encounter: Payer: Self-pay | Admitting: Internal Medicine

## 2011-11-21 VITALS — BP 120/72 | HR 96 | Temp 97.9°F | Wt 211.5 lb

## 2011-11-21 DIAGNOSIS — IMO0002 Reserved for concepts with insufficient information to code with codable children: Secondary | ICD-10-CM

## 2011-11-21 DIAGNOSIS — R001 Bradycardia, unspecified: Secondary | ICD-10-CM

## 2011-11-21 DIAGNOSIS — I498 Other specified cardiac arrhythmias: Secondary | ICD-10-CM

## 2011-11-21 DIAGNOSIS — I4891 Unspecified atrial fibrillation: Secondary | ICD-10-CM

## 2011-11-21 DIAGNOSIS — I10 Essential (primary) hypertension: Secondary | ICD-10-CM

## 2011-11-21 DIAGNOSIS — D649 Anemia, unspecified: Secondary | ICD-10-CM

## 2011-11-21 DIAGNOSIS — R5383 Other fatigue: Secondary | ICD-10-CM

## 2011-11-21 DIAGNOSIS — M1712 Unilateral primary osteoarthritis, left knee: Secondary | ICD-10-CM

## 2011-11-21 DIAGNOSIS — R5381 Other malaise: Secondary | ICD-10-CM

## 2011-11-21 DIAGNOSIS — M1710 Unilateral primary osteoarthritis, unspecified knee: Secondary | ICD-10-CM

## 2011-11-21 DIAGNOSIS — E039 Hypothyroidism, unspecified: Secondary | ICD-10-CM

## 2011-11-21 NOTE — Assessment & Plan Note (Addendum)
Diagnosed during preop EKG.  Surgery postponed for 2 weeks.  Now on coumadin. He is requesting to resume all his multivitamins that he was taking prior to her surgery. He will resume them and return in one week for followup Coumadin check. I will be managing his Coumadin. He will discuss the relative versus per Pradaxa with his cardiologist at next visit.

## 2011-11-21 NOTE — Patient Instructions (Addendum)
Resume all of your previous supplements and your red yeast rice.   We will recheck your [name] next week.

## 2011-11-21 NOTE — Progress Notes (Signed)
Patient ID: Chase Keller, male   DOB: Aug 28, 1940, 71 y.o.   MRN: [id number]  Patient Active Problem List  Diagnosis  . Hypothyroidism  . Hyperlipidemia  . Obstructive sleep apnea  . Hypertension  . Obesity (BMI 30-39.9)  . Left knee DJD  . Coronary artery disease  . Stented coronary artery  . S/P coronary artery bypass graft x 2  . Bradycardia  . Atrial fibrillation by electrocardiogram  . Incomplete right bundle branch block  . Leukocytosis  . Atrial fibrillation    Subjective:  CC:   No chief complaint on file.   HPI:   Chase Arreola Pattersonis a 70 y.o. male who presents Followup after knee replacement. He had a total left knee replacement byb Dr. Fredric Mare about 5 weeks ago. Patient feels lousy but according to his wife he was told he has 2-3 weeks ahead of schedule for physical therapy. He is walking without a cane. He thinks his lethargy and fatigue are due to the discontinuation of all his vital vitamin supplements that he was taking prior to surgery because of his post procedure Coumadin therapy and is requesting to resume them.    Past Medical History  Diagnosis Date  . Hypertension   . Hyperlipidemia   . Left knee DJD   . Coronary artery disease   . Stented coronary artery   . S/P coronary artery bypass graft x 2   . PONV (postoperative nausea and vomiting)   . Sleep apnea     CPAP  . Heart murmur     / 1 MD said yes, 1 said no  . Chronic kidney disease     Kidney stone- passed  . Dysrhythmia     PA for Dr Thurston Hole said yes  . History of blood transfusion     during CABG  . MRSA (methicillin resistant staph aureus) culture positive     Drainage from Left Knee approx 2005  . Myocardial infarction     mild in 1998 per Wife   . Shortness of breath   . CHF (congestive heart failure)     Past Surgical History  Procedure Date  . Ptca 2005    [name]  . Bone spurs     Back  . Coronary artery bypass graft 1988    2 vessel,  followed by [facility]  . Cardiac  stents 2005  . 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 Total knee arthroplasty 10/13/2011    Procedure: TOTAL KNEE ARTHROPLASTY;  Surgeon: Nilda Simmer, MD;  Location: [facility] OR;  Service: Orthopedics;  Laterality: Left;  left total knee arthroplasty         The following portions of the patient's history were reviewed and updated as appropriate: Allergies, current medications, and problem list.    Review of Systems:   A comprehensive ROS was done and positive for fatigue..   The rest was negative.   History   Social History  . Marital Status: Married    Spouse Name: N/A    Number of Children: 0  . Years of Education: N/A   Occupational History  . Electrician - workes PT     has had previous exposure to metals and also to agent orange   Social History Main Topics  . Smoking status: Never Smoker   . Smokeless tobacco: Never Used   Comment: father smoked in the home while pt was growing up  . Alcohol Use: No  . Drug  Use: No  . Sexually Active: Not on file   Other Topics Concern  . Not on file   Social History Narrative  . No narrative on file    Objective:  BP 120/72  Pulse 96  Temp 97.9 F (36.6 C) (Oral)  Wt 211 lb 8 oz (95.936 kg)  SpO2 98%  General appearance: alert, cooperative and appears stated age Ears: normal TM's and external ear canals both ears Throat: lips, mucosa, and tongue normal; teeth and gums normal Neck: no adenopathy, no carotid bruit, supple, symmetrical, trachea midline and thyroid not enlarged, symmetric, no tenderness/mass/nodules Back: symmetric, no curvature. ROM normal. No CVA tenderness. Lungs: clear to auscultation bilaterally Heart: regular rate and rhythm, S1, S2 normal, no murmur, click, rub or gallop Abdomen: soft, non-tender; bowel sounds normal; no masses,  no organomegaly Pulses: 2+ and symmetric Skin: Skin color, texture, turgor normal. No rashes or lesions Lymph nodes: Cervical, supraclavicular, and  axillary nodes normal.  Assessment and Plan:  Atrial fibrillation by electrocardiogram Diagnosed during preop EKG.  Surgery postponed for 2 weeks.  Now on coumadin. He is requesting to resume all his multivitamins that he was taking prior to her surgery. He will resume them and return in one week for followup Coumadin check. I will be managing his Coumadin. He will discuss the relative versus per Pradaxa with his cardiologist at next visit.  Left knee DJD Status post left knee replacement by Dr. Wyline Mood.  He is requesting to resume his R. supplements as this has helped with osteoarthritis of other joints in the past.  Bradycardia His beta blocker was stopped during his postoperative hospitalization due to bradycardia. His current rate, resting is 96. His fatigue may been compounded by exertional tachycardia. I've asked him to check his pulse during his walks and it his pulse is above 110 consistently we will resume low-dose metoprolol and stop his labetalol.  Hypertension Well-controlled on current Regimen. Given his episode of bradycardia during hospitalization not many changes today, but in the future he may need to resume his beta blocker for resting heart rate higher than tolerated with exercise   Updated Medication List Outpatient Encounter Prescriptions as of 11/21/2011  Medication Sig Dispense Refill  . albuterol (PROVENTIL HFA;VENTOLIN HFA) 108 (90 BASE) MCG/ACT inhaler Inhale 2 puffs into the lungs every 6 (six) hours as needed. For shortness of breath or wheezing      . Ascorbic Acid (VITAMIN C) 1000 MG tablet Take 1,000 mg by mouth daily.      . Cholecalciferol (VITAMIN D3) 5000 UNITS CHEW Chew 5,000 Int'l Units by mouth daily.       Marland Kitchen docusate sodium (COLACE) 100 MG capsule Take 100 mg by mouth 2 (two) times daily.      Marland Kitchen Hyaluronic Acid-Vitamin C (HYALURONIC ACID PO) Take 100 mg by mouth 2 (two) times daily.      Marland Kitchen HYDROcodone-acetaminophen (NORCO) 10-325 MG per tablet Take 1  tablet by mouth every 4 (four) hours as needed.      . labetalol (NORMODYNE) 100 MG tablet Take 100 mg by mouth 2 (two) times daily.      . Potassium 99 MG TABS Take 99 mg by mouth 2 (two) times daily.       . traMADol (ULTRAM) 50 MG tablet Take 50 mg by mouth every 8 (eight) hours as needed.      . valsartan-hydrochlorothiazide (DIOVAN-HCT) 160-25 MG per tablet Take 1 tablet by mouth daily.      Marland Kitchen warfarin (  COUMADIN) 4 MG tablet Take 4 mg by mouth daily.      Marland Kitchen DISCONTD: warfarin (COUMADIN) 2.5 MG tablet Take 2.5 mg by mouth. Monday and Friday      . DISCONTD: warfarin (COUMADIN) 5 MG tablet Take 5 mg by mouth.      . DISCONTD: enoxaparin (LOVENOX) 30 MG/0.3ML injection Inject 0.3 mLs (30 mg total) into the skin every 12 (twelve) hours.  10 Syringe  0  . DISCONTD: warfarin (COUMADIN) 4 MG tablet Take 1 tablet (4 mg total) by mouth daily.  30 tablet  3  . DISCONTD: Warfarin - Pharmacist Dosing Inpatient MISC 1 each by Does not apply route daily at 6 PM.  1 each  0     Orders Placed This Encounter  Procedures  . CBC with Differential  . Ferritin  . Iron and TIBC  . Protime-INR  . Comprehensive metabolic panel  . TSH    No Follow-up on file.

## 2011-11-23 ENCOUNTER — Encounter: Payer: Self-pay | Admitting: Internal Medicine

## 2011-11-23 DIAGNOSIS — I4891 Unspecified atrial fibrillation: Secondary | ICD-10-CM | POA: Insufficient documentation

## 2011-11-23 NOTE — Assessment & Plan Note (Signed)
Well-controlled on current Regimen. Given his episode of bradycardia during hospitalization not many changes today, but in the future he may need to resume his beta blocker for resting heart rate higher than tolerated with exercise

## 2011-11-23 NOTE — Assessment & Plan Note (Signed)
His beta blocker was stopped during his postoperative hospitalization due to bradycardia. His current rate, resting is 96. His fatigue may been compounded by exertional tachycardia. I've asked him to check his pulse during his walks and it his pulse is above 110 consistently we will resume low-dose metoprolol and stop his labetalol.

## 2011-11-23 NOTE — Assessment & Plan Note (Signed)
Status post left knee replacement by Dr. Wyline Mood.  He is requesting to resume his R. supplements as this has helped with osteoarthritis of other joints in the past.

## 2011-11-24 ENCOUNTER — Other Ambulatory Visit: Payer: Self-pay | Admitting: Internal Medicine

## 2011-11-24 MED ORDER — WARFARIN SODIUM 4 MG PO TABS: 4.0000 mg | ORAL_TABLET | Freq: Every day | ORAL | Status: DC

## 2011-11-24 MED ORDER — VALSARTAN-HYDROCHLOROTHIAZIDE 160-25 MG PO TABS: 1.0000 | ORAL_TABLET | Freq: Every day | ORAL | Status: DC

## 2011-11-24 MED ORDER — TRAMADOL HCL 50 MG PO TABS: 50.0000 mg | ORAL_TABLET | Freq: Three times a day (TID) | ORAL | Status: DC | PRN

## 2011-11-24 MED ORDER — LABETALOL HCL 100 MG PO TABS: 100.0000 mg | ORAL_TABLET | Freq: Two times a day (BID) | ORAL | Status: DC

## 2011-11-26 ENCOUNTER — Other Ambulatory Visit (INDEPENDENT_AMBULATORY_CARE_PROVIDER_SITE_OTHER): Payer: Medicare Other | Admitting: *Deleted

## 2011-11-26 DIAGNOSIS — E876 Hypokalemia: Secondary | ICD-10-CM

## 2011-11-26 DIAGNOSIS — R5383 Other fatigue: Secondary | ICD-10-CM

## 2011-11-26 DIAGNOSIS — D649 Anemia, unspecified: Secondary | ICD-10-CM

## 2011-11-26 DIAGNOSIS — Z7901 Long term (current) use of anticoagulants: Secondary | ICD-10-CM

## 2011-11-26 DIAGNOSIS — R5381 Other malaise: Secondary | ICD-10-CM

## 2011-11-26 DIAGNOSIS — I4891 Unspecified atrial fibrillation: Secondary | ICD-10-CM

## 2011-11-26 LAB — CBC WITH DIFFERENTIAL/PLATELET
Basophils Absolute: 0.1 10*3/uL (ref 0.0–0.1)
Basophils Relative: 0.7 % (ref 0.0–3.0)
Eosinophils Absolute: 0.5 10*3/uL (ref 0.0–0.7)
Eosinophils Relative: 5.5 % — ABNORMAL HIGH (ref 0.0–5.0)
HCT: 37.5 % — ABNORMAL LOW (ref 39.0–52.0)
Hemoglobin: 12 g/dL — ABNORMAL LOW (ref 13.0–17.0)
Lymphocytes Relative: 23.4 % (ref 12.0–46.0)
Lymphs Abs: 2.1 10*3/uL (ref 0.7–4.0)
MCHC: 32 g/dL (ref 30.0–36.0)
MCV: 85.2 fl (ref 78.0–100.0)
Monocytes Absolute: 0.9 10*3/uL (ref 0.1–1.0)
Monocytes Relative: 10.4 % (ref 3.0–12.0)
Neutro Abs: 5.3 10*3/uL (ref 1.4–7.7)
Neutrophils Relative %: 60 % (ref 43.0–77.0)
Platelets: 243 10*3/uL (ref 150.0–400.0)
RBC: 4.41 Mil/uL (ref 4.22–5.81)
RDW: 15.3 % — ABNORMAL HIGH (ref 11.5–14.6)
WBC: 8.9 10*3/uL (ref 4.5–10.5)

## 2011-11-26 LAB — TSH: TSH: 2.89 u[IU]/mL (ref 0.35–5.50)

## 2011-11-26 LAB — COMPREHENSIVE METABOLIC PANEL
ALT: 16 U/L (ref 0–53)
AST: 18 U/L (ref 0–37)
Albumin: 4.2 g/dL (ref 3.5–5.2)
Alkaline Phosphatase: 72 U/L (ref 39–117)
BUN: 15 mg/dL (ref 6–23)
CO2: 30 mEq/L (ref 19–32)
Calcium: 9.5 mg/dL (ref 8.4–10.5)
Chloride: 99 mEq/L (ref 96–112)
Creatinine, Ser: 1.1 mg/dL (ref 0.4–1.5)
GFR: 73.25 mL/min (ref >60.00)
Glucose, Bld: 94 mg/dL (ref 70–99)
Potassium: 3.5 mEq/L (ref 3.5–5.1)
Sodium: 139 mEq/L (ref 135–145)
Total Bilirubin: 0.8 mg/dL (ref 0.3–1.2)
Total Protein: 7.3 g/dL (ref 6.0–8.3)

## 2011-11-26 LAB — BASIC METABOLIC PANEL
BUN: 15 mg/dL (ref 6–23)
CO2: 30 mEq/L (ref 19–32)
Calcium: 9.5 mg/dL (ref 8.4–10.5)
Chloride: 99 mEq/L (ref 96–112)
Creatinine, Ser: 1.1 mg/dL (ref 0.4–1.5)
GFR: 73.25 mL/min (ref >60.00)
Glucose, Bld: 94 mg/dL (ref 70–99)
Potassium: 3.5 mEq/L (ref 3.5–5.1)
Sodium: 139 mEq/L (ref 135–145)

## 2011-11-26 LAB — PROTIME-INR
INR: 3.5 ratio — ABNORMAL HIGH (ref 0.8–1.0)
Prothrombin Time: 38.9 s — ABNORMAL HIGH (ref 10.2–12.4)

## 2011-11-26 LAB — MAGNESIUM: Magnesium: 1.8 mg/dL (ref 1.5–2.5)

## 2011-11-26 LAB — FERRITIN: Ferritin: 161.1 ng/mL (ref 22.0–322.0)

## 2011-11-27 LAB — IRON AND TIBC
%SAT: 12 % — ABNORMAL LOW (ref 20–55)
Iron: 37 ug/dL — ABNORMAL LOW (ref 42–165)
TIBC: 304 ug/dL (ref 215–435)
UIBC: 267 ug/dL (ref 125–400)

## 2011-11-28 MED ORDER — WARFARIN SODIUM 3 MG PO TABS: 3.0000 mg | ORAL_TABLET | Freq: Every day | ORAL | Status: DC

## 2011-11-28 MED ORDER — FERROUS FUM-IRON POLYSACCH 162-115.2 MG PO CAPS: 1.0000 | ORAL_CAPSULE | Freq: Every day | ORAL | Status: DC

## 2011-11-28 NOTE — Addendum Note (Signed)
Addended by: Duncan Dull on: 11/28/2011 12:50 AM   Modules accepted: Orders, Medications

## 2011-12-01 ENCOUNTER — Telehealth: Payer: Self-pay | Admitting: Internal Medicine

## 2011-12-01 ENCOUNTER — Other Ambulatory Visit: Payer: Self-pay | Admitting: Internal Medicine

## 2011-12-01 DIAGNOSIS — Z7901 Long term (current) use of anticoagulants: Secondary | ICD-10-CM

## 2011-12-01 MED ORDER — WARFARIN SODIUM 1 MG PO TABS: 3.0000 mg | ORAL_TABLET | ORAL | Status: DC

## 2011-12-01 NOTE — Telephone Encounter (Signed)
Wednesday added.   please add another one for Monday . Dr Dan Humphreys will need to follow up on it .

## 2011-12-01 NOTE — Telephone Encounter (Signed)
Pt went to see dr Wyline Mood in [place].  Ms Wolbert has an infection in left knee.  They put him on avelox and the pharmacy told them he needed to get his protime checked every 3 days  Made appointment for wednesday

## 2011-12-02 NOTE — Telephone Encounter (Signed)
Will schedule lab appt when he comes in on wed.

## 2011-12-03 ENCOUNTER — Ambulatory Visit (INDEPENDENT_AMBULATORY_CARE_PROVIDER_SITE_OTHER): Payer: Medicare Other | Admitting: *Deleted

## 2011-12-03 DIAGNOSIS — Z7901 Long term (current) use of anticoagulants: Secondary | ICD-10-CM

## 2011-12-03 LAB — PROTIME-INR
INR: 3.2 ratio — ABNORMAL HIGH (ref 0.8–1.0)
Prothrombin Time: 36 s — ABNORMAL HIGH (ref 10.2–12.4)

## 2011-12-05 ENCOUNTER — Ambulatory Visit (INDEPENDENT_AMBULATORY_CARE_PROVIDER_SITE_OTHER): Payer: Medicare Other | Admitting: *Deleted

## 2011-12-05 DIAGNOSIS — Z7901 Long term (current) use of anticoagulants: Secondary | ICD-10-CM

## 2011-12-05 LAB — PROTIME-INR
INR: 3 ratio — ABNORMAL HIGH (ref 0.8–1.0)
Prothrombin Time: 33.3 s — ABNORMAL HIGH (ref 10.2–12.4)

## 2011-12-05 NOTE — Addendum Note (Signed)
Addended by: Jobie Quaker on: 12/05/2011 09:47 AM   Modules accepted: Orders

## 2011-12-09 ENCOUNTER — Ambulatory Visit: Payer: Medicare Other

## 2011-12-09 ENCOUNTER — Other Ambulatory Visit (INDEPENDENT_AMBULATORY_CARE_PROVIDER_SITE_OTHER): Payer: Medicare Other | Admitting: *Deleted

## 2011-12-09 DIAGNOSIS — Z7901 Long term (current) use of anticoagulants: Secondary | ICD-10-CM

## 2011-12-09 LAB — PROTIME-INR
INR: 1.94 — ABNORMAL HIGH (ref <1.50)
Prothrombin Time: 22.8 seconds — ABNORMAL HIGH (ref 11.6–15.2)

## 2011-12-10 ENCOUNTER — Telehealth: Payer: Self-pay | Admitting: Internal Medicine

## 2011-12-10 NOTE — Telephone Encounter (Signed)
Patient stopped by to let us know that Dr. Thurston Hole put him on Celebrex 200mg 

## 2011-12-11 ENCOUNTER — Ambulatory Visit: Payer: Medicare Other

## 2011-12-12 ENCOUNTER — Ambulatory Visit: Payer: Medicare Other

## 2011-12-12 ENCOUNTER — Other Ambulatory Visit (INDEPENDENT_AMBULATORY_CARE_PROVIDER_SITE_OTHER): Payer: Medicare Other | Admitting: *Deleted

## 2011-12-12 DIAGNOSIS — Z7901 Long term (current) use of anticoagulants: Secondary | ICD-10-CM

## 2011-12-12 LAB — PROTIME-INR
INR: 2.3 ratio — ABNORMAL HIGH (ref 0.8–1.0)
Prothrombin Time: 25.4 s — ABNORMAL HIGH (ref 10.2–12.4)

## 2011-12-12 NOTE — Telephone Encounter (Signed)
Patient said that he is aware and is asking how often he will need to have his coumadin checked. Please advise.

## 2011-12-12 NOTE — Telephone Encounter (Signed)
Please find out if patient and Dr Wyline Mood discussed the risks of using celebrex with coumadin . It increases his risk of a GI bleed.

## 2011-12-15 ENCOUNTER — Telehealth: Payer: Self-pay | Admitting: Internal Medicine

## 2011-12-17 ENCOUNTER — Encounter: Payer: Self-pay | Admitting: Internal Medicine

## 2011-12-17 ENCOUNTER — Ambulatory Visit (INDEPENDENT_AMBULATORY_CARE_PROVIDER_SITE_OTHER): Payer: Medicare Other | Admitting: Internal Medicine

## 2011-12-17 VITALS — BP 144/80 | HR 74 | Temp 97.7°F | Resp 16 | Wt 217.5 lb

## 2011-12-17 DIAGNOSIS — E785 Hyperlipidemia, unspecified: Secondary | ICD-10-CM

## 2011-12-17 DIAGNOSIS — I498 Other specified cardiac arrhythmias: Secondary | ICD-10-CM

## 2011-12-17 DIAGNOSIS — I4891 Unspecified atrial fibrillation: Secondary | ICD-10-CM

## 2011-12-17 DIAGNOSIS — R001 Bradycardia, unspecified: Secondary | ICD-10-CM

## 2011-12-17 DIAGNOSIS — D649 Anemia, unspecified: Secondary | ICD-10-CM

## 2011-12-17 DIAGNOSIS — Z7901 Long term (current) use of anticoagulants: Secondary | ICD-10-CM

## 2011-12-17 DIAGNOSIS — I10 Essential (primary) hypertension: Secondary | ICD-10-CM

## 2011-12-17 LAB — PROTIME-INR
INR: 2.6 ratio — ABNORMAL HIGH (ref 0.8–1.0)
Prothrombin Time: 29.1 s — ABNORMAL HIGH (ref 10.2–12.4)

## 2011-12-17 NOTE — Assessment & Plan Note (Signed)
Slightly elevated on labetalol and diovan.  Metoprolol stopped due to bradycardia.  Will check over the next 2 weeks.

## 2011-12-17 NOTE — Patient Instructions (Signed)
Check your bp 5 times over the next 2 weeks. And send to me for evaluation.  We may end up increasing the labetalol or changing you to something else

## 2011-12-17 NOTE — Assessment & Plan Note (Signed)
Currently rate controlled. 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 [name] is due today due to recent addition of Celebrex for management of knee inflammation.

## 2011-12-17 NOTE — Assessment & Plan Note (Addendum)
He has a history of Statin intolerance due to severe myalgias.  He has been Taking red yeast rice 600 mg bid.  LDL was 124 in Dec. goal is less than 100. He will Return for fasting labs in one month.

## 2011-12-17 NOTE — Progress Notes (Signed)
Patient ID: Chase Keller, male   DOB: [birth year], 71 y.o.   MRN: [id number]  Patient Active Problem List  Diagnosis  . Hypothyroidism  . Hyperlipidemia  . Obstructive sleep apnea  . Hypertension  . Obesity (BMI 30-39.9)  . Left knee DJD  . Coronary artery disease  . Stented coronary artery  . S/P coronary artery bypass graft x 2  . Bradycardia  . Atrial fibrillation by electrocardiogram  . Incomplete right bundle branch block  . Leukocytosis    Subjective:  CC:   Chief Complaint  Patient presents with  . Follow-up    2 week    HPI:   Chase Keller a 71 y.o. male who presents for 4 week followup on chronic conditions including new onset atrial fibrillation, hypertension, and recent knee replacement with superficial infection treated by orthopedist with fluoroquinolone. He continues to have swelling in the knee and was started on Celebrex by Dr. Dianah Field 2 weeks ago. He needs a repeat Coumadin level today. His blood pressures at home have been slightly elevated ranging from 140-155 systolic with diastolics in the 70-80 range.   Past Medical History  Diagnosis Date  . Hypertension   . Hyperlipidemia   . Left knee DJD   . Coronary artery disease   . Stented coronary artery   . S/P coronary artery bypass graft x 2   . PONV (postoperative nausea and vomiting)   . Sleep apnea     CPAP  . Heart murmur     / 1 MD said yes, 1 said no  . Chronic kidney disease     Kidney stone- passed  . Dysrhythmia     PA for Dr Thurston Hole said yes  . History of blood transfusion     during CABG  . MRSA (methicillin resistant staph aureus) culture positive     Drainage from Left Knee approx 2005  . Myocardial infarction     mild in 1998 per Wife   . Shortness of breath   . CHF (congestive heart failure)     Past Surgical History  Procedure Date  . Ptca 2005    [name]  . Bone spurs     Back  . Coronary artery bypass graft 1988    2 vessel,  followed by [facility]  . Cardiac  stents 2005  . 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 Total knee arthroplasty 10/13/2011    Procedure: TOTAL KNEE ARTHROPLASTY;  Surgeon: Nilda Simmer, MD;  Location: [facility] OR;  Service: Orthopedics;  Laterality: Left;  left total knee arthroplasty         The following portions of the patient's history were reviewed and updated as appropriate: Allergies, current medications, and problem list.    Review of Systems:   12 Pt  review of systems was negative except those addressed in the HPI,     History   Social History  . Marital Status: Married    Spouse Name: N/A    Number of Children: 0  . Years of Education: N/A   Occupational History  . Electrician - workes PT     has had previous exposure to metals and also to agent orange   Social History Main Topics  . Smoking status: Never Smoker   . Smokeless tobacco: Never Used   Comment: father smoked in the home while pt was growing up  . Alcohol Use: No  . Drug Use: No  . Sexually Active:  Not on file   Other Topics Concern  . Not on file   Social History Narrative  . No narrative on file    Objective:  BP 144/80  Pulse 74  Temp 97.7 F (36.5 C) (Oral)  Resp 16  Wt 217 lb 8 oz (98.657 kg)  SpO2 97%  General appearance: alert, cooperative and appears stated age Ears: normal TM's and external ear canals both ears Throat: lips, mucosa, and tongue normal; teeth and gums normal Neck: no adenopathy, no carotid bruit, supple, symmetrical, trachea midline and thyroid not enlarged, symmetric, no tenderness/mass/nodules Back: symmetric, no curvature. ROM normal. No CVA tenderness. Lungs: clear to auscultation bilaterally Heart: irreg irreg S1, S2 normal, no murmur, click, rub or gallop Abdomen: soft, non-tender; bowel sounds normal; no masses,  no organomegaly Pulses: 2+ and symmetric Skin: Skin color, texture, turgor normal. No rashes or lesions MSK: left knee swollen,  Surgical incision well healed,    Lymph nodes: Cervical, supraclavicular, and axillary nodes normal.  Assessment and Plan:  Hypertension Slightly elevated on labetalol and diovan.  Metoprolol stopped due to bradycardia.  Will check over the next 2 weeks.    Hyperlipidemia He has a history of Statin intolerance due to severe myalgias.  He has been Taking red yeast rice 600 mg bid.  LDL was 124 in Dec. goal is less than 100. He will Return for fasting labs in one month.    Bradycardia Resolved with cessation of the Toprol. He continues to take labetalol for management of hypertension.  Atrial fibrillation by electrocardiogram Currently rate controlled. 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 [name] is due today due to recent addition of Celebrex for management of knee inflammation.   Updated Medication List Outpatient Encounter Prescriptions as of 12/17/2011  Medication Sig Dispense Refill  . albuterol (PROVENTIL HFA;VENTOLIN HFA) 108 (90 BASE) MCG/ACT inhaler Inhale 2 puffs into the lungs every 6 (six) hours as needed. For shortness of breath or wheezing      . Ascorbic Acid (VITAMIN C) 1000 MG tablet Take 1,000 mg by mouth daily.      . CELEBREX 200 MG capsule Take 200 mg by mouth Daily.      . Cholecalciferol (VITAMIN D3) 5000 UNITS CHEW Chew 5,000 Int'l Units by mouth daily.       . ferrous fumarate-iron polysaccharide complex (TANDEM) 162-115.2 MG CAPS Take 1 capsule by mouth daily with breakfast.  30 capsule  3  . Hyaluronic Acid-Vitamin C (HYALURONIC ACID PO) Take 100 mg by mouth 2 (two) times daily.      Marland Kitchen HYDROcodone-acetaminophen (NORCO) 10-325 MG per tablet Take 1 tablet by mouth every 4 (four) hours as needed.      . labetalol (NORMODYNE) 100 MG tablet Take 1 tablet (100 mg total) by mouth 2 (two) times daily.  180 tablet  3  . Potassium 99 MG TABS Take 99 mg by mouth 2 (two) times daily.       . traMADol (ULTRAM) 50 MG tablet Take 1 tablet (50 mg total) by mouth every 8 (eight) hours as needed.  90 tablet  3   . valsartan-hydrochlorothiazide (DIOVAN-HCT) 160-25 MG per tablet Take 1 tablet by mouth daily.  90 tablet  3  . warfarin (COUMADIN) 1 MG tablet Take 3 tablets (3 mg total) by mouth as directed.  90 tablet  3  . DISCONTD: docusate sodium (COLACE) 100 MG capsule Take 100 mg by mouth 2 (two) times daily.  Orders Placed This Encounter  Procedures  . Protime-INR  . Lipid panel  . Comprehensive metabolic panel  . Protime-INR  . CBC with Differential    No Follow-up on file.

## 2011-12-17 NOTE — Assessment & Plan Note (Signed)
Resolved with cessation of the Toprol. He continues to take labetalol for management of hypertension.

## 2012-01-14 ENCOUNTER — Telehealth: Payer: Self-pay | Admitting: Internal Medicine

## 2012-01-14 ENCOUNTER — Other Ambulatory Visit (INDEPENDENT_AMBULATORY_CARE_PROVIDER_SITE_OTHER): Payer: Medicare Other

## 2012-01-14 DIAGNOSIS — E785 Hyperlipidemia, unspecified: Secondary | ICD-10-CM

## 2012-01-14 DIAGNOSIS — D649 Anemia, unspecified: Secondary | ICD-10-CM

## 2012-01-14 DIAGNOSIS — Z7901 Long term (current) use of anticoagulants: Secondary | ICD-10-CM

## 2012-01-14 LAB — LIPID PANEL
Cholesterol: 158 mg/dL (ref 0–200)
HDL: 24 mg/dL — ABNORMAL LOW (ref >39.00)
LDL Cholesterol: 110 mg/dL — ABNORMAL HIGH (ref 0–99)
Total CHOL/HDL Ratio: 7
Triglycerides: 122 mg/dL (ref 0.0–149.0)
VLDL: 24.4 mg/dL (ref 0.0–40.0)

## 2012-01-14 LAB — CBC WITH DIFFERENTIAL/PLATELET
Basophils Absolute: 0.1 10*3/uL (ref 0.0–0.1)
Basophils Relative: 0.6 % (ref 0.0–3.0)
Eosinophils Absolute: 0.4 10*3/uL (ref 0.0–0.7)
Eosinophils Relative: 4.1 % (ref 0.0–5.0)
HCT: 41.3 % (ref 39.0–52.0)
Hemoglobin: 13.2 g/dL (ref 13.0–17.0)
Lymphocytes Relative: 18.9 % (ref 12.0–46.0)
Lymphs Abs: 1.6 10*3/uL (ref 0.7–4.0)
MCHC: 32 g/dL (ref 30.0–36.0)
MCV: 85.7 fl (ref 78.0–100.0)
Monocytes Absolute: 0.9 10*3/uL (ref 0.1–1.0)
Monocytes Relative: 10.7 % (ref 3.0–12.0)
Neutro Abs: 5.7 10*3/uL (ref 1.4–7.7)
Neutrophils Relative %: 65.7 % (ref 43.0–77.0)
Platelets: 165 10*3/uL (ref 150.0–400.0)
RBC: 4.82 Mil/uL (ref 4.22–5.81)
RDW: 16.4 % — ABNORMAL HIGH (ref 11.5–14.6)
WBC: 8.6 10*3/uL (ref 4.5–10.5)

## 2012-01-14 LAB — COMPREHENSIVE METABOLIC PANEL
ALT: 17 U/L (ref 0–53)
AST: 21 U/L (ref 0–37)
Albumin: 4 g/dL (ref 3.5–5.2)
Alkaline Phosphatase: 54 U/L (ref 39–117)
BUN: 20 mg/dL (ref 6–23)
CO2: 25 mEq/L (ref 19–32)
Calcium: 8.9 mg/dL (ref 8.4–10.5)
Chloride: 107 mEq/L (ref 96–112)
Creatinine, Ser: 1 mg/dL (ref 0.4–1.5)
GFR: 80.16 mL/min (ref >60.00)
Glucose, Bld: 114 mg/dL — ABNORMAL HIGH (ref 70–99)
Potassium: 3.7 mEq/L (ref 3.5–5.1)
Sodium: 141 mEq/L (ref 135–145)
Total Bilirubin: 1.2 mg/dL (ref 0.3–1.2)
Total Protein: 7 g/dL (ref 6.0–8.3)

## 2012-01-14 LAB — PROTIME-INR
INR: 2.3 ratio — ABNORMAL HIGH (ref 0.8–1.0)
Prothrombin Time: 24 s — ABNORMAL HIGH (ref 10.2–12.4)

## 2012-01-14 MED ORDER — VALSARTAN-HYDROCHLOROTHIAZIDE 160-25 MG PO TABS: 1.0000 | ORAL_TABLET | Freq: Every day | ORAL | Status: DC

## 2012-01-14 NOTE — Telephone Encounter (Signed)
Pt needs refill on valsartan [facility] [address] [phone]  Pt is completely out of meds

## 2012-01-15 ENCOUNTER — Other Ambulatory Visit: Payer: Self-pay

## 2012-01-15 ENCOUNTER — Emergency Department: Payer: Self-pay | Admitting: Emergency Medicine

## 2012-01-15 ENCOUNTER — Other Ambulatory Visit: Payer: Self-pay | Admitting: Internal Medicine

## 2012-01-15 LAB — BASIC METABOLIC PANEL
Anion Gap: 9 (ref 7–16)
BUN: 17 mg/dL (ref 7–18)
Calcium, Total: 9.2 mg/dL (ref 8.5–10.1)
Chloride: 104 mmol/L (ref 98–107)
Co2: 28 mmol/L (ref 21–32)
Creatinine: 0.99 mg/dL (ref 0.60–1.30)
EGFR (African American): >60
EGFR (Non-African Amer.): >60
Glucose: 102 mg/dL — ABNORMAL HIGH (ref 65–99)
Osmolality: 283 (ref 275–301)
Potassium: 3.6 mmol/L (ref 3.5–5.1)
Sodium: 141 mmol/L (ref 136–145)

## 2012-01-15 LAB — CBC
HCT: 43.9 % (ref 40.0–52.0)
HGB: 14 g/dL (ref 13.0–18.0)
MCH: 27.1 pg (ref 26.0–34.0)
MCHC: 32 g/dL (ref 32.0–36.0)
MCV: 85 fL (ref 80–100)
Platelet: 183 10*3/uL (ref 150–440)
RBC: 5.18 10*6/uL (ref 4.40–5.90)
RDW: 16.3 % — ABNORMAL HIGH (ref 11.5–14.5)
WBC: 11.7 10*3/uL — ABNORMAL HIGH (ref 3.8–10.6)

## 2012-01-15 LAB — CK TOTAL AND CKMB (NOT AT ARMC)
CK, Total: 244 U/L — ABNORMAL HIGH (ref 35–232)
CK-MB: 3.1 ng/mL (ref 0.5–3.6)

## 2012-01-15 LAB — TROPONIN I: Troponin-I: <0.02 ng/mL

## 2012-01-15 MED ORDER — AMLODIPINE BESYLATE 2.5 MG PO TABS: 2.5000 mg | ORAL_TABLET | ORAL | Status: DC | PRN

## 2012-01-15 MED ORDER — VALSARTAN-HYDROCHLOROTHIAZIDE 160-25 MG PO TABS: 1.0000 | ORAL_TABLET | Freq: Every day | ORAL | Status: DC

## 2012-01-15 NOTE — Telephone Encounter (Signed)
Pt went to pharmacy and they didn't receive that refill on the meds that were going to be sent in. It was forDiavan (Generic) and another one that was going to be called in this am. He wasn't sure of the name.

## 2012-01-15 NOTE — Telephone Encounter (Signed)
Pt walked in earlier today stating his blood pressure was elevated at therapy today.  Pt states BP was 164/95 and 188/99.  No pulse was taken.  Per Dr. [name], we can add amlodipine 2.5 mg if systolic goes above 150.  Pt is told this and is agreeable to have this called in.  Advised pt to go home and rest and then check BP with home cuff per Dr. [name].  Pt is agreeable.   Valsartan RX re-sent and amlodipine sent to pharmacy.

## 2012-01-16 LAB — PROTIME-INR
INR: 1.9
Prothrombin Time: 22.2 secs — ABNORMAL HIGH (ref 11.5–14.7)

## 2012-01-16 LAB — CK TOTAL AND CKMB (NOT AT ARMC)
CK, Total: 190 U/L (ref 35–232)
CK-MB: 2 ng/mL (ref 0.5–3.6)

## 2012-01-16 LAB — TROPONIN I: Troponin-I: <0.02 ng/mL

## 2012-01-16 LAB — PRO B NATRIURETIC PEPTIDE: B-Type Natriuretic Peptide: 2499 pg/mL — ABNORMAL HIGH (ref 0–125)

## 2012-02-17 ENCOUNTER — Ambulatory Visit: Payer: Medicare Other | Admitting: Pulmonary Disease

## 2012-02-17 NOTE — Progress Notes (Signed)
  Subjective:    Patient ID: Chase Keller, male    DOB: Jun 21, 1940, 71 y.o.   MRN: [id number]  HPI  No show  Review of Systems     Objective:   Physical Exam        Assessment & Plan:

## 2012-02-24 ENCOUNTER — Encounter: Payer: Self-pay | Admitting: Pulmonary Disease

## 2012-02-24 ENCOUNTER — Ambulatory Visit (INDEPENDENT_AMBULATORY_CARE_PROVIDER_SITE_OTHER): Payer: Medicare Other | Admitting: Pulmonary Disease

## 2012-02-24 ENCOUNTER — Ambulatory Visit: Payer: Self-pay | Admitting: Pulmonary Disease

## 2012-02-24 VITALS — BP 114/62 | HR 45 | Temp 97.3°F | Ht 67.0 in | Wt 231.0 lb

## 2012-02-24 DIAGNOSIS — R0989 Other specified symptoms and signs involving the circulatory and respiratory systems: Secondary | ICD-10-CM

## 2012-02-24 DIAGNOSIS — G4733 Obstructive sleep apnea (adult) (pediatric): Secondary | ICD-10-CM

## 2012-02-24 DIAGNOSIS — R0600 Dyspnea, unspecified: Secondary | ICD-10-CM

## 2012-02-24 DIAGNOSIS — R059 Cough, unspecified: Secondary | ICD-10-CM | POA: Insufficient documentation

## 2012-02-24 DIAGNOSIS — R05 Cough: Secondary | ICD-10-CM

## 2012-02-24 DIAGNOSIS — R0602 Shortness of breath: Secondary | ICD-10-CM

## 2012-02-24 DIAGNOSIS — I509 Heart failure, unspecified: Secondary | ICD-10-CM

## 2012-02-24 DIAGNOSIS — I4891 Unspecified atrial fibrillation: Secondary | ICD-10-CM

## 2012-02-24 MED ORDER — FLUTICASONE PROPIONATE 50 MCG/ACT NA SUSP: 2.0000 | Freq: Every day | NASAL | Status: DC

## 2012-02-24 NOTE — Assessment & Plan Note (Signed)
He had a recent exacerbation related to his hypertension. His volume status has been fairly well controlled on his current dose of Lasix. His blood pressure is improved on his current regimen.  Plan: -continue to followup with Dr. Juliann Pares -we spoke at length today about the need to follow a cardiac diet and to keep his weight around 208-11 range.

## 2012-02-24 NOTE — Progress Notes (Signed)
Subjective:    Patient ID: Chase Keller, male    DOB: 1940/09/17, 70 y.o.   MRN: [id number]  Synopsis: Chase Keller is a 71 y/o male who first came to the [facility] in 2013 for evaluation of chronic cough.  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  HPI  05/12/11 4 week f/u-- Chase Keller returns for evaluation of his dry chronic cough.  He states that he thinks that the cough may have improved since we saw him last.  No sputum production.  He still notes that he has coughing fits while exposed to heavy dust.  He has not been using the albuterol inhaler and he has not tried the Nasonex samples.  07/24/11 ROV --His cough is much better. He took the nasal wash for two weeks, but then got a bloody nose so he stopped.  His cough was much better with nasal rinses, and picked up after he stopped using them.  Overall improved since the last visit.  He only used the nasonex three or four times.  02/24/2012 ROV -- Chase Keller returns today for followup of cough. He stopped taking his Flonase, Chlor-Trimeton, and saline rinses and not surprisingly his cough returned. He states that he only gets cough a few times a week after his been exposed to dust when doing something like a leaf blowing. The cough is not waking up at night and is not productive of sputum. He has an albuterol inhaler at home and he says that this has not helped. In the last few months she's had a pretty rough time. He had a knee replacement in July but developed atrial fibrillation after that. He said significant shortness of breath since then and trouble with his blood pressure. He went to the emergency room at least once for a CHF exacerbation in October. Since then his high blood pressure and his fluid status is been better  controlled with Lasix and a new hypertension regimen. He continues to have daytime somnolence despite the fact that he uses a CPAP machine at "the highest level possible".   Past Medical History  Diagnosis Date  . Hypertension   . Hyperlipidemia   . Left knee DJD   . Coronary artery disease   . Stented coronary artery   . S/P coronary artery bypass graft x 2   . PONV (postoperative nausea and vomiting)   . Sleep apnea     CPAP  . Heart murmur     / 1 MD said yes, 1 said no  . Chronic kidney disease     Kidney stone- passed  . Dysrhythmia     PA for Dr Thurston Hole said yes  . History of blood transfusion     during CABG  . MRSA (methicillin resistant staph aureus) culture positive     Drainage from Left Knee approx 2005  . Myocardial infarction     mild in 1998 per Wife   . Shortness of breath   . CHF (congestive heart failure)      Review of Systems  Constitutional: Positive for activity change and fatigue. Negative for fever and unexpected weight change.  HENT: Positive for congestion, rhinorrhea and postnasal drip.   Respiratory: Positive for cough and shortness of breath. Negative for wheezing.   Cardiovascular: Positive  for palpitations and leg swelling. Negative for chest pain.       Objective:   Physical Exam   Filed Vitals:   02/24/12 1156  BP: 114/62  Pulse: 45  Temp: 97.3 F (36.3 C)  TempSrc: Oral  Height: 5\' 7"  (1.702 m)  Weight: 231 lb (104.781 kg)  SpO2: 94%   Gen: well appearing, no acute distress HEENT: NCAT, PERRL, EOMi, OP dry but less erythema than prior Neck: supple without masses PULM: CTA B CV: RRR, no mgr, no JVD Ext: warm, no edema, no clubbing, no cyanosis   [facility] 04/2011 F/F Ratio 81%, FEV1 2.66 L (100%), TLC 5.21 L (90%), DLCO 93%, Flow loop with cough  Chest CT 04/24/2011: Normal lung parenchyma, several small nodules <80mm, cholelithiasis      Assessment & Plan:   Atrial fibrillation by electrocardiogram I think  this, his CHF, and deconditioning are contributing the most to his dyspnea.  His ventricular rate was controlled in the office today.  I think that his OSA is contributing.  Plan: -continue wafarin and b-blocker as written by Dr. Juliann Pares -needs a sleep specialist given OSA issues (see below)  CHF (congestive heart failure) He had a recent exacerbation related to his hypertension. His volume status has been fairly well controlled on his current dose of Lasix. His blood pressure is improved on his current regimen.  Plan: -continue to followup with Dr. Juliann Pares -we spoke at length today about the need to follow a cardiac diet and to keep his weight around 208-11 range.  Obstructive sleep apnea Chase Keller continues to have daytime somnolence despite the fact that his CPAP machine is "set on the highest level possible". This and the fact that he is required Nuvidual in the past makes me less comfortable managing his obstructive sleep apnea. I believe that his obstructive sleep apnea is not well controlled on his current regimen and is likely contributing to his atrial fibrillation.  Plan: -I will refer him to Dr. Marcelyn Bruins for further evaluation of his obstructive sleep apnea.  Cough This is really the least of his worries. As noted in multiple prior visits this is due to sinus congestion. Again he has stopped taking his nasal steroid and saline rinses. I explained to him that this is why he is coughing.  Plan: -Start saline rinses, Flonase, Chlor-Trimeton as these worked well in the past.  Shortness of breath I think that this is do to weight gain, deconditioning, CHF, and atrial fibrillation. Weight gain and deconditioning are the primary causes.  Plan: -We will obtain records of the pulmonary function test performed earlier in the year -We will check a chest x-ray to make sure that there is no evidence of ongoing pulmonary edema -I will refer him to pulmonary rehabilitation for his  CHF.   Updated Medication List Outpatient Encounter Prescriptions as of 02/24/2012  Medication Sig Dispense Refill  . albuterol (PROVENTIL HFA;VENTOLIN HFA) 108 (90 BASE) MCG/ACT inhaler Inhale 2 puffs into the lungs every 6 (six) hours as needed. For shortness of breath or wheezing      . amLODipine (NORVASC) 2.5 MG tablet Take 1 tablet (2.5 mg total) by mouth as needed. If systolic BP over 161.  30 tablet  0  . Ascorbic Acid (VITAMIN C) 1000 MG tablet Take 1,000 mg by mouth daily.      . CELEBREX 200 MG capsule Take 200 mg by mouth Daily.      . Cholecalciferol (VITAMIN D3) 5000 UNITS  CHEW Chew 5,000 Int'l Units by mouth daily.       . ferrous fumarate-iron polysaccharide complex (TANDEM) 162-115.2 MG CAPS Take 1 capsule by mouth daily with breakfast.  30 capsule  3  . furosemide (LASIX) 20 MG tablet Take 20 mg by mouth daily.      Marland Kitchen Hyaluronic Acid-Vitamin C (HYALURONIC ACID PO) Take 100 mg by mouth 2 (two) times daily.      Marland Kitchen HYDROcodone-acetaminophen (NORCO) 10-325 MG per tablet Take 1 tablet by mouth every 4 (four) hours as needed.      . labetalol (NORMODYNE) 100 MG tablet Take 1 tablet (100 mg total) by mouth 2 (two) times daily.  180 tablet  3  . Potassium 99 MG TABS Take 99 mg by mouth 2 (two) times daily.       . traMADol (ULTRAM) 50 MG tablet Take 1 tablet (50 mg total) by mouth every 8 (eight) hours as needed.  90 tablet  3  . UNABLE TO FIND Vit B-1, B-2, B-6, B-12      . valsartan-hydrochlorothiazide (DIOVAN-HCT) 160-25 MG per tablet Take 1 tablet by mouth daily.  90 tablet  3  . warfarin (COUMADIN) 1 MG tablet Take 3 tablets (3 mg total) by mouth as directed.  90 tablet  3

## 2012-02-24 NOTE — Assessment & Plan Note (Signed)
This is really the least of his worries. As noted in multiple prior visits this is due to sinus congestion. Again he has stopped taking his nasal steroid and saline rinses. I explained to him that this is why he is coughing.  Plan: -Start saline rinses, Flonase, Chlor-Trimeton as these worked well in the past.

## 2012-02-24 NOTE — Assessment & Plan Note (Signed)
Chase Keller continues to have daytime somnolence despite the fact that his CPAP machine is "set on the highest level possible". This and the fact that he is required Nuvidual in the past makes me less comfortable managing his obstructive sleep apnea. I believe that his obstructive sleep apnea is not well controlled on his current regimen and is likely contributing to his atrial fibrillation.  Plan: -I will refer him to Dr. Marcelyn Bruins for further evaluation of his obstructive sleep apnea.

## 2012-02-24 NOTE — Assessment & Plan Note (Signed)
I think this, his CHF, and deconditioning are contributing the most to his dyspnea.  His ventricular rate was controlled in the office today.  I think that his OSA is contributing.  Plan: -continue wafarin and b-blocker as written by Dr. Juliann Pares -needs a sleep specialist given OSA issues (see below)

## 2012-02-24 NOTE — Assessment & Plan Note (Signed)
I think that this is do to weight gain, deconditioning, CHF, and atrial fibrillation. Weight gain and deconditioning are the primary causes.  Plan: -We will obtain records of the pulmonary function test performed earlier in the year -We will check a chest x-ray to make sure that there is no evidence of ongoing pulmonary edema -I will refer him to pulmonary rehabilitation for his CHF.

## 2012-02-24 NOTE — Patient Instructions (Signed)
We will refer you to Dr. Shelle Iron for your obstructive sleep apnea We will refer you to pulmonary rehab We will have you get a Chest X-ray at [facility] for your shortness of breath Use Lloyd Huger Med rinses with distilled water at least twice per day using the instructions on the package. 1/2 hour after using the [facility] Med rinse, use Flonase two puffs in each nostril once per day. Use chlortrimeton and an over the counter decongestant (phenylephrine) as needed for the cough.  We will see you back in three months or sooner if needed

## 2012-02-25 ENCOUNTER — Encounter: Payer: Self-pay | Admitting: Internal Medicine

## 2012-02-25 ENCOUNTER — Ambulatory Visit: Payer: Medicare Other

## 2012-02-25 ENCOUNTER — Ambulatory Visit (INDEPENDENT_AMBULATORY_CARE_PROVIDER_SITE_OTHER): Payer: Medicare Other | Admitting: Internal Medicine

## 2012-02-25 VITALS — BP 120/72 | HR 61 | Temp 97.4°F | Resp 12 | Ht 67.0 in | Wt 223.5 lb

## 2012-02-25 DIAGNOSIS — E119 Type 2 diabetes mellitus without complications: Secondary | ICD-10-CM

## 2012-02-25 DIAGNOSIS — Z7901 Long term (current) use of anticoagulants: Secondary | ICD-10-CM

## 2012-02-25 DIAGNOSIS — E669 Obesity, unspecified: Secondary | ICD-10-CM

## 2012-02-25 DIAGNOSIS — I509 Heart failure, unspecified: Secondary | ICD-10-CM

## 2012-02-25 DIAGNOSIS — R7301 Impaired fasting glucose: Secondary | ICD-10-CM | POA: Insufficient documentation

## 2012-02-25 LAB — HEMOGLOBIN A1C
Hgb A1c MFr Bld: 6.3 % — ABNORMAL HIGH (ref <5.7)
Mean Plasma Glucose: 134 mg/dL — ABNORMAL HIGH (ref <117)

## 2012-02-25 LAB — PROTIME-INR
INR: 2.3 ratio — ABNORMAL HIGH (ref 0.8–1.0)
Prothrombin Time: 24 s — ABNORMAL HIGH (ref 10.2–12.4)

## 2012-02-25 NOTE — Assessment & Plan Note (Addendum)
EF unknown but presumed to be normal.  Recent episode of respiratory failure secondary to pulmonary edema was presumed to be due to diastolic dysfunction secondary to uncontrolled hypertension and atrial fibrillation. Records requested from Dr. Vennie Homans. Patient has been instructed to increase his Lasix to 40 mg daily until he is back down to his baseline weight of 206 pounds. At that point he will alternate and 20 mg and 40 mg. Chest x-ray done yesterday showed no pulmonary edema. If he has recurrent episodes of respiratory failure secondary to pulmonary edema he will need cardioversion

## 2012-02-25 NOTE — Patient Instructions (Addendum)
Resume 40 mg of lasix daily until home wt is 206.  Then change schedule to 20 mg alternating with 40 mg of lasix daily.    You are in danger of developing diabetes.  Losing weight will help you prevent diabetes and lose the weight that is aggravating your sleep apnea.   This is  my version of a  "Low GI"  Diet:  All of the foods can be found at grocery stores and in bulk at [employer].  The Atkins protein bars and shakes are available in more varieties at [employer], [employer] and [employer].     7 AM Breakfast:  Low carbohydrate Protein  Shakes (I recommend the [employer]AS AdvantEdge "Carb Control" shakes  Or the low carb shakes by Atkins.   Both are available everywhere:  In  cases at [employer]Js  Or in 4 packs at grocery stores and pharmacies  2.5 carbs  (Alternative is  a toasted Arnold's Sandwhich Thin w/ peanut butter, a "Bagel Thin" with cream cheese and salmon) or  a scrambled egg burrito made with a low carb tortilla .  Avoid cereal and bananas, oatmeal too unless you are cooking the old fashioned kind that takes 30-40 minutes to prepare.  the rest is overly processed, has minimal fiber, and is loaded with carbohydrates!   10 AM: Protein bar by Atkins (the snack size, under 200 cal).  There are many varieties , available widely again or in bulk in limited varieties at BJs)  Other so called "protein bars" tend to be loaded with carbohydrates.  Remember, in food advertising, the word "energy" is synonymous for " carbohydrate."  Lunch: sandwich of [place], (or any lunchmeat, grilled meat or canned tuna), fresh avocado, mayonnaise  and cheese on a lower carbohydrate pita bread, flatbread, or tortilla . Ok to use regular mayonnaise. The bread is the only source or carbohydrate that can be decreased (Joseph's makes a pita bread and a flat bread that are 50 cal and 4 net carbs ; Toufayan makes a low carb flatbread that's 100 cal and 9 net carbs  and  Mission makes a low carb whole wheat tortilla  That is 210 cal and 6 net  carbs)  3 PM:  Mid day :  Another protein bar,  Or a  cheese stick (100 cal, 0 carbs),  Or 1 ounce of  almonds, walnuts, pistachios, pecans, peanuts,  Macadamia nuts. Or a Dannon light n Fit greek yogurt, 80 cal 8 net carbs . Avoid "granola"; the dried cranberries and raisins are loaded with carbohydrates. Mixed nuts ok if no raisins or cranberries or dried fruit.      6 PM  Dinner:  "mean and green:"  Meat/chicken/fish or a high protein legume; , with a green salad, and a low GI  Veggie (broccoli, cauliflower, green beans, spinach, brussel sprouts. Lima beans) : Avoid "Low fat dressings, as well as Reyne Dumas and [address]! They are loaded with sugar! Instead use ranch, vinagrette,  Blue cheese, etc.  There is a low carb pasta by Dreamfield's available at [employer] that is acceptable and tastes great. Try Michel Angel's chicken piccata over low carb pasta. The chicken dish is 0 carbs, and can be found in frozen section at BJs and [employer]'s. Also try [employer] "Carnitas" (pulled pork, no sauce,  0 carbs) and his pot roast.   both are in the refrigerated section at BJs   9 PM snack : Breyer's "low carb" fudgsicle or  ice cream  bar (Carb Smart line), or  Weight Watcher's ice cream bar , or another "no sugar added" ice cream;a serving of fresh berries/cherries with whipped cream (Avoid bananas, pineapple, grapes  and watermelon on a regular basis because they are high in sugar)   Remember that snack Substitutions should be less than 15 to 20 carbs  Per serving. Remember to subtract fiber grams and sugar alcohols to get the "net carbs."

## 2012-02-25 NOTE — Progress Notes (Signed)
Patient ID: Chase Keller, male   DOB: 1941-03-05, 71 y.o.   MRN: [id number]  Patient Active Problem List  Diagnosis  . Hypothyroidism  . Hyperlipidemia  . Obstructive sleep apnea  . Hypertension  . Obesity (BMI 30-39.9)  . Left knee DJD  . Coronary artery disease  . Stented coronary artery  . S/P coronary artery bypass graft x 2  . Bradycardia  . Atrial fibrillation by electrocardiogram  . Incomplete right bundle branch block  . Leukocytosis  . CHF (congestive heart failure)  . Cough  . Shortness of breath  . Elevated fasting glucose  . Long term (current) use of anticoagulants  . Atrial fibrillation    Subjective:  CC:   Chief Complaint  Patient presents with  . Follow-up    HPI:   Chase Keller a 71 y.o. male who presents for follow up on chronic issues including hypertension, CAD,  OSA with daytime narcolepsy,  atrial fibrillation. Obesity,  And recent elevations in fasting glucose.  His last LDL was 110 on Red yeast Rice and he has a history of statin intolerance.  He had an ER visit for 3 weeks ago for hypoxia secondary to pulmonary edema. Per wife Chase Keller, his  bp was 210/110 and his  02 sats dropped into  50s without 02. However he was discharged home from the ER since he was scheduled to see his cardiologist Dr. Juliann Pares 3 hours later, at which time bp was 107/60.  [facility] has added amlodipine of 2.5 mg with prn dosing, along with a temporary increase in his daily lasixs dose to 40mg   for about 2 weeks. Last week Dr. Juliann Pares reduced his lasix dose to 20 mg daily and he notes that is retaining fluid because he has gained 3 or 4 lbs in the last week. 2 lbs by our scales.   cxr done yesterday was negative for pulmonary edema.    Past Medical History  Diagnosis Date  . Hypertension   . Hyperlipidemia   . Left knee DJD   . Coronary artery disease   . Stented coronary artery   . S/P coronary artery bypass graft x 2   . PONV (postoperative nausea and vomiting)    . Sleep apnea     CPAP  . Heart murmur     / 1 MD said yes, 1 said no  . Chronic kidney disease     Kidney stone- passed  . Dysrhythmia     PA for Dr Thurston Hole said yes  . History of blood transfusion     during CABG  . MRSA (methicillin resistant staph aureus) culture positive     Drainage from Left Knee approx 2005  . Myocardial infarction     mild in 1998 per Wife   . Shortness of breath   . CHF (congestive heart failure)   . Atrial fibrillation 2013    Past Surgical History  Procedure Date  . Ptca 2005    [name]  . Bone spurs     Back  . Coronary artery bypass graft 1988    2 vessel,  followed by [facility]  . Cardiac stents 2005  . 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 Total knee arthroplasty 10/13/2011    Procedure: TOTAL KNEE ARTHROPLASTY;  Surgeon: Nilda Simmer, MD;  Location[facility] OR;  Service: Orthopedics;  Laterality: Left;  left total knee arthroplasty    The following portions of the patient's history were reviewed  and updated as appropriate: Allergies, current medications, and problem list.    Review of Systems:   Review of Systems  Constitutional: Negative for fever and malaise/fatigue.  HENT: Negative for ear pain and congestion.   Eyes: Negative for blurred vision and redness.  Respiratory: Positive for shortness of breath. Negative for cough and wheezing.   Cardiovascular: Positive for palpitations. Negative for chest pain and PND.  Gastrointestinal: Negative for heartburn, nausea and abdominal pain.  Genitourinary: Negative for dysuria and frequency.  Musculoskeletal: Positive for joint pain. Negative for myalgias.  Skin: Negative for rash.  Neurological: Negative for dizziness, focal weakness, weakness and headaches.  Endo/Heme/Allergies: Bruises/bleeds easily.  Psychiatric/Behavioral: Negative for depression and memory loss.   ,     History   Social History  . Marital Status: Married    Spouse Name: N/A    Number of  Children: 0  . Years of Education: N/A   Occupational History  . Electrician - workes PT     has had previous exposure to metals and also to agent orange   Social History Main Topics  . Smoking status: Never Smoker   . Smokeless tobacco: Never Used     Comment: father smoked in the home while pt was growing up  . Alcohol Use: No  . Drug Use: No  . Sexually Active: Not on file   Other Topics Concern  . Not on file   Social History Narrative  . No narrative on file    Objective:  BP 120/72  Pulse 61  Temp 97.4 F (36.3 C) (Oral)  Resp 12  Ht 5\' 7"  (1.702 m)  Wt 223 lb 8 oz (101.379 kg)  BMI 35.01 kg/m2  SpO2 96%  General appearance: alert, cooperative and appears stated age Ears: normal TM's and external ear canals both ears Throat: lips, mucosa, and tongue normal; teeth and gums normal Neck: no adenopathy, no carotid bruit, supple, symmetrical, trachea midline and thyroid not enlarged, symmetric, no tenderness/mass/nodules Back: symmetric, no curvature. ROM normal. No CVA tenderness. Lungs: clear to auscultation bilaterally Heart: regular rate and rhythm, S1, S2 normal, no murmur, click, rub or gallop Abdomen: soft, non-tender; bowel sounds normal; no masses,  no organomegaly Pulses: 2+ and symmetric Skin: Skin color, texture, turgor normal. No rashes or lesions Lymph nodes: Cervical, supraclavicular, and axillary nodes normal.  Assessment and Plan:  CHF (congestive heart failure) EF unknown but presumed to be normal.  Recent episode of respiratory failure secondary to pulmonary edema was presumed to be due to diastolic dysfunction secondary to uncontrolled hypertension and atrial fibrillation. Records requested from Dr. Juliann Pares.  I have reviewed all the notes that are available on [facility].  Patient has been instructed to increase his Lasix to 40 mg daily until he is back down to his baseline weight of 206 pounds. At that point he will alternate and 20 mg and 40 mg.  Chest x-ray done yesterday showed no pulmonary edema. If he has recurrent episodes of respiratory failure secondary to pulmonary edema he will need cardioversion   Elevated fasting glucose His hemoglobin A1c was 6.3 indicative of plasma glucose levels in the 134 range. I have suggested to him today that he start eating like a diabetic. Low glycemic index diet handout was given. Weight loss of 10% of current body weight was recommended as his initial goal. He has been referred for cardiopulmonary respiratory therapy by Dr. Kendrick Fries.  Obesity (BMI 30-39.9) I have addressed  BMI and recommended a low glycemic  index diet utilizing smaller more frequent meals to increase metabolism.  I have also recommended that patient start the cardiopulmonary rehab program to increase his conditioning. Initial goal is 10% of body weight which is 20 pounds.   Long term (current) use of anticoagulants His INR is currently therapeutic, but he is about to start a low glycemic index diet which will no doubt affect his INR. He is been asked to return in 2 weeks for repeat INR.  A total of 40 minutes was spent with this patient today managing both acute and chronic issues and providing counseling.   Updated Medication List Outpatient Encounter Prescriptions as of 02/25/2012  Medication Sig Dispense Refill  . albuterol (PROVENTIL HFA;VENTOLIN HFA) 108 (90 BASE) MCG/ACT inhaler Inhale 2 puffs into the lungs every 6 (six) hours as needed. For shortness of breath or wheezing      . amLODipine (NORVASC) 2.5 MG tablet Take 1 tablet (2.5 mg total) by mouth as needed. If systolic BP over 811.  30 tablet  0  . Ascorbic Acid (VITAMIN C) 1000 MG tablet Take 1,000 mg by mouth daily.      . CELEBREX 200 MG capsule Take 200 mg by mouth Daily.      . Cholecalciferol (VITAMIN D3) 5000 UNITS CHEW Chew 5,000 Int'l Units by mouth daily.       . ferrous fumarate-iron polysaccharide complex (TANDEM) 162-115.2 MG CAPS Take 1 capsule by mouth  daily with breakfast.  30 capsule  3  . fluticasone (FLONASE) 50 MCG/ACT nasal spray Place 2 sprays into the nose daily.  16 g  2  . furosemide (LASIX) 20 MG tablet Take 20 mg by mouth daily.      Marland Kitchen Hyaluronic Acid-Vitamin C (HYALURONIC ACID PO) Take 100 mg by mouth 2 (two) times daily.      Marland Kitchen HYDROcodone-acetaminophen (NORCO) 10-325 MG per tablet Take 1 tablet by mouth every 4 (four) hours as needed.      . labetalol (NORMODYNE) 100 MG tablet Take 1 tablet (100 mg total) by mouth 2 (two) times daily.  180 tablet  3  . Potassium 99 MG TABS Take 99 mg by mouth 2 (two) times daily.       . traMADol (ULTRAM) 50 MG tablet Take 1 tablet (50 mg total) by mouth every 8 (eight) hours as needed.  90 tablet  3  . UNABLE TO FIND Vit B-1, B-2, B-6, B-12      . valsartan-hydrochlorothiazide (DIOVAN-HCT) 160-25 MG per tablet Take 1 tablet by mouth daily.  90 tablet  3  . warfarin (COUMADIN) 1 MG tablet Take 3 tablets (3 mg total) by mouth as directed.  90 tablet  3     Orders Placed This Encounter  Procedures  . Hemoglobin A1c  . Protime-INR    Return in about 1 month (around 03/26/2012).

## 2012-02-26 ENCOUNTER — Encounter: Payer: Self-pay | Admitting: Internal Medicine

## 2012-02-26 DIAGNOSIS — I4891 Unspecified atrial fibrillation: Secondary | ICD-10-CM | POA: Insufficient documentation

## 2012-02-26 NOTE — Assessment & Plan Note (Signed)
I have addressed  BMI and recommended a low glycemic index diet utilizing smaller more frequent meals to increase metabolism.  I have also recommended that patient start the cardiopulmonary rehab program to increase his conditioning. Initial goal is 10% of body weight which is 20 pounds.

## 2012-02-26 NOTE — Assessment & Plan Note (Signed)
His INR is currently therapeutic, but he is about to start a low glycemic index diet which will no doubt affect his INR. He is been asked to return in 2 weeks for repeat INR.

## 2012-02-26 NOTE — Assessment & Plan Note (Addendum)
His hemoglobin A1c was 6.3 indicative of plasma glucose levels in the 134 range. I have suggested to him today that he start eating like a diabetic. Low glycemic index diet handout was given. Weight loss of 10% of current body weight was recommended as his initial goal. He has been referred for cardiopulmonary respiratory therapy by Dr. Kendrick Fries.

## 2012-03-02 ENCOUNTER — Telehealth: Payer: Self-pay | Admitting: *Deleted

## 2012-03-02 NOTE — Telephone Encounter (Signed)
Spoke with pt's spouse and notified of results per Dr. Kendrick Fries She verbalized understanding, denied any questions, and will inform the pt.

## 2012-03-02 NOTE — Telephone Encounter (Signed)
Message copied by Christen Butter on Tue Mar 02, 2012 10:17 AM ------      Message from: Lupita Leash      Created: Tue Mar 02, 2012 10:05 AM       L,            Please let him know that his CXR was OK            B

## 2012-03-15 ENCOUNTER — Encounter: Payer: Self-pay | Admitting: Pulmonary Disease

## 2012-03-15 ENCOUNTER — Ambulatory Visit (INDEPENDENT_AMBULATORY_CARE_PROVIDER_SITE_OTHER): Payer: Medicare Other | Admitting: Pulmonary Disease

## 2012-03-15 VITALS — BP 128/60 | HR 67 | Temp 97.8°F | Ht 67.5 in | Wt 218.2 lb

## 2012-03-15 DIAGNOSIS — G4733 Obstructive sleep apnea (adult) (pediatric): Secondary | ICD-10-CM

## 2012-03-15 NOTE — Patient Instructions (Addendum)
Will have your dme company check your machine and see what pressure it is actually putting out. Would like to try a full face mask to eliminate the issue with mouth opening. Will get you an automatic machine on loan for the next 2-3 weeks, and let it re-optimize your pressure.  I will call you with the results once I receive your report. If we make sure that everything is functioning properly, and you still have sleepiness during the day, then it is not related to your sleep apnea. Work on weight loss as much as possible.  As your weight decreases, your sleep apnea improves and you require less pressure.

## 2012-03-15 NOTE — Assessment & Plan Note (Signed)
The patient has a long history of obstructive sleep apnea, and has been on CPAP since that time.  His current machine is fairly new, and he has not had any large weight changes over the last few years.  He feels that his fit with his nasal mask is adequate, but he is having a lot of mouth opening with pressure loss.  Perhaps this is the etiology for his daytime sleepiness.  He states that he is wearing the device compliantly, and that he is rested in the mornings upon arising.  However, he clearly is having significant daytime sleepiness for unknown reason.  It is unclear whether this is because of equipment issues, pressure leak, or whether he is requiring a higher therapeutic pressure.  If we work through all of this, and everything appears to be functioning properly, it may be that his sleepiness has nothing to do with his sleep disordered breathing.  Will see the pt back after we have evaluated all of this.

## 2012-03-15 NOTE — Progress Notes (Signed)
Subjective:    Patient ID: Chase Keller, male    DOB: June 15, 1940, 71 y.o.   MRN: [id number]  HPI The patient is a 71 year old male who I have been asked to see for management of obstructive sleep apnea.  He was diagnosed with sleep apnea over 10 years ago, and has been treated with CPAP since that time.  He states that his current machine is about 71 years old, and that his last sleep study was approximately 2-3 years ago where his pressure was optimized.  He tells me that he requires a very high pressure level, and that his CPAP machine is currently between 19 and 21 cm of water.  He currently uses a nasal CPAP mask, but admits to frequent mouth opening.  His wife hears air leaking and abnormal noises all night according to her history.  The patient denies any significant awakenings at night and feels rested in the mornings upon arising.  However, he notes significant sleep pressure during the day if he sits down to watch TV or read.  His wife states that he falls asleep very easily with any inactivity during the day.  He denies any driving issues over short distances, but admits to sleep pressure with longer distances.  The patient states that his weight is down 10 pounds over the last 2 years, and his Epworth score today is abnormal at 15.  Sleep Questionnaire: What time do you typically go to bed?( Between what hours) 12am-2am How long does it take you to fall asleep? How many times during the night do you wake up? 0 What time do you get out of bed to start your day? 0800 Do you drive or operate heavy machinery in your occupation? No How much has your weight changed (up or down) over the past two years? (In pounds) 12 lb (5.443 kg) Have you ever had a sleep study before? Yes If yes, location of study? [place] across from [facility] If yes, date of study? 2010-2011 Do you currently use CPAP? Yes If so, what pressure? 21 HIGHEST LEVEL Do you wear oxygen at any time? No    Review of Systems   Constitutional: Positive for fatigue ( very little energy) and unexpected weight change. Negative for fever.  HENT: Positive for congestion. Negative for ear pain, nosebleeds, sore throat, rhinorrhea, sneezing, trouble swallowing, dental problem, postnasal drip and sinus pressure.   Eyes: Negative for redness and itching.  Respiratory: Positive for cough and shortness of breath. Negative for chest tightness and wheezing.   Cardiovascular: Positive for chest pain and palpitations ( patient has A-Fib). Negative for leg swelling.  Gastrointestinal: Negative for nausea and vomiting.  Genitourinary: Negative for dysuria.  Musculoskeletal: Positive for joint swelling and arthralgias.       Hand/feet swelling  Skin: Negative for rash.  Neurological: Negative for headaches.  Hematological: Does not bruise/bleed easily.  Psychiatric/Behavioral: Positive for agitation. Negative for dysphoric mood. The patient is not nervous/anxious.        Objective:   Physical Exam Constitutional:  Overweight male, no acute distress  HENT:  Nares patent without discharge  Oropharynx without exudate, palate and uvula are mildly elongated.   Eyes:  Perrla, eomi, no scleral icterus  Neck:  No JVD, no TMG  Cardiovascular:  Normal rate, slightly irregular rhythm, no rubs or gallops.  No murmurs        Intact distal pulses  Pulmonary :  Diminished breath sounds, no stridor or respiratory distress   No  rales, rhonchi, or wheezing  Abdominal:  Soft, nondistended, bowel sounds present.  No tenderness noted.   Musculoskeletal:  1+ lower extremity edema noted.  Lymph Nodes:  No cervical lymphadenopathy noted  Skin:  No cyanosis noted  Neurologic:  Alert, does not appear to be sleepy, appropriate, moves all 4 extremities without obvious deficit.         Assessment & Plan:

## 2012-03-16 ENCOUNTER — Encounter: Payer: Self-pay | Admitting: Pulmonary Disease

## 2012-03-17 ENCOUNTER — Telehealth: Payer: Self-pay | Admitting: Pulmonary Disease

## 2012-03-17 NOTE — Telephone Encounter (Signed)
Phone call to patient in regards to last ov with KC. LMOMTCB and speak to Chase Keller.  Patient states that he has a sleep study in [place], [place] at a sleep med center near [facility]? I have called 3 different places in [place] and unable to locate sleep study.  We need to find out if patient actually ever had Sleep Study and where they might have been seen.

## 2012-03-18 ENCOUNTER — Other Ambulatory Visit: Payer: Self-pay

## 2012-03-18 NOTE — Telephone Encounter (Signed)
I have looked all throughout patients chart and I am unable to find Sleep Study that was done 2-3 years ago. I have also contacted sleep centers in [place] around [facility] and no one has records on patient ever having sleep study. I called and left patient a message verifying that he did for sure have sleep study done in [place] and patient verified plus states that [facility]'s office should have the records. I have contacted Dr Melina Schools office and left message for nurse to return call. Study possibly in paper chart at [facility] office.   Will keep you updated and inform you once I locate this study.

## 2012-03-18 NOTE — Telephone Encounter (Signed)
Pt returned Chase Keller's call.  Pt states he had a sleep study done 3 years ago in [place].  Pt states that Dr. [name] should have this in his file.  Antionette Fairy

## 2012-03-19 ENCOUNTER — Telehealth: Payer: Self-pay | Admitting: Pulmonary Disease

## 2012-03-19 NOTE — Telephone Encounter (Signed)
ATC number, no answer.  Company name on [employer]".  LMOMTCB for [employer]

## 2012-03-19 NOTE — Telephone Encounter (Signed)
ATC Valerie x1 > line rang greater than 10x with no answer.  What company is she with?

## 2012-03-22 NOTE — Telephone Encounter (Signed)
Per pt's spouse, this has been set up for Thurs., 03/25/12 with [employer] [facility] and nothing further is needed.

## 2012-03-22 NOTE — Telephone Encounter (Signed)
Returning call can be reached at [phone] will be home until 5:30p.Chase Keller

## 2012-03-22 NOTE — Telephone Encounter (Signed)
LMTCB x 1 for Chase Keller, Per coworker, Chase Keller is out of the office until Thurs., 03/25/12 and she will leave msg for her to return triage call.  Also LMTCB x 1 for the pt so he is aware [employer] has been trying to contact him regarding this matter. Or ask if this has been taken care of.

## 2012-03-23 ENCOUNTER — Encounter: Payer: Self-pay | Admitting: Pulmonary Disease

## 2012-03-24 ENCOUNTER — Ambulatory Visit (INDEPENDENT_AMBULATORY_CARE_PROVIDER_SITE_OTHER): Payer: Medicare Other | Admitting: Internal Medicine

## 2012-03-24 ENCOUNTER — Encounter: Payer: Self-pay | Admitting: Internal Medicine

## 2012-03-24 VITALS — BP 120/62 | HR 62 | Temp 97.6°F | Resp 12 | Ht 66.5 in | Wt 219.2 lb

## 2012-03-24 DIAGNOSIS — Z79899 Other long term (current) drug therapy: Secondary | ICD-10-CM

## 2012-03-24 DIAGNOSIS — Z7901 Long term (current) use of anticoagulants: Secondary | ICD-10-CM

## 2012-03-24 DIAGNOSIS — Z23 Encounter for immunization: Secondary | ICD-10-CM

## 2012-03-24 DIAGNOSIS — I4891 Unspecified atrial fibrillation: Secondary | ICD-10-CM

## 2012-03-24 DIAGNOSIS — I10 Essential (primary) hypertension: Secondary | ICD-10-CM

## 2012-03-24 DIAGNOSIS — E669 Obesity, unspecified: Secondary | ICD-10-CM

## 2012-03-24 NOTE — Progress Notes (Signed)
Patient ID: Chase Keller, male   DOB: July 25, 1940, 71 y.o.   MRN: [id number]  Patient Active Problem List  Diagnosis  . Hypothyroidism  . Hyperlipidemia  . Obstructive sleep apnea  . Hypertension  . Obesity (BMI 30-39.9)  . Left knee DJD  . Coronary artery disease  . Stented coronary artery  . S/P coronary artery bypass graft x 2  . Bradycardia  . Incomplete right bundle branch block  . Leukocytosis  . CHF (congestive heart failure)  . Cough  . Shortness of breath  . Elevated fasting glucose  . Long term (current) use of anticoagulants  . Atrial fibrillation    Subjective:  CC:   No chief complaint on file.   HPI:   Chase Imhoff Pattersonis a 71 y.o. male who presents Follow up on chronic conditions including obesity, diabetes, and OSA.  He has not been following  low GI diet bc of the dietary restrictions of coumadin so he has not lost any weight .  He is scheduled for a CPAP titration study on Thursday .    Past Medical History  Diagnosis Date  . Hypertension   . Hyperlipidemia   . Left knee DJD   . Coronary artery disease   . Stented coronary artery   . S/P coronary artery bypass graft x 2   . PONV (postoperative nausea and vomiting)   . Sleep apnea     CPAP  . Heart murmur     / 1 MD said yes, 1 said no  . Chronic kidney disease     Kidney stone- passed  . Dysrhythmia     PA for Dr Thurston Hole said yes  . History of blood transfusion     during CABG  . MRSA (methicillin resistant staph aureus) culture positive     Drainage from Left Knee approx 2005  . Myocardial infarction     mild in 1998 per Wife   . Shortness of breath   . CHF (congestive heart failure)   . Atrial fibrillation 2013    Past Surgical History  Procedure Date  . Ptca 2005    [name]  . Bone spurs     Back  . Coronary artery bypass graft 1988    2 vessel,  followed by [facility]  . Cardiac stents 2005  . 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 Total knee arthroplasty  10/13/2011    Procedure: TOTAL KNEE ARTHROPLASTY;  Surgeon: Nilda Simmer, MD;  Location: [facility] OR;  Service: Orthopedics;  Laterality: Left;  left total knee arthroplasty         The following portions of the patient's history were reviewed and updated as appropriate: Allergies, current medications, and problem list.    Review of Systems:   Patient denies headache, fevers, malaise, unintentional weight loss, skin rash, eye pain, sinus congestion and sinus pain, sore throat, dysphagia,  hemoptysis , cough, dyspnea, wheezing, chest pain, palpitations, orthopnea, edema, abdominal pain, nausea, melena, diarrhea, constipation, flank pain, dysuria, hematuria, urinary  Frequency, nocturia, numbness, tingling, seizures,  Focal weakness, Loss of consciousness,  Tremor, insomnia, depression, anxiety, and suicidal ideation.      History   Social History  . Marital Status: Married    Spouse Name: N/A    Number of Children: 0  . Years of Education: N/A   Occupational History  . Electrician - workes [employer]     has had previous exposure to metals and also to agent  orange   Social History Main Topics  . Smoking status: Never Smoker   . Smokeless tobacco: Never Used     Comment: father smoked in the home while pt was growing up//ex-wife smoked  . Alcohol Use: No  . Drug Use: No  . Sexually Active: Not on file   Other Topics Concern  . Not on file   Social History Narrative  . No narrative on file    Objective:  BP 120/62  Pulse 62  Temp 97.6 F (36.4 C) (Oral)  Resp 12  Ht 5' 6.5" (1.689 m)  Wt 219 lb 4 oz (99.451 kg)  BMI 34.86 kg/m2  SpO2 95%  General appearance: alert, cooperative and appears stated age Ears: normal TM's and external ear canals both ears Throat: lips, mucosa, and tongue normal; teeth and gums normal Neck: no adenopathy, no carotid bruit, supple, symmetrical, trachea midline and thyroid not enlarged, symmetric, no tenderness/mass/nodules Back: symmetric, no  curvature. ROM normal. No CVA tenderness. Lungs: clear to auscultation bilaterally Heart: regular rate and rhythm, S1, S2 normal, no murmur, click, rub or gallop Abdomen: soft, non-tender; bowel sounds normal; no masses,  no organomegaly Pulses: 2+ and symmetric Skin: Skin color, texture, turgor normal. No rashes or lesions Lymph nodes: Cervical, supraclavicular, and axillary nodes normal.  Assessment and Plan:  Atrial fibrillation Rate controlled,  Anticoagulated with coumadin.   Hypertension Well controlled on current medications.  No changes today.  Long term (current) use of anticoagulants A full discussion of the nature of anticoagulants has been carried out.  Healthy diet has been recommended, with avoidance of swings in diet.  Coumadin level is therapeutic,  Continue current regimen and repeat [name] in two weeks once he resumes his regular diet.    Obesity (BMI 30-39.9) I have addressed  BMI and recommended a low glycemic index diet utilizing smaller more frequent meals to increase metabolism.  I have also recommended that patient start exercising with a goal of 30 minutes of aerobic exercise a minimum of 5 days per week.    Updated Medication List Outpatient Encounter Prescriptions as of 03/24/2012  Medication Sig Dispense Refill  . albuterol (PROVENTIL HFA;VENTOLIN HFA) 108 (90 BASE) MCG/ACT inhaler Inhale 2 puffs into the lungs every 6 (six) hours as needed. For shortness of breath or wheezing      . amLODipine (NORVASC) 2.5 MG tablet Take 2.5 mg by mouth as needed. If systolic BP over 454.      . Ascorbic Acid (VITAMIN C) 1000 MG tablet Take 1,000 mg by mouth daily.      . CELEBREX 200 MG capsule Take 200 mg by mouth Daily.      . Cholecalciferol (VITAMIN D3) 5000 UNITS CHEW Chew 5,000 Int'l Units by mouth daily.       . ferrous fumarate-iron polysaccharide complex (TANDEM) 162-115.2 MG CAPS Take 1 capsule by mouth daily with breakfast.  30 capsule  3  . fluticasone  (FLONASE) 50 MCG/ACT nasal spray Place 2 sprays into the nose daily.  16 g  2  . furosemide (LASIX) 20 MG tablet Take 20 mg by mouth daily.      Marland Kitchen Hyaluronic Acid-Vitamin C (HYALURONIC ACID PO) Take 100 mg by mouth 2 (two) times daily.      Marland Kitchen HYDROcodone-acetaminophen (NORCO/VICODIN) 5-325 MG per tablet Take 1 tablet by mouth every 6 (six) hours as needed.      . labetalol (NORMODYNE) 100 MG tablet Take 1 tablet (100 mg total) by mouth 2 (two)  times daily.  180 tablet  3  . Melatonin 3 MG TABS Take 1 tablet by mouth at bedtime.      . niacin 500 MG CR capsule Take 500 mg by mouth at bedtime.      . Potassium 99 MG TABS Take 99 mg by mouth 2 (two) times daily.       . traMADol (ULTRAM) 50 MG tablet Take 1 tablet (50 mg total) by mouth every 8 (eight) hours as needed.  90 tablet  3  . UNABLE TO FIND Vit B-1, B-2, B-6, B-12      . valsartan-hydrochlorothiazide (DIOVAN-HCT) 160-25 MG per tablet Take 1 tablet by mouth daily.  90 tablet  3  . warfarin (COUMADIN) 1 MG tablet Take 3 tablets (3 mg total) by mouth as directed.  90 tablet  3     Orders Placed This Encounter  Procedures  . Pneumococcal polysaccharide vaccine 23-valent greater than or equal to 2yo subcutaneous/IM  . Protime-INR  . Comprehensive metabolic panel    No Follow-up on file.

## 2012-03-24 NOTE — Telephone Encounter (Signed)
Patient returning call.

## 2012-03-24 NOTE — Patient Instructions (Addendum)
You received the pneumonia vaccine today.  This is good for life.   Eat a healthy diet.  We will adjust your coumadin level to fit your healthy diet/lifestyle.  Once you get back on your regular diet ,  Return for a coumadin check 2 weeks later  Return for offive visit in early march,.  fasting lipids in late February

## 2012-03-24 NOTE — Telephone Encounter (Signed)
i spoke with Chase Keller. He stated he had a left message from 03/22/12. I advised him one of the other nurses have spoken with his spouse since then. He voiced his understanding. [name], did u ever receive the study so we can close this message thanks

## 2012-03-25 LAB — PROTIME-INR
INR: 2.6 ratio — ABNORMAL HIGH (ref 0.8–1.0)
Prothrombin Time: 27 s — ABNORMAL HIGH (ref 10.2–12.4)

## 2012-03-25 LAB — COMPREHENSIVE METABOLIC PANEL
ALT: 20 U/L (ref 0–53)
AST: 24 U/L (ref 0–37)
Albumin: 4.5 g/dL (ref 3.5–5.2)
Alkaline Phosphatase: 56 U/L (ref 39–117)
BUN: 31 mg/dL — ABNORMAL HIGH (ref 6–23)
CO2: 31 mEq/L (ref 19–32)
Calcium: 9.8 mg/dL (ref 8.4–10.5)
Chloride: 101 mEq/L (ref 96–112)
Creatinine, Ser: 1.1 mg/dL (ref 0.4–1.5)
GFR: 67.28 mL/min (ref >60.00)
Glucose, Bld: 125 mg/dL — ABNORMAL HIGH (ref 70–99)
Potassium: 3.3 mEq/L — ABNORMAL LOW (ref 3.5–5.1)
Sodium: 140 mEq/L (ref 135–145)
Total Bilirubin: 1.1 mg/dL (ref 0.3–1.2)
Total Protein: 7.7 g/dL (ref 6.0–8.3)

## 2012-03-25 NOTE — Telephone Encounter (Signed)
I spoke with Dr Melina Schools office and they have no record of a study done 2-3 years ago in their office and there is nothing scanned into epic. After calling all the sleep centers in [place](which is where the patient said he had the test done) they have no record of him anywhere of having a test performed in the last 2-3 years or ever. In our system it actually says that he cancelled his last scheduled sleep study.   Message to be sent to [facility] as FYI that we were unable to locate his previous studies.   Dr Shelle Iron please advise. Thanks.

## 2012-03-25 NOTE — Telephone Encounter (Signed)
Noted.  If there is an ongoing question, may need to repeat his sleep study.

## 2012-03-26 ENCOUNTER — Encounter: Payer: Self-pay | Admitting: Pulmonary Disease

## 2012-03-28 ENCOUNTER — Encounter: Payer: Self-pay | Admitting: Internal Medicine

## 2012-03-28 NOTE — Assessment & Plan Note (Signed)
I have addressed  BMI and recommended a low glycemic index diet utilizing smaller more frequent meals to increase metabolism.  I have also recommended that patient start exercising with a goal of 30 minutes of aerobic exercise a minimum of 5 days per week.  

## 2012-03-28 NOTE — Assessment & Plan Note (Signed)
Well controlled on current medications.  No changes today. 

## 2012-03-28 NOTE — Assessment & Plan Note (Signed)
A full discussion of the nature of anticoagulants has been carried out.  Healthy diet has been recommended, with avoidance of swings in diet.  Coumadin level is therapeutic,  Continue current regimen and repeat [name] in two weeks once he resumes his regular diet.

## 2012-03-28 NOTE — Assessment & Plan Note (Signed)
Rate controlled,  Anticoagulated with coumadin.

## 2012-04-07 ENCOUNTER — Encounter: Payer: Self-pay | Admitting: Pulmonary Disease

## 2012-04-11 ENCOUNTER — Other Ambulatory Visit: Payer: Self-pay | Admitting: Internal Medicine

## 2012-04-13 ENCOUNTER — Other Ambulatory Visit: Payer: Self-pay | Admitting: *Deleted

## 2012-04-13 DIAGNOSIS — Z7901 Long term (current) use of anticoagulants: Secondary | ICD-10-CM

## 2012-04-14 ENCOUNTER — Other Ambulatory Visit (INDEPENDENT_AMBULATORY_CARE_PROVIDER_SITE_OTHER): Payer: Medicare PPO

## 2012-04-14 DIAGNOSIS — Z7901 Long term (current) use of anticoagulants: Secondary | ICD-10-CM

## 2012-04-14 LAB — PROTIME-INR
INR: 2.5 ratio — ABNORMAL HIGH (ref 0.8–1.0)
Prothrombin Time: 25.7 s — ABNORMAL HIGH (ref 10.2–12.4)

## 2012-04-14 NOTE — Telephone Encounter (Signed)
meds filled

## 2012-04-21 ENCOUNTER — Telehealth: Payer: Self-pay | Admitting: *Deleted

## 2012-04-21 DIAGNOSIS — G4733 Obstructive sleep apnea (adult) (pediatric): Secondary | ICD-10-CM

## 2012-04-21 NOTE — Telephone Encounter (Signed)
Fax received from [employer] [facility] in regards to patients titration study. Few questions in regards to supplies.  Fax has been placed in green folder for review.   Dr Shelle Iron please advise. Thanks.

## 2012-04-21 NOTE — Telephone Encounter (Signed)
I ordered an auto titration study with pressure 5-20cm for 2 weeks, with download to me.  Not bipap.

## 2012-04-24 ENCOUNTER — Other Ambulatory Visit: Payer: Self-pay | Admitting: Pulmonary Disease

## 2012-04-24 DIAGNOSIS — G4733 Obstructive sleep apnea (adult) (pediatric): Secondary | ICD-10-CM

## 2012-04-27 NOTE — Telephone Encounter (Signed)
Orders have been placed. Message to be routed back to [facility] as FYI.

## 2012-04-27 NOTE — Telephone Encounter (Signed)
Patient also stated that [facility] at [employer] [facility] states that when she places his cpap machine on Auto that it cuts off after about 1-2 minutes of use. Pt will reset machine and it cuts off again. Pt states that he has had the machine for about 7 yrs.  1) Please send order to [facility] for new cpap machine and  2) an order for a mask fit at the sleep lab.  Pt is aware of order and of mask fit appointment on 05/04/12 Ollen Gross

## 2012-04-27 NOTE — Telephone Encounter (Signed)
Spoke with Education officer, environmental at [facility] [facility] [phone]) she stated that patient was using the full face mask while doing the auto titration study. Pt is willing to go to the sleep lab and have Marita Kansas fit him with mask on 05/04/12. After the mask fit, I will send the order to [employer] to repeat the auto titration study for 2 weeks. Patient is aware. Rhonda J Cobb

## 2012-04-28 NOTE — Telephone Encounter (Signed)
Returning Rhonda's call can be reached at [phone].Raylene Everts

## 2012-04-28 NOTE — Telephone Encounter (Signed)
Pt states sleep lab in [place] is faxing over his sleep results from 5-6 yrs ago.Raylene Everts

## 2012-04-28 NOTE — Telephone Encounter (Signed)
Per Bjorn Loser, records have been faxed over for your review...patient is on BiPap per [employer] [facility] [facility] and auto titration has been ordered to be done done after patient has mask fitting.  See messages below for update. Papers are in your Akashdeep Chuba folder for review.   Please advise Dr Shelle Iron. Thanks.

## 2012-04-28 NOTE — Telephone Encounter (Signed)
Called and spoke with Cyril Loosen at [employer] [facility] [facility] and she stated that pt is currently on a BiPap at a pressure of 21/19. I have asked her to fax any sleep studies patient may have had.  I have given this to [name] to place in Dr. Teddy Spike look at folder. Please advise if once the patient has mask fit with Marita [place] on 05/04/12, do we need to repeat Auto CPAP or Auto BiPap? Rhonda J Cobb

## 2012-04-29 NOTE — Telephone Encounter (Signed)
Received study from [facility]. This has been placed in your green folder for your review.   Dr Shelle Iron please advise.

## 2012-05-04 ENCOUNTER — Encounter: Payer: Self-pay | Admitting: Pulmonary Disease

## 2012-05-04 ENCOUNTER — Other Ambulatory Visit (HOSPITAL_BASED_OUTPATIENT_CLINIC_OR_DEPARTMENT_OTHER): Payer: Medicare PPO

## 2012-05-08 ENCOUNTER — Encounter: Payer: Self-pay | Admitting: Pulmonary Disease

## 2012-05-10 ENCOUNTER — Ambulatory Visit (HOSPITAL_BASED_OUTPATIENT_CLINIC_OR_DEPARTMENT_OTHER): Payer: Medicare PPO | Attending: Pulmonary Disease

## 2012-05-31 ENCOUNTER — Telehealth: Payer: Self-pay | Admitting: *Deleted

## 2012-05-31 NOTE — Telephone Encounter (Signed)
Patient CPAP auto results have been received- in your Winfield Caba folder for review.

## 2012-06-05 ENCOUNTER — Encounter: Payer: Self-pay | Admitting: Pulmonary Disease

## 2012-06-23 ENCOUNTER — Telehealth: Payer: Self-pay | Admitting: Pulmonary Disease

## 2012-06-23 NOTE — Telephone Encounter (Signed)
KC please advise. Results are still in your Donelle Baba folder. Patients wife stopped by office to check on status of this. Pt was not very happy that they have not heard anything back in regards to this. [employer] [facility] has told patient that they have seen [place] multiple faxes and have called our office multiple times in the last couples weeks but there is nothing documented and the form in your Bach [place] folder are the only papers that i have received on this patient.  Please advise KC of any recs and if there are any orders that need to be placed for this patient to [facility] [facility]. Patient is aware that you will not be back into office until [facility] 3/20

## 2012-06-24 NOTE — Telephone Encounter (Signed)
I ordered a new cpap machine for him on 04/27/2012.  See computer under referral The problems that he is having is with his dme, not [place].  Let him know we sent the order for the new machine in [place].

## 2012-06-24 NOTE — Telephone Encounter (Signed)
I spoke with pt and he stated his CPAP machine is 72 years old. It keeps cutting off in the middle of the night. He is due for a new one. He is requesting an order be sent. He has not took the machine to his DME since it is this old. Please advise KC thanks

## 2012-06-25 ENCOUNTER — Other Ambulatory Visit: Payer: Self-pay | Admitting: Pulmonary Disease

## 2012-06-25 DIAGNOSIS — G4733 Obstructive sleep apnea (adult) (pediatric): Secondary | ICD-10-CM

## 2012-06-25 NOTE — Telephone Encounter (Signed)
New CPAP order was faxed to [facility] on 04/27/12. Called and spoke with Vikki Ports at [facility] and she stated that the patient is on a BiPap and not a CPAP. The order that was placed in January did not have the pressure setting. I asked her if the CPAP Auto was ever done and she is faxing over that report. However, she wants Dr. Shelle Iron to be aware that the patient is not currently on CPAP but a BiPap. Need clarification of order. Rhonda J Cobb

## 2012-06-25 NOTE — Telephone Encounter (Signed)
This has been taken care of.  Order sent to place pt's cpap machine on pressure 12cm.

## 2012-06-25 NOTE — Telephone Encounter (Signed)
I spoke with pt and he stated they never set him up with a new machine. No one contacted him. Please advise [facility]'s rthanks

## 2012-06-28 ENCOUNTER — Ambulatory Visit: Payer: Medicare Other | Admitting: Internal Medicine

## 2012-06-28 ENCOUNTER — Encounter: Payer: Self-pay | Admitting: Internal Medicine

## 2012-06-28 ENCOUNTER — Ambulatory Visit (INDEPENDENT_AMBULATORY_CARE_PROVIDER_SITE_OTHER): Payer: Medicare PPO | Admitting: Internal Medicine

## 2012-06-28 VITALS — BP 126/64 | HR 56 | Temp 98.0°F | Resp 16 | Wt 217.5 lb

## 2012-06-28 DIAGNOSIS — Z7901 Long term (current) use of anticoagulants: Secondary | ICD-10-CM

## 2012-06-28 DIAGNOSIS — I10 Essential (primary) hypertension: Secondary | ICD-10-CM

## 2012-06-28 DIAGNOSIS — E669 Obesity, unspecified: Secondary | ICD-10-CM

## 2012-06-28 DIAGNOSIS — E119 Type 2 diabetes mellitus without complications: Secondary | ICD-10-CM

## 2012-06-28 DIAGNOSIS — G4733 Obstructive sleep apnea (adult) (pediatric): Secondary | ICD-10-CM

## 2012-06-28 NOTE — Patient Instructions (Addendum)
I would like you to lose 21 lbs,  Or 10%  Of your current body weight over the next 6 months  With diet and exercise   This is  One version of a  "Low GI"  Diet:  It will still lower your blood sugars and allow you to lose 5 to 10 lbs  per month if you follow it carefully and combine it with 30 minutes of aerobic exercise 5 days per week .   All of the foods can be found at grocery stores and in bulk at [employer].  The Atkins protein bars and shakes are available in more varieties at [employer], [employer] and [employer].     7 AM Breakfast:  Choose from the following:  Low carbohydrate Protein  Shakes (I recommend the EAS AdvantEdge "Carb Control" shakes  Or the low carb shakes by Atkins.    2.5 carbs   Arnold's "Sandwhich Thin"toasted  w/ peanut butter (no jelly: about 20 net carbs  "Bagel Thin" with cream cheese and salmon: about 20 carbs   a scrambled egg/bacon/cheese burrito made with Mission's "carb balance" whole wheat tortilla  (about 10 net carbs )   Avoid cereal and bananas, oatmeal and cream of wheat and grits. They are loaded with carbohydrates!   10 AM: high protein snack  Protein bar by Atkins (the snack size, under 200 cal, usually < 6 net carbs).    A stick of cheese:  Around 1 carb,  100 cal     Dannon Light n Fit [place] Yogurt  (80 cal, 8 carbs)  Other so called "protein bars" and Greek yogurts tend to be loaded with carbohydrates.  Remember, in food advertising, the word "energy" is synonymous for " carbohydrate."  Lunch:   A Sandwich using the bread choices listed, Can use any  Eggs,  lunchmeat, grilled meat or canned tuna), avocado, regular mayo/mustard  and cheese.  A Salad using clue cheese, ranch,  Goddess or vinagrette,  No croutons or "confetti" and no "candied nuts" but regular nuts OK.   No pretzels or chips.  Pickles and miniature sweet peppers are a good low carb alternative  The bread is the only source or carbohydrate that can be decreased (Joseph's makes a pita  bread and a flat bread that are 50 cal and 4 net carbs ; Toufayan makes a low carb flatbread that's 100 cal and 9 net carbs  and  Mission's carb balance whole wheat tortilla  That is 210 cal and 6 net carbs) Avoid "Low fat dressings, as well as Reyne Dumas and [address] dressings They are loaded with sugar!   3 PM/ Mid day  Snack:  Consider  1 ounce of  almonds, walnuts, pistachios, pecans, peanuts,  Macadamia nuts or a nut medley.  Avoid "granola"; the dried cranberries and raisins are loaded with carbohydrates. Mixed nuts ok if no raisins or cranberries or dried fruit.     6 PM  Dinner:    "mean and green, "  Meat/chicken/fish with a green salad, and broccoli, cauliflower, green beans, spinach, brussel sprouts or  Lima beans::       There is a low carb pasta by Dreamfield's available at [employer] that is acceptable and tastes great only 5 diestible carbs/serving.   Try Michel Angelo's chicken piccata or chicken or eggplant parm over low carb pasta.   Clifton Custard Sanchez's "Carnitas" (pulled pork, no sauce,  0 carbs) or his beef pot roast to make a dinner burrito  Whole wheat pasta is still full of digestible carbs and  Not as low in glycemic index as Dreamfield's.   Brown rice is still rice,  So skip the rice and noodles if you eat [place] or [place]  9 PM snack :   Breyer's "low carb" fudgsicle or  ice cream bar (Carb Smart line), or  Weight Watcher's ice cream bar , or another "no sugar added" ice cream;  a serving of fresh berries/cherries with whipped cream   Cheese or yogurt  Avoid bananas, pineapple, grapes  and watermelon on a regular basis because they are high in sugar)   Remember that snack Substitutions should be less than 10 carbs per serving and meals < 20 carbs. Remember to subtract fiber grams to get the "net carbs."

## 2012-06-28 NOTE — Progress Notes (Signed)
Patient ID: Chase Keller, male   DOB: February 01, 1941, 72 y.o.   MRN: [id number]  Patient Active Problem List  Diagnosis  . Hypothyroidism  . Hyperlipidemia  . Obstructive sleep apnea  . Hypertension  . Obesity (BMI 30-39.9)  . Left knee DJD  . Coronary artery disease  . Stented coronary artery  . S/P coronary artery bypass graft x 2  . Bradycardia  . Incomplete right bundle branch block  . Leukocytosis  . CHF (congestive heart failure)  . Cough  . Shortness of breath  . Elevated fasting glucose  . Long term (current) use of anticoagulants  . Atrial fibrillation  . Diabetes mellitus type 2, diet-controlled    Subjective:  CC:   Chief Complaint  Patient presents with  . Follow-up    HPI:   Chase Keller a 72 y.o. male who presents  Past Medical History  Diagnosis Date  . Hypertension   . Hyperlipidemia   . Left knee DJD   . Coronary artery disease   . Stented coronary artery   . S/P coronary artery bypass graft x 2   . PONV (postoperative nausea and vomiting)   . Sleep apnea     CPAP  . Heart murmur     / 1 MD said yes, 1 said no  . Chronic kidney disease     Kidney stone- passed  . Dysrhythmia     PA for Dr Thurston Hole said yes  . History of blood transfusion     during CABG  . MRSA (methicillin resistant staph aureus) culture positive     Drainage from Left Knee approx 2005  . Myocardial infarction     mild in 1998 per Wife   . Shortness of breath   . CHF (congestive heart failure)   . Atrial fibrillation 2013    Past Surgical History  Procedure Laterality Date  . Ptca  2005    [name]  . Bone spurs      Back  . Coronary artery bypass graft  1988    2 vessel,  followed by [facility]  . Cardiac stents  2005  . Nasal sinus surgery      Due to sinus "impaction"  . Total knee arthroplasty  10/13/2011    Procedure: TOTAL KNEE ARTHROPLASTY;  Surgeon: Nilda Simmer, MD;  Location: [facility] OR;  Service: Orthopedics;  Laterality: Left;  left total knee  arthroplasty       The following portions of the patient's history were reviewed and updated as appropriate: Allergies, current medications, and problem list.    Review of Systems:   12 Pt  review of systems was negative except those addressed in the HPI,     History   Social History  . Marital Status: Married    Spouse Name: N/A    Number of Children: 0  . Years of Education: N/A   Occupational History  . Electrician - workes PT     has had previous exposure to metals and also to agent orange   Social History Main Topics  . Smoking status: Never Smoker   . Smokeless tobacco: Never Used     Comment: father smoked in the home while pt was growing up//ex-wife smoked  . Alcohol Use: No  . Drug Use: No  . Sexually Active: Not on file   Other Topics Concern  . Not on file   Social History Narrative  . No narrative on file    Objective:  BP 126/64  Pulse 56  Temp(Src) 98 F (36.7 C) (Oral)  Resp 16  Wt 217 lb 8 oz (98.657 kg)  BMI 34.58 kg/m2  SpO2 99%  General appearance: alert, cooperative and appears stated age Ears: normal TM's and external ear canals both ears Throat: lips, mucosa, and tongue normal; teeth and gums normal Neck: no adenopathy, no carotid bruit, supple, symmetrical, trachea midline and thyroid not enlarged, symmetric, no tenderness/mass/nodules Back: symmetric, no curvature. ROM normal. No CVA tenderness. Lungs: clear to auscultation bilaterally Heart: regular rate and rhythm, S1, S2 normal, no murmur, click, rub or gallop Abdomen: soft, non-tender; bowel sounds normal; no masses,  no organomegaly Pulses: 2+ and symmetric Skin: Skin color, texture, turgor normal. No rashes or lesions Lymph nodes: Cervical, supraclavicular, and axillary nodes normal. Foot exam:  Nails are well trimmed,  No callouses,  Sensation intact to microfilament  Assessment and Plan:  Obstructive sleep apnea He has undergone repeat CPAP titration study and is  wearing his CPAP. Reports improved sleep  Hypertension Well controlled on current regimen. Renal function stable, no changes today.  Long term (current) use of anticoagulants His INR was checked several days ago at the [facility] and was within therapeutic range on 3 mg daily.Marland Kitchen He'll continue to have INR followup there. He is interested in eventually transitioning to the: Coumadin management program it does not represent an additional expense.   Obesity (BMI 30-39.9) I have addressed  BMI and recommended a low glycemic index diet utilizing smaller more frequent meals to increase metabolism.  I have also recommended that patient start exercising with a goal of 30 minutes of aerobic exercise a minimum of 5 days per week. Screening for lipid disorders, thyroid and diabetes to be done today.    Diabetes mellitus type 2, diet-controlled He is due for repeat A1c wants to have this done at the [facility]. Low glycemic index diet discussed with him and wife. Weight loss strongly recommended. Foot exam was normal. He is up-to-date on eye exams.   Updated Medication List Outpatient Encounter Prescriptions as of 06/28/2012  Medication Sig Dispense Refill  . albuterol (PROVENTIL HFA;VENTOLIN HFA) 108 (90 BASE) MCG/ACT inhaler Inhale 2 puffs into the lungs every 6 (six) hours as needed. For shortness of breath or wheezing      . amLODipine (NORVASC) 2.5 MG tablet Take 2.5 mg by mouth as needed. If systolic BP over 161.      . Ascorbic Acid (VITAMIN C) 1000 MG tablet Take 1,000 mg by mouth daily.      . CELEBREX 200 MG capsule Take 200 mg by mouth Daily.      . Cholecalciferol (VITAMIN D3) 5000 UNITS CHEW Chew 5,000 Int'l Units by mouth daily.       . ferrous fumarate-iron polysaccharide complex (TANDEM) 162-115.2 MG CAPS Take 1 capsule by mouth daily with breakfast.  30 capsule  3  . fluticasone (FLONASE) 50 MCG/ACT nasal spray Place 2 sprays into the nose daily.  16 g  2  . furosemide (LASIX) 20 MG  tablet Take 20 mg by mouth daily.      Marland Kitchen Hyaluronic Acid-Vitamin C (HYALURONIC ACID PO) Take 100 mg by mouth 2 (two) times daily.      Marland Kitchen HYDROcodone-acetaminophen (NORCO/VICODIN) 5-325 MG per tablet Take 1 tablet by mouth every 6 (six) hours as needed.      . labetalol (NORMODYNE) 100 MG tablet Take 1 tablet (100 mg total) by mouth 2 (two) times daily.  180 tablet  3  .  labetalol (NORMODYNE) 100 MG tablet TAKE 1 TABLET (100 MG TOTAL) BY MOUTH 2 (TWO) TIMES DAILY.  60 tablet  3  . Melatonin 3 MG TABS Take 1 tablet by mouth at bedtime.      . niacin 500 MG CR capsule Take 500 mg by mouth at bedtime.      . Potassium 99 MG TABS Take 99 mg by mouth 2 (two) times daily.       . traMADol (ULTRAM) 50 MG tablet Take 1 tablet (50 mg total) by mouth every 8 (eight) hours as needed.  90 tablet  3  . UNABLE TO FIND Vit B-1, B-2, B-6, B-12      . valsartan-hydrochlorothiazide (DIOVAN-HCT) 160-25 MG per tablet Take 1 tablet by mouth daily.  90 tablet  3  . warfarin (COUMADIN) 1 MG tablet TAKE 3 TABLETS (3 MG TOTAL) BY MOUTH AS DIRECTED.  90 tablet  3   No facility-administered encounter medications on file as of 06/28/2012.     No orders of the defined types were placed in this encounter.    Return in about 3 months (around 09/28/2012).

## 2012-06-29 ENCOUNTER — Encounter: Payer: Self-pay | Admitting: Internal Medicine

## 2012-06-29 DIAGNOSIS — E119 Type 2 diabetes mellitus without complications: Secondary | ICD-10-CM | POA: Insufficient documentation

## 2012-06-29 NOTE — Assessment & Plan Note (Signed)
I have addressed  BMI and recommended a low glycemic index diet utilizing smaller more frequent meals to increase metabolism.  I have also recommended that patient start exercising with a goal of 30 minutes of aerobic exercise a minimum of 5 days per week. Screening for lipid disorders, thyroid and diabetes to be done today.   

## 2012-06-29 NOTE — Assessment & Plan Note (Addendum)
He is due for repeat A1c wants to have this done at the [facility]. Low glycemic index diet discussed with him and wife. Weight loss strongly recommended. Foot exam was normal. He is up-to-date on eye exams.

## 2012-06-29 NOTE — Assessment & Plan Note (Signed)
He has undergone repeat CPAP titration study and is wearing his CPAP. Reports improved sleep

## 2012-06-29 NOTE — Assessment & Plan Note (Signed)
Well controlled on current regimen. Renal function stable, no changes today. 

## 2012-06-29 NOTE — Assessment & Plan Note (Addendum)
His INR was checked several days ago at the [facility] and was within therapeutic range on 3 mg daily.Marland Kitchen He'll continue to have INR followup there. He is interested in eventually transitioning to the: Coumadin management program it does not represent an additional expense.

## 2012-07-06 ENCOUNTER — Encounter: Payer: Self-pay | Admitting: Pulmonary Disease

## 2012-07-07 ENCOUNTER — Ambulatory Visit: Payer: Self-pay | Admitting: Internal Medicine

## 2012-07-20 ENCOUNTER — Telehealth: Payer: Self-pay | Admitting: Pulmonary Disease

## 2012-07-20 NOTE — Telephone Encounter (Signed)
Spoke with patient, patient states he has had a dry cough, sore throat and diff taking deep breaths x2 weeks. Denies any f/c/s. States he is only taking cough drops and is not getting any better. Patient requesting recs/medication from Dr. Kendrick Fries for symptom relief. Dr. Kendrick Fries please advise, thank you!  Pharmacy: [facility]  Last OV: 02/24/12 with 2 month f/u not scheduled at this time.  Allergies  Allergen Reactions  . Ace Inhibitors Cough  . Crestor (Rosuvastatin)     Muscles aches  . Nuvigil (Armodafinil)     headache  . Oxycodone Other (See Comments)    hallucinations  . Statins

## 2012-07-20 NOTE — Telephone Encounter (Signed)
His cough is usually due to untreated post nasal drip.  Please tell him the following:  Chlortrimeton/phenylephrine combination every four hours as needed for cough Saline rinses Start using flonase again regularly (please call in if he doesn't have it)  If no improvement or if symptoms worsen, then schedule an appointment with [place] or his PCP

## 2012-07-20 NOTE — Telephone Encounter (Signed)
Spoke with patient aware of recs as lsited below per Dr. Kendrick Fries. Patient verbalized understanding Nothing further needed at this time/

## 2012-07-26 IMAGING — CT CT CHEST W/O CM
1 of 2 series · 15 of 29 positions shown, 19 images · non-contrast
Comparison: none

REASON FOR EXAM: cough  eval for possible lung disease
COMMENTS:

PROCEDURE:     CT  - CT CHEST WITHOUT CONTRAST  - April 24, 2011  [DATE]
RESULT:     Comparison: None
TECHNIQUE: Multiple axial images of the chest were obtained without
intravenous contrast.

[Series 2: soft tissue · axial · 0.79mm/px · z∈[+622,+902]mm · 15 of 62 slices shown, 19 images]
[im 3/62  mediastinal]
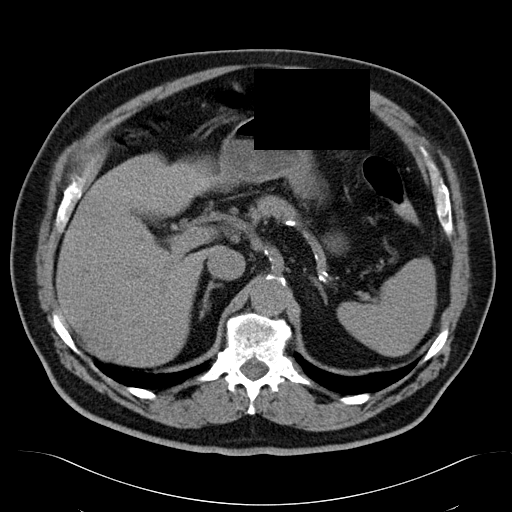
[im 3/62  lung]
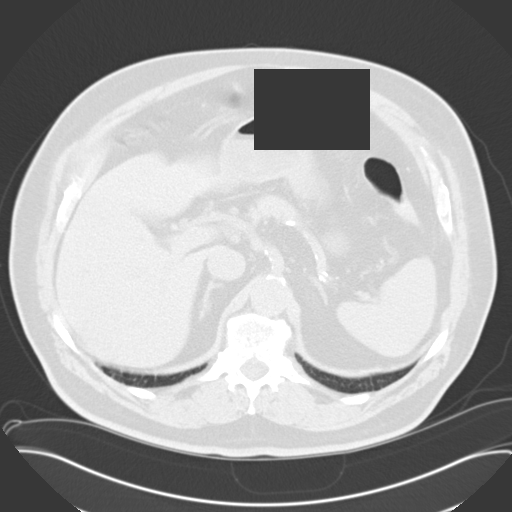
[im 8/62  lung]
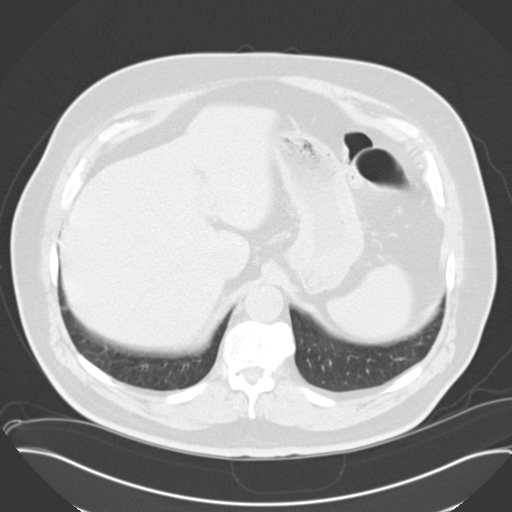
[im 11/62  lung]
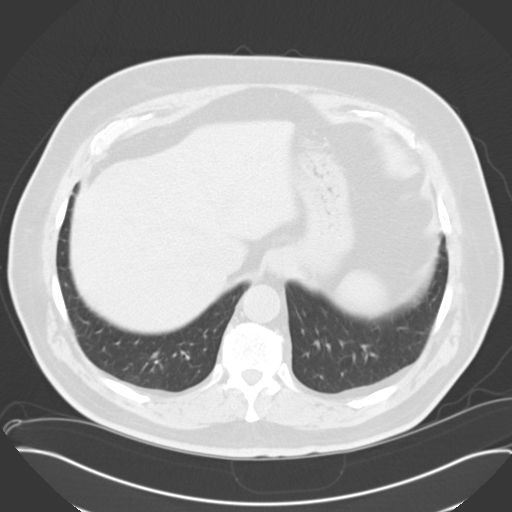
[im 16/62  lung]
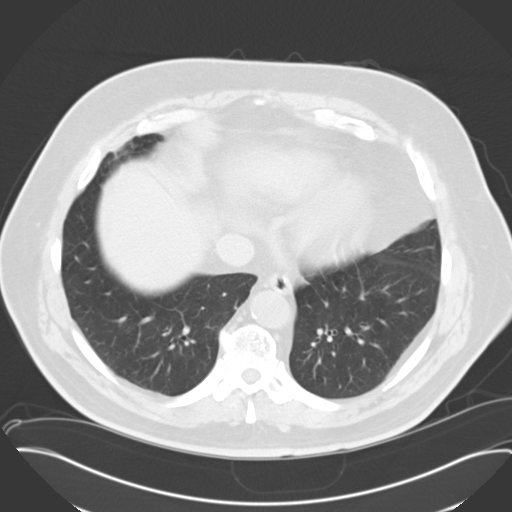
[im 19/62  mediastinal]
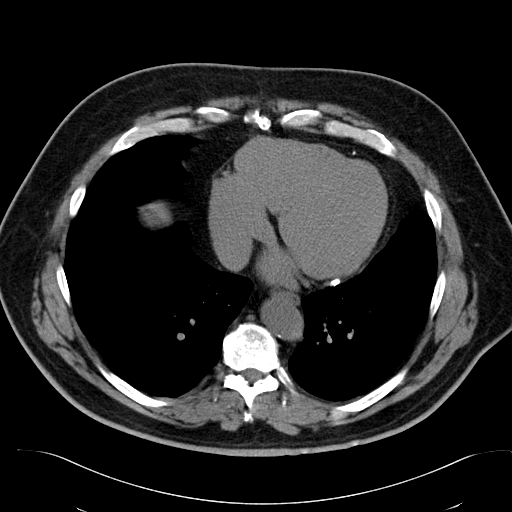
[im 19/62  lung]
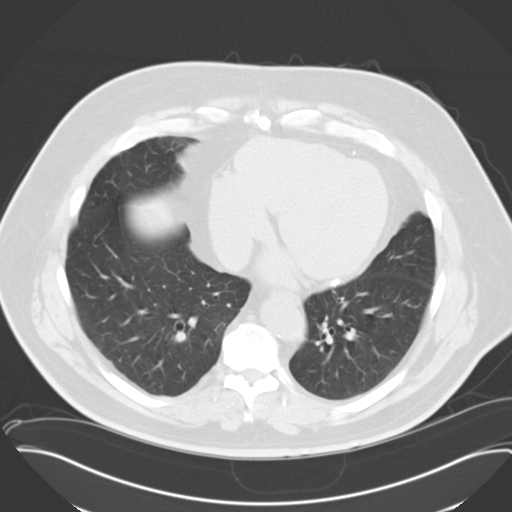
[im 24/62  lung]
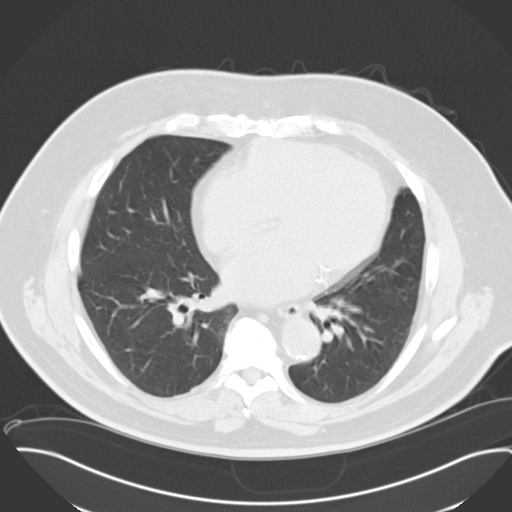
[im 27/62  lung]
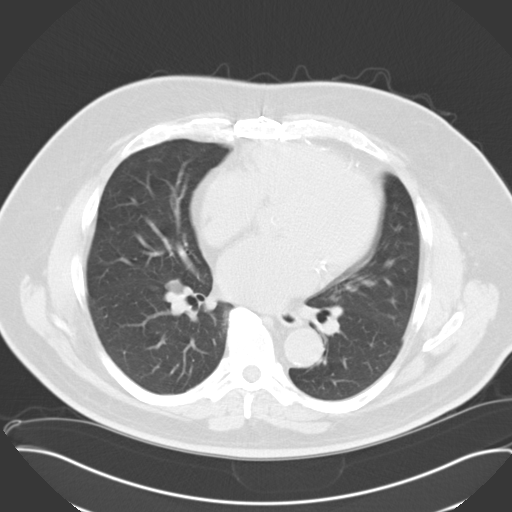
[im 31/62  lung]
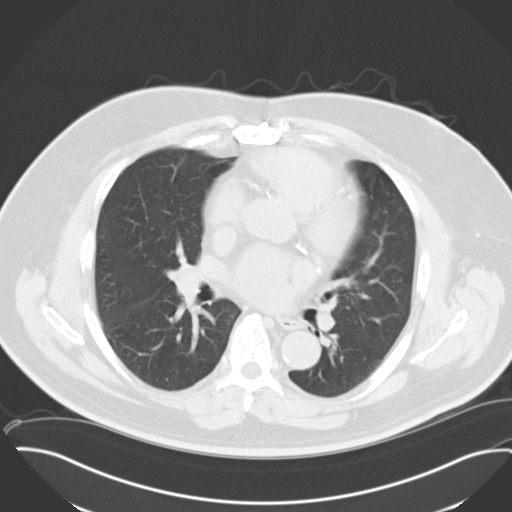
[im 35/62  mediastinal]
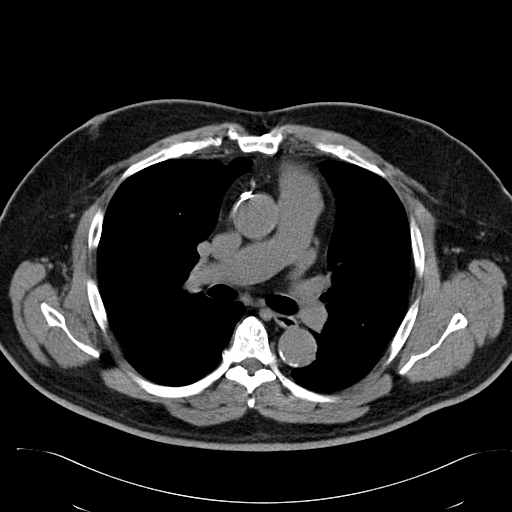
[im 35/62  lung]
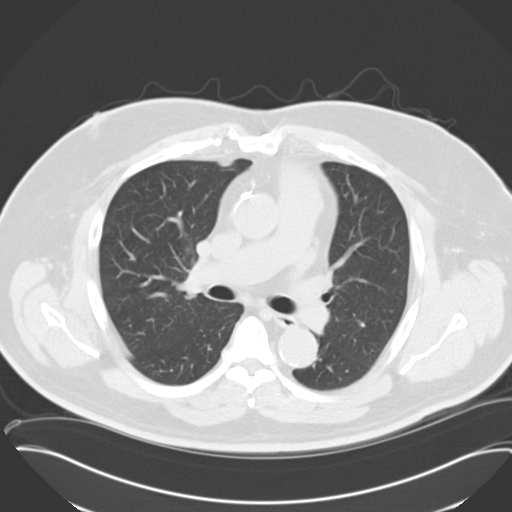
[im 38/62  lung]
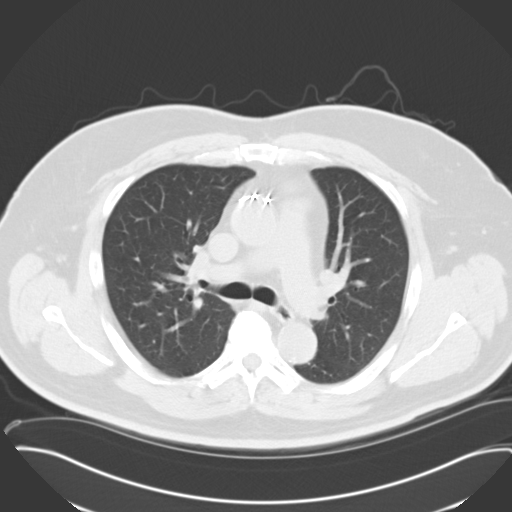
[im 43/62  lung]
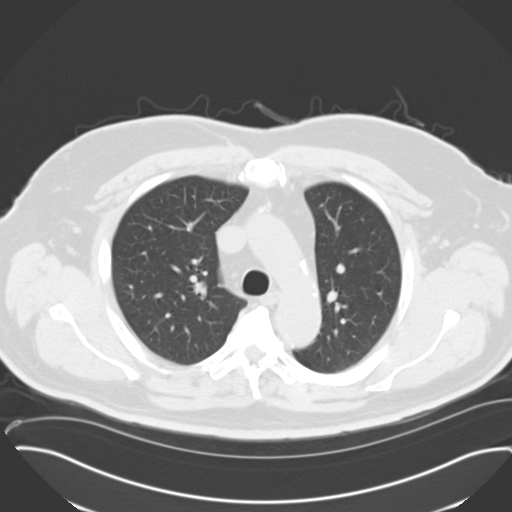
[im 46/62  lung]
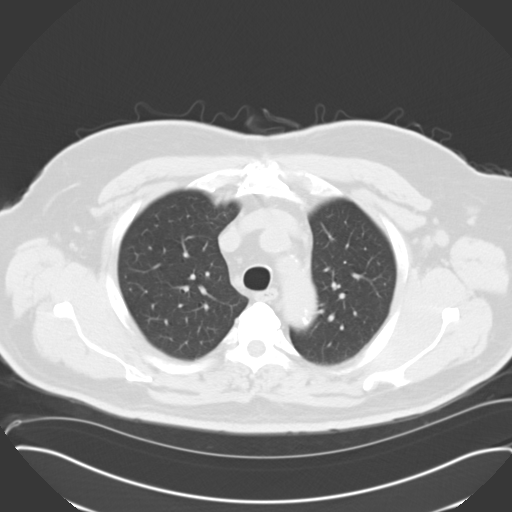
[im 51/62  mediastinal]
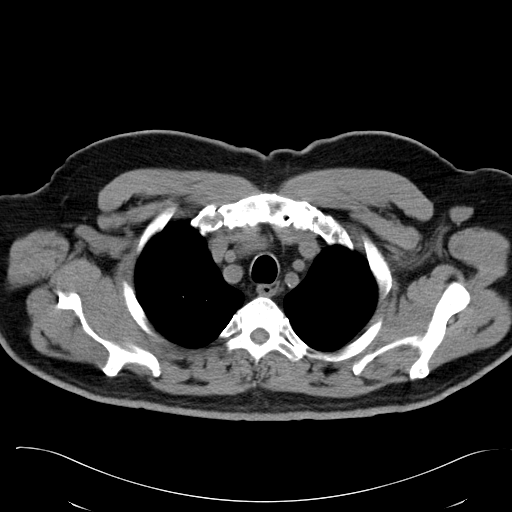
[im 51/62  lung]
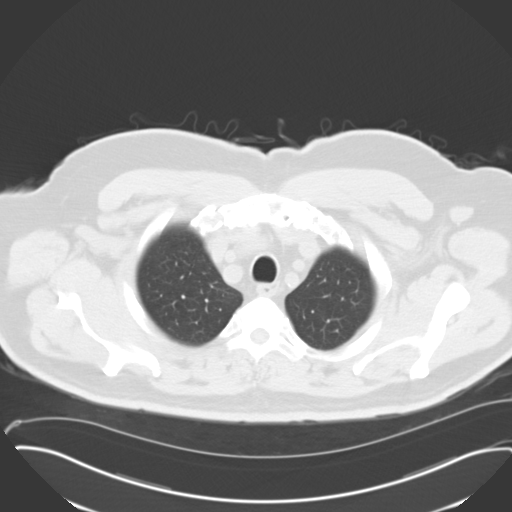
[im 54/62  lung]
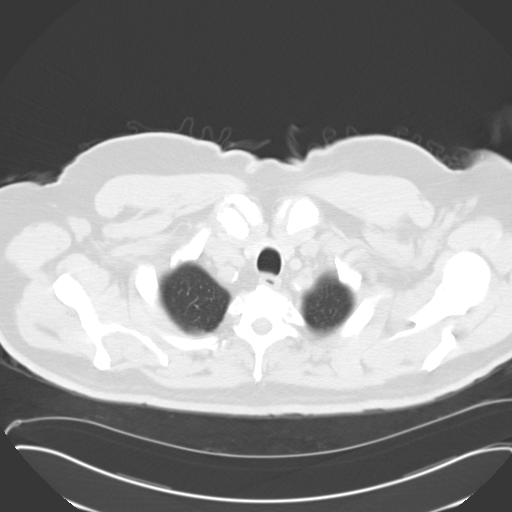
[im 59/62  lung]
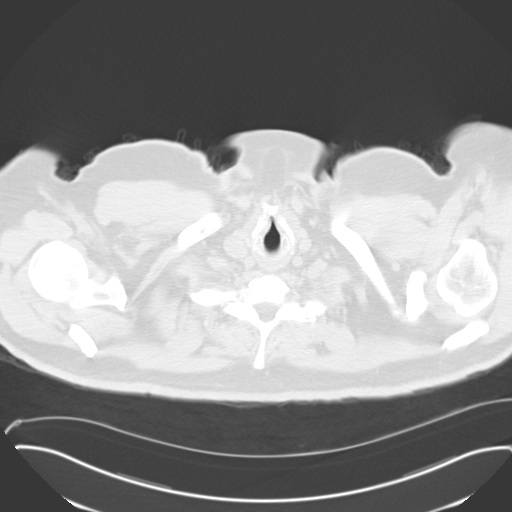

[15 of 29 positions shown; findings below may reference images not displayed]

FINDINGS: No mediastinal, hilar, or axillary lymphadenopathy. There are calcifications
in the coronary arteries. Prior CABG. A calcified stone is seen in the
gallbladder. Tiny low-attenuation lesion in the right hepatic lobe is too
small to characterize. There is a 4 mm nodule along the right minor fissure,
image 31. There is a 4 mm nodule in the posterior right lower lobe, which
may be partially calcified, as seen on image 42. A 3 mm nodule is seen in
the inferior left lower lobe on image 50. The central airways are patent.
IMPRESSION: 1. No acute findings in the chest on this noncontrast study.
2. Multiple indeterminate pulmonary nodules, which are 4 mm less. If the
patient is at low risk for lung cancer, no further follow-up is recommended.
 If the patient is at high risk for lung cancer, recommend 12 month
follow-up noncontrast chest CT.
3. Cholelithiasis.

## 2012-09-28 ENCOUNTER — Ambulatory Visit: Payer: Medicare PPO | Admitting: Internal Medicine

## 2012-11-03 ENCOUNTER — Ambulatory Visit: Payer: Self-pay | Admitting: Orthopedic Surgery

## 2013-01-18 NOTE — Progress Notes (Signed)
This encounter was created in error - please disregard.

## 2016-12-31 NOTE — Telephone Encounter (Signed)
Error

## 2018-10-27 ENCOUNTER — Other Ambulatory Visit: Payer: Self-pay

## 2018-10-27 DIAGNOSIS — Z20822 Contact with and (suspected) exposure to covid-19: Secondary | ICD-10-CM

## 2018-10-30 LAB — NOVEL CORONAVIRUS, NAA: SARS-CoV-2, NAA: NOT DETECTED

## 2018-11-01 ENCOUNTER — Ambulatory Visit: Payer: Medicare Other | Admitting: Podiatry

## 2018-11-08 ENCOUNTER — Encounter: Payer: Self-pay | Admitting: Podiatry

## 2018-11-08 ENCOUNTER — Ambulatory Visit: Payer: Medicare Other | Admitting: Podiatry

## 2018-11-08 ENCOUNTER — Ambulatory Visit (INDEPENDENT_AMBULATORY_CARE_PROVIDER_SITE_OTHER): Payer: Medicare Other | Admitting: Podiatry

## 2018-11-08 ENCOUNTER — Other Ambulatory Visit: Payer: Self-pay

## 2018-11-08 DIAGNOSIS — E119 Type 2 diabetes mellitus without complications: Secondary | ICD-10-CM | POA: Diagnosis not present

## 2018-11-08 DIAGNOSIS — M79674 Pain in right toe(s): Secondary | ICD-10-CM

## 2018-11-08 DIAGNOSIS — L608 Other nail disorders: Secondary | ICD-10-CM

## 2018-11-08 DIAGNOSIS — B351 Tinea unguium: Secondary | ICD-10-CM

## 2018-11-08 DIAGNOSIS — D689 Coagulation defect, unspecified: Secondary | ICD-10-CM

## 2018-11-08 DIAGNOSIS — M79675 Pain in left toe(s): Secondary | ICD-10-CM | POA: Diagnosis not present

## 2018-11-08 NOTE — Progress Notes (Signed)
This patient presents to the office with chief complaint of long thick nails and diabetic feet.  This patient  says there  is  no pain and discomfort in their feet.  This patient says there are long thick painful nails.  He says the big toenails on both feet are especially ingrown.  These nails are painful walking and wearing shoes.  Patient has no history of infection or drainage from both feet.  Patient is unable to  self treat his own nails . This patient presents  to the office today for treatment of the  long nails and a foot evaluation due to history of  Diabetes.  Patient is taking coumadin.  General Appearance  Alert, conversant and in no acute stress.  Vascular  Dorsalis pedis and posterior tibial  pulses are palpable  bilaterally.  Capillary return is within normal limits  bilaterally. Temperature is within normal limits  bilaterally.  Neurologic  Senn-Weinstein monofilament wire test within normal limits left foot.  Dorsalis pedis and posterior tibial pulses are weakly palpable right foot.. Muscle power within normal limits bilaterally.  Nails Thick disfigured discolored nails with subungual debris  from hallux to fifth toes bilaterally. No evidence of bacterial infection or drainage bilaterally.  Pincer nails  Hallux  B/l.  Orthopedic  No limitations of motion of motion feet .  No crepitus or effusions noted.  No bony pathology or digital deformities noted.  Skin  normotropic skin with no porokeratosis noted bilaterally.  No signs of infections or ulcers noted.     Onychomycosis  Diabetes with no foot complications  IE  Debride nails x 10.  A diabetic foot exam was performed and there is no evidence of any vascular or neurologic pathology.   RTC 3 months.   Gardiner Barefoot DPM

## 2019-01-20 ENCOUNTER — Other Ambulatory Visit: Payer: Self-pay

## 2019-01-20 DIAGNOSIS — Z20822 Contact with and (suspected) exposure to covid-19: Secondary | ICD-10-CM

## 2019-01-22 ENCOUNTER — Telehealth: Payer: Self-pay

## 2019-01-22 LAB — NOVEL CORONAVIRUS, NAA: SARS-CoV-2, NAA: NOT DETECTED

## 2019-01-22 NOTE — Telephone Encounter (Signed)
Pt  called for covid results -advised results are not back.  

## 2019-01-24 ENCOUNTER — Encounter: Payer: Self-pay | Admitting: Podiatry

## 2019-01-24 ENCOUNTER — Other Ambulatory Visit: Payer: Self-pay

## 2019-01-24 ENCOUNTER — Telehealth: Payer: Self-pay | Admitting: Internal Medicine

## 2019-01-24 ENCOUNTER — Ambulatory Visit: Payer: Medicare Other | Admitting: Podiatry

## 2019-01-24 DIAGNOSIS — M79674 Pain in right toe(s): Secondary | ICD-10-CM

## 2019-01-24 DIAGNOSIS — D689 Coagulation defect, unspecified: Secondary | ICD-10-CM | POA: Diagnosis not present

## 2019-01-24 DIAGNOSIS — E119 Type 2 diabetes mellitus without complications: Secondary | ICD-10-CM

## 2019-01-24 DIAGNOSIS — L608 Other nail disorders: Secondary | ICD-10-CM

## 2019-01-24 DIAGNOSIS — B351 Tinea unguium: Secondary | ICD-10-CM | POA: Diagnosis not present

## 2019-01-24 DIAGNOSIS — M79675 Pain in left toe(s): Secondary | ICD-10-CM

## 2019-01-24 NOTE — Telephone Encounter (Signed)
Patient is calling for his negative COVID test results. Patient expressed understanding.

## 2019-01-24 NOTE — Progress Notes (Signed)
This patient presents to the office with chief complaint of long thick nails and diabetic feet.  This patient  says there  is  no pain and discomfort in their feet.  This patient says there are long thick painful nails.  He says the big toenails on both feet are especially ingrown.  These nails are painful walking and wearing shoes.  Patient has no history of infection or drainage from both feet.  Patient is unable to  self treat his own nails . This patient presents  to the office today for treatment of the  long nails and a foot evaluation due to history of  Diabetes.  Patient is taking coumadin.  General Appearance  Alert, conversant and in no acute stress.  Vascular  Dorsalis pedis and posterior tibial  pulses are palpable  bilaterally.  Capillary return is within normal limits  bilaterally. Temperature is within normal limits  bilaterally.  Neurologic  Senn-Weinstein monofilament wire test within normal limits left foot.  Dorsalis pedis and posterior tibial pulses are weakly palpable right foot.. Muscle power within normal limits bilaterally.  Nails Thick disfigured discolored nails with subungual debris  from hallux to fifth toes bilaterally. No evidence of bacterial infection or drainage bilaterally.  Pincer nails  Hallux  B/l.  Orthopedic  No limitations of motion of motion feet .  No crepitus or effusions noted.  No bony pathology or digital deformities noted.  Skin  normotropic skin with no porokeratosis noted bilaterally.  No signs of infections or ulcers noted.     Onychomycosis  Diabetes with no foot complications  IE  Debride nails x 10.  A diabetic foot exam was performed and there is no evidence of any vascular or neurologic pathology.   RTC 3 months.   Aishia Barkey DPM  

## 2019-04-25 ENCOUNTER — Ambulatory Visit: Payer: Medicare PPO | Admitting: Podiatry

## 2019-04-25 ENCOUNTER — Encounter: Payer: Self-pay | Admitting: Podiatry

## 2019-04-25 ENCOUNTER — Other Ambulatory Visit: Payer: Self-pay

## 2019-04-25 DIAGNOSIS — E119 Type 2 diabetes mellitus without complications: Secondary | ICD-10-CM

## 2019-04-25 DIAGNOSIS — D689 Coagulation defect, unspecified: Secondary | ICD-10-CM | POA: Diagnosis not present

## 2019-04-25 DIAGNOSIS — L608 Other nail disorders: Secondary | ICD-10-CM

## 2019-04-25 DIAGNOSIS — M79674 Pain in right toe(s): Secondary | ICD-10-CM

## 2019-04-25 DIAGNOSIS — B351 Tinea unguium: Secondary | ICD-10-CM

## 2019-04-25 DIAGNOSIS — M79675 Pain in left toe(s): Secondary | ICD-10-CM

## 2019-04-25 NOTE — Progress Notes (Signed)
Complaint:  Visit Type: Patient returns to my office for continued preventative foot care services. Complaint: Patient states" my nails have grown long and thick and become painful to walk and wear shoes" Patient has been diagnosed with DM with no foot complications. The patient presents for preventative foot care services.  Podiatric Exam: Vascular: dorsalis pedis and posterior tibial pulses are palpable bilateral. Capillary return is immediate. Temperature gradient is WNL. Skin turgor WNL  Sensorium: Normal Semmes Weinstein monofilament test. Normal tactile sensation bilaterally. Nail Exam: Pt has thick disfigured discolored nails with subungual debris noted bilateral entire nail hallux through fifth toenails Ulcer Exam: There is no evidence of ulcer or pre-ulcerative changes or infection. Orthopedic Exam: Muscle tone and strength are WNL. No limitations in general ROM. No crepitus or effusions noted. Foot type and digits show no abnormalities. Bony prominences are unremarkable. Skin: No Porokeratosis. No infection or ulcers  Diagnosis:  Onychomycosis, , Pain in right toe, pain in left toes  Treatment & Plan Procedures and Treatment: Consent by patient was obtained for treatment procedures.   Debridement of mycotic and hypertrophic toenails, 1 through 5 bilateral and clearing of subungual debris. No ulceration, no infection noted.  Return Visit-Office Procedure: Patient instructed to return to the office for a follow up visit 3 months for continued evaluation and treatment.    Lacrisha Bielicki DPM 

## 2019-07-25 ENCOUNTER — Ambulatory Visit: Payer: Medicare PPO | Admitting: Podiatry

## 2019-07-28 ENCOUNTER — Ambulatory Visit: Payer: Medicare PPO | Admitting: Podiatry

## 2019-07-28 ENCOUNTER — Encounter: Payer: Self-pay | Admitting: Podiatry

## 2019-07-28 ENCOUNTER — Other Ambulatory Visit: Payer: Self-pay

## 2019-07-28 VITALS — Temp 97.6°F

## 2019-07-28 DIAGNOSIS — L608 Other nail disorders: Secondary | ICD-10-CM

## 2019-07-28 DIAGNOSIS — M79675 Pain in left toe(s): Secondary | ICD-10-CM

## 2019-07-28 DIAGNOSIS — B351 Tinea unguium: Secondary | ICD-10-CM

## 2019-07-28 DIAGNOSIS — D689 Coagulation defect, unspecified: Secondary | ICD-10-CM | POA: Diagnosis not present

## 2019-07-28 DIAGNOSIS — E119 Type 2 diabetes mellitus without complications: Secondary | ICD-10-CM

## 2019-07-28 DIAGNOSIS — M79674 Pain in right toe(s): Secondary | ICD-10-CM

## 2019-07-28 NOTE — Progress Notes (Signed)
This patient returns to my office for at risk foot care.  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 This patient is unable to cut nails himself since the patient cannot reach his nails.These nails are painful walking and wearing shoes.  This patient presents for at risk foot care today.  General Appearance  Alert, conversant and in no acute stress.  Vascular  Dorsalis pedis and posterior tibial  pulses are palpable  bilaterally.  Capillary return is within normal limits  bilaterally. Temperature is within normal limits  bilaterally.  Neurologic  Senn-Weinstein monofilament wire test within normal limits  bilaterally. Muscle power within normal limits bilaterally.  Nails Thick disfigured discolored nails with subungual debris  from hallux to fifth toes bilaterally.  Pincer nails  B/l.   No evidence of bacterial infection or drainage bilaterally.  Orthopedic  No limitations of motion  feet .  No crepitus or effusions noted.  No bony pathology or digital deformities noted. Plantar flexed fifth metatarsal  B/L  Skin  normotropic skin with no porokeratosis noted bilaterally.  No signs of infections or ulcers noted.     Onychomycosis  Pain in right toes  Pain in left toes  Consent was obtained for treatment procedures.   Mechanical debridement of nails 1-5  bilaterally performed with a nail nipper.  Filed with dremel without incident.    Return office visit   3 months                   Told patient to return for periodic foot care and evaluation due to potential at risk complications.   Gardiner Barefoot DPM

## 2019-10-24 ENCOUNTER — Other Ambulatory Visit: Payer: Self-pay

## 2019-10-24 ENCOUNTER — Encounter: Payer: No Typology Code available for payment source | Attending: Cardiovascular Disease | Admitting: *Deleted

## 2019-10-24 DIAGNOSIS — I21A1 Myocardial infarction type 2: Secondary | ICD-10-CM | POA: Insufficient documentation

## 2019-10-24 NOTE — Progress Notes (Signed)
Initial orientation completed. Diagnosis can be found in media tab. EP orienation scheduled for Thursday 7/22 at 2:30.

## 2019-10-27 ENCOUNTER — Other Ambulatory Visit: Payer: Self-pay

## 2019-10-27 VITALS — Ht 66.0 in | Wt 215.3 lb

## 2019-10-27 DIAGNOSIS — I21A1 Myocardial infarction type 2: Secondary | ICD-10-CM | POA: Diagnosis present

## 2019-10-27 NOTE — Patient Instructions (Signed)
Patient Instructions  Patient Details  Name: Chase Keller MRN: [id number] Date of Birth: 14-Apr-1940 Referring Provider:  Marylene Land, NP  Below are your personal goals for exercise, nutrition, and risk factors. Our goal is to help you stay on track towards obtaining and maintaining these goals. We will be discussing your progress on these goals with you throughout the program.  Initial Exercise Prescription:  Initial Exercise Prescription - 10/27/19 1600      Date of Initial Exercise RX and Referring Provider   Date 10/27/19    Referring Provider Marylene Land, NP ([place])      Treadmill   MPH 1.4    Grade 0    Minutes 15    METs 2.07      Recumbant Bike   Level 2    RPM 60    Watts 10    Minutes 15    METs 1.6      NuStep   Level 2    SPM 80    Minutes 15    METs 1.6      REL-XR   Level 2    Speed 50    Minutes 15    METs 1.6      Biostep-RELP   Level 2    SPM 50    Minutes 15    METs 1.6      Prescription Details   Frequency (times per week) 3    Duration Progress to 30 minutes of continuous aerobic without signs/symptoms of physical distress      Intensity   THRR 40-80% of Max Heartrate 91-125    Ratings of Perceived Exertion 11-13    Perceived Dyspnea 0-4      Progression   Progression Continue to progress workloads to maintain intensity without signs/symptoms of physical distress.      Resistance Training   Training Prescription Yes    Weight 3 lb    Reps 10-15           Exercise Goals: Frequency: Be able to perform aerobic exercise two to three times per week in program working toward 2-5 days per week of home exercise.  Intensity: Work with a perceived exertion of 11 (fairly light) - 15 (hard) while following your exercise prescription.  We will make changes to your prescription with you as you progress through the program.   Duration: Be able to do 30 to 45 minutes of continuous aerobic exercise in addition to a 5 minute warm-up and  a 5 minute cool-down routine.   Nutrition Goals: Your personal nutrition goals will be established when you do your nutrition analysis with the dietician.  The following are general nutrition guidelines to follow: Cholesterol < 200mg /day Sodium < 1500mg /day Fiber: Men over 50 yrs - 30 grams per day  Personal Goals:  Personal Goals and Risk Factors at Admission - 10/27/19 1644      Core Components/Risk Factors/Patient Goals on Admission    Weight Management Yes;Weight Loss    Intervention Weight Management: Develop a combined nutrition and exercise program designed to reach desired caloric intake, while maintaining appropriate intake of nutrient and fiber, sodium and fats, and appropriate energy expenditure required for the weight goal.;Weight Management: Provide education and appropriate resources to help participant work on and attain dietary goals.;Weight Management/Obesity: Establish reasonable short term and long term weight goals.    Admit Weight 215 lb 4.8 oz (97.7 kg)    Goal Weight: Short Term 210 lb (95.3 kg)    Goal  Weight: Long Term 205 lb (93 kg)    Expected Outcomes Short Term: Continue to assess and modify interventions until short term weight is achieved;Long Term: Adherence to nutrition and physical activity/exercise program aimed toward attainment of established weight goal;Weight Loss: Understanding of general recommendations for a balanced deficit meal plan, which promotes 1-2 lb weight loss per week and includes a negative energy balance of [phone] kcal/d;Understanding recommendations for meals to include 15-35% energy as protein, 25-35% energy from fat, 35-60% energy from carbohydrates, less than 200mg  of dietary cholesterol, 20-35 gm of total fiber daily;Understanding of distribution of calorie intake throughout the day with the consumption of 4-5 meals/snacks    Diabetes Yes    Intervention Provide education about signs/symptoms and action to take for  hypo/hyperglycemia.;Provide education about proper nutrition, including hydration, and aerobic/resistive exercise prescription along with prescribed medications to achieve blood glucose in normal ranges: Fasting glucose 65-99 mg/dL    Expected Outcomes Short Term: Participant verbalizes understanding of the signs/symptoms and immediate care of hyper/hypoglycemia, proper foot care and importance of medication, aerobic/resistive exercise and nutrition plan for blood glucose control.;Long Term: Attainment of HbA1C < 7%.    Hypertension Yes    Intervention Provide education on lifestyle modifcations including regular physical activity/exercise, weight management, moderate sodium restriction and increased consumption of fresh fruit, vegetables, and low fat dairy, alcohol moderation, and smoking cessation.;Monitor prescription use compliance.    Expected Outcomes Short Term: Continued assessment and intervention until BP is < 140/21mm HG in hypertensive participants. < 130/75mm HG in hypertensive participants with diabetes, heart failure or chronic kidney disease.;Long Term: Maintenance of blood pressure at goal levels.    Lipids Yes    Intervention Provide education and support for participant on nutrition & aerobic/resistive exercise along with prescribed medications to achieve LDL 70mg , HDL >40mg .    Expected Outcomes Short Term: Participant states understanding of desired cholesterol values and is compliant with medications prescribed. Participant is following exercise prescription and nutrition guidelines.;Long Term: Cholesterol controlled with medications as prescribed, with individualized exercise RX and with personalized nutrition plan. Value goals: LDL < 70mg , HDL > 40 mg.           Tobacco Use Initial Evaluation: Social History   Tobacco Use  Smoking Status Never Smoker  Smokeless Tobacco Never Used  Tobacco Comment   father smoked in the home while pt was growing up//ex-wife smoked     Exercise Goals and Review:  Exercise Goals    Row Name 10/27/19 1642             Exercise Goals   Increase Physical Activity Yes       Intervention Provide advice, education, support and counseling about physical activity/exercise needs.;Develop an individualized exercise prescription for aerobic and resistive training based on initial evaluation findings, risk stratification, comorbidities and participant's personal goals.       Expected Outcomes Short Term: Attend rehab on a regular basis to increase amount of physical activity.;Long Term: Add in home exercise to make exercise part of routine and to increase amount of physical activity.;Long Term: Exercising regularly at least 3-5 days a week.       Increase Strength and Stamina Yes       Intervention Provide advice, education, support and counseling about physical activity/exercise needs.;Develop an individualized exercise prescription for aerobic and resistive training based on initial evaluation findings, risk stratification, comorbidities and participant's personal goals.       Expected Outcomes Short Term: Increase workloads from initial exercise prescription  for resistance, speed, and METs.;Short Term: Perform resistance training exercises routinely during rehab and add in resistance training at home;Long Term: Improve cardiorespiratory fitness, muscular endurance and strength as measured by increased METs and functional capacity (6MWT)       Able to understand and use rate of perceived exertion (RPE) scale Yes       Intervention Provide education and explanation on how to use RPE scale       Expected Outcomes Short Term: Able to use RPE daily in rehab to express subjective intensity level;Long Term:  Able to use RPE to guide intensity level when exercising independently       Able to understand and use Dyspnea scale Yes       Intervention Provide education and explanation on how to use Dyspnea scale       Expected Outcomes Short  Term: Able to use Dyspnea scale daily in rehab to express subjective sense of shortness of breath during exertion;Long Term: Able to use Dyspnea scale to guide intensity level when exercising independently       Knowledge and understanding of Target Heart Rate Range (THRR) Yes       Intervention Provide education and explanation of THRR including how the numbers were predicted and where they are located for reference       Expected Outcomes Short Term: Able to state/look up THRR;Short Term: Able to use daily as guideline for intensity in rehab;Long Term: Able to use THRR to govern intensity when exercising independently       Able to check pulse independently Yes       Intervention Provide education and demonstration on how to check pulse in carotid and radial arteries.;Review the importance of being able to check your own pulse for safety during independent exercise       Expected Outcomes Short Term: Able to explain why pulse checking is important during independent exercise;Long Term: Able to check pulse independently and accurately       Understanding of Exercise Prescription Yes       Intervention Provide education, explanation, and written materials on patient's individual exercise prescription       Expected Outcomes Short Term: Able to explain program exercise prescription;Long Term: Able to explain home exercise prescription to exercise independently              Copy of goals given to participant.

## 2019-10-27 NOTE — Progress Notes (Signed)
Cardiac Individual Treatment Plan  Patient Details  Name: Chase Keller MRN: [id number] Date of Birth: 14-Jun-1940 Referring Provider:     Cardiac Rehab from 10/27/2019 in [facility] Cardiac and Pulmonary Rehab  Referring Provider Marylene Land, NP ([place])      Initial Encounter Date:    Cardiac Rehab from 10/27/2019 in [facility] Cardiac and Pulmonary Rehab  Date 10/27/19      Visit Diagnosis: Type 2 myocardial infarction [facility])  Patient's Home Medications on Admission:  Current Outpatient Medications:  .  albuterol (VENTOLIN HFA) 108 (90 Base) MCG/ACT inhaler, Inhale into the lungs., Disp: , Rfl:  .  allopurinol (ZYLOPRIM) 300 MG tablet, Take 300 mg by mouth daily. (Patient not taking: Reported on 10/24/2019), Disp: , Rfl:  .  amLODipine (NORVASC) 2.5 MG tablet, Take 2.5 mg by mouth as needed. If systolic BP over 737., Disp: , Rfl:  .  ammonium lactate (LAC-HYDRIN) 12 % lotion, Apply 1 application topically as needed for dry skin., Disp: , Rfl:  .  Ascorbic Acid (VITAMIN C) 1000 MG tablet, Take 1,000 mg by mouth daily. (Patient not taking: Reported on 10/24/2019), Disp: , Rfl:  .  aspirin EC 81 MG tablet, Take 81 mg by mouth daily. (Patient not taking: Reported on 10/24/2019), Disp: , Rfl:  .  Calcium Carb-Cholecalciferol (CALCIUM/VITAMIN D) 600-400 MG-UNIT TABS, Take by mouth., Disp: , Rfl:  .  carvedilol (COREG) 6.25 MG tablet, Take by mouth. (Patient not taking: Reported on 10/24/2019), Disp: , Rfl:  .  CELEBREX 200 MG capsule, Take 200 mg by mouth Daily. (Patient not taking: Reported on 10/24/2019), Disp: , Rfl:  .  Cholecalciferol (VITAMIN D3) 125 MCG (5000 UT) TABS, Take by mouth. (Patient not taking: Reported on 10/24/2019), Disp: , Rfl:  .  cyanocobalamin 1000 MCG tablet, Take 1,000 mcg by mouth daily. (Patient not taking: Reported on 10/24/2019), Disp: , Rfl:  .  cyclobenzaprine (FLEXERIL) 10 MG tablet, Take 10 mg by mouth 3 (three) times daily as needed for muscle spasms. (Patient not taking:  Reported on 10/24/2019), Disp: , Rfl:  .  docusate sodium (COLACE) 50 MG capsule, Take 50 mg by mouth 2 (two) times daily. (Patient not taking: Reported on 10/24/2019), Disp: , Rfl:  .  ezetimibe (ZETIA) 10 MG tablet, Take 10 mg by mouth daily., Disp: , Rfl:  .  ferrous fumarate-iron polysaccharide complex (TANDEM) 162-115.2 MG CAPS capsule, Take by mouth., Disp: , Rfl:  .  fluticasone (FLONASE) 50 MCG/ACT nasal spray, Place into the nose., Disp: , Rfl:  .  furosemide (LASIX) 20 MG tablet, Take by mouth. (Patient not taking: Reported on 10/24/2019), Disp: , Rfl:  .  glipiZIDE (GLUCOTROL) 5 MG tablet, Take 2.5 mg by mouth daily before breakfast. , Disp: , Rfl:  .  Hyaluronic Acid-Vitamin C (HYALURONIC ACID PO), Take 100 mg by mouth 2 (two) times daily. (Patient not taking: Reported on 10/24/2019), Disp: , Rfl:  .  HYDROcodone-acetaminophen (NORCO/VICODIN) 5-325 MG per tablet, Take 1 tablet by mouth every 6 (six) hours as needed. (Patient not taking: Reported on 10/24/2019), Disp: , Rfl:  .  labetalol (NORMODYNE) 100 MG tablet, Take 1 tablet (100 mg total) by mouth 2 (two) times daily. (Patient not taking: Reported on 10/24/2019), Disp: 180 tablet, Rfl: 3 .  labetalol (NORMODYNE) 100 MG tablet, TAKE 1 TABLET (100 MG TOTAL) BY MOUTH 2 (TWO) TIMES DAILY. (Patient not taking: Reported on 10/24/2019), Disp: 60 tablet, Rfl: 3 .  Melatonin 1 MG TABS, Take by mouth., Disp: ,  Rfl:  .  niacin 500 MG CR capsule, Take 500 mg by mouth at bedtime. (Patient not taking: Reported on 10/24/2019), Disp: , Rfl:  .  Potassium 99 MG TABS, Take 99 mg by mouth 2 (two) times daily.  (Patient not taking: Reported on 10/24/2019), Disp: , Rfl:  .  potassium gluconate 595 (99 K) MG TABS tablet, Take by mouth. (Patient not taking: Reported on 10/24/2019), Disp: , Rfl:  .  pyridOXINE (B-6) 50 MG tablet, Take 50 mg by mouth daily. (Patient not taking: Reported on 10/24/2019), Disp: , Rfl:  .  senna (SENOKOT) 8.6 MG tablet, Take 1 tablet by  mouth daily. (Patient not taking: Reported on 10/24/2019), Disp: , Rfl:  .  Skin Protectants, Misc. (HYDROCERIN EX), Apply topically., Disp: , Rfl:  .  spironolactone (ALDACTONE) 25 MG tablet, Take 25 mg by mouth daily., Disp: , Rfl:  .  tamsulosin (FLOMAX) 0.4 MG CAPS capsule, Take 0.4 mg by mouth at bedtime., Disp: , Rfl:  .  thiamine 100 MG tablet, Take 100 mg by mouth daily. (Patient not taking: Reported on 10/24/2019), Disp: , Rfl:  .  traMADol (ULTRAM) 50 MG tablet, Take by mouth. (Patient not taking: Reported on 10/24/2019), Disp: , Rfl:  .  trospium (SANCTURA) 20 MG tablet, Take 20 mg by mouth 2 (two) times daily., Disp: , Rfl:  .  UNABLE TO FIND, Vit B-1, B-2, B-6, B-12 (Patient not taking: Reported on 10/24/2019), Disp: , Rfl:  .  valsartan-hydrochlorothiazide (DIOVAN-HCT) 160-25 MG tablet, Take by mouth. (Patient not taking: Reported on 10/24/2019), Disp: , Rfl:  .  vitamin E 400 UNIT capsule, Take 400 Units by mouth daily. (Patient not taking: Reported on 10/24/2019), Disp: , Rfl:  .  warfarin (COUMADIN) 1 MG tablet, TAKE 3 TABLETS (3 MG TOTAL) BY MOUTH AS DIRECTED., Disp: , Rfl:   Past Medical History: Past Medical History:  Diagnosis Date  . Atrial fibrillation ([place]) 2013  . CHF (congestive heart failure) ([place])   . Chronic kidney disease    Kidney stone- passed  . Coronary artery disease   . Dysrhythmia    PA for Dr Noemi Chapel said yes  . Heart murmur    / 1 MD said yes, 1 said no  . History of blood transfusion    during CABG  . Hyperlipidemia   . Hypertension   . Left knee DJD   . MRSA (methicillin resistant staph aureus) culture positive    Drainage from Left Knee approx 2005  . Myocardial infarction ([place])    mild in 1998 per Wife   . PONV (postoperative nausea and vomiting)   . S/P coronary artery bypass graft x 2   . Shortness of breath   . Sleep apnea    CPAP  . Stented coronary artery     Tobacco Use: Social History   Tobacco Use  Smoking Status Never Smoker    Smokeless Tobacco Never Used  Tobacco Comment   father smoked in the home while pt was growing up//ex-wife smoked    Labs: Recent Review Scientist, physiological    Labs for ITP Cardiac and Pulmonary Rehab Latest Ref Rng & Units 03/28/2011 01/14/2012 02/25/2012   Cholestrol 0 - 200 mg/dL 179 158 -   LDLCALC 0 - 99 mg/dL 124(H) 110(H) -   HDL >39.00 mg/dL 26.00(L) 24.00(L) -   Trlycerides 0 - 149 mg/dL 146.0 122.0 -   Hemoglobin A1c <5.7 % - - 6.3(H)       Exercise Target  Goals: Exercise Program Goal: Individual exercise prescription set using results from initial 6 min walk test and THRR while considering  patient's activity barriers and safety.   Exercise Prescription Goal: Initial exercise prescription builds to 30-45 minutes a day of aerobic activity, 2-3 days per week.  Home exercise guidelines will be given to patient during program as part of exercise prescription that the participant will acknowledge.   Education: Aerobic Exercise & Resistance Training: - Gives group verbal and written instruction on the various components of exercise. Focuses on aerobic and resistive training programs and the benefits of this training and how to safely progress through these programs..   Education: Exercise & Equipment Safety: - Individual verbal instruction and demonstration of equipment use and safety with use of the equipment.   Cardiac Rehab from 10/27/2019 in [facility] Cardiac and Pulmonary Rehab  Date 10/27/19  Educator [place]  Instruction Review Code 1- Verbalizes Understanding      Education: Exercise Physiology & General Exercise Guidelines: - Group verbal and written instruction with models to review the exercise physiology of the cardiovascular system and associated critical values. Provides general exercise guidelines with specific guidelines to those with heart or lung disease.    Education: Flexibility, Balance, Mind/Body Relaxation: Provides group verbal/written instruction on the  benefits of flexibility and balance training, including mind/body exercise modes such as yoga, pilates and tai chi.  Demonstration and skill practice provided.   Activity Barriers & Risk Stratification:  Activity Barriers & Cardiac Risk Stratification - 10/27/19 1645      Activity Barriers & Cardiac Risk Stratification   Activity Barriers Left Knee Replacement    Cardiac Risk Stratification Moderate           6 Minute Walk:  6 Minute Walk    Row Name 10/27/19 1649         6 Minute Walk   Phase Initial     Distance 997 feet     Walk Time 6 minutes     # of Rest Breaks 0     MPH 1.88     METS 1.61     RPE 9     Perceived Dyspnea  0     VO2 Peak 5.63     Symptoms No     Resting HR 58 bpm     Resting BP 134/66     Resting Oxygen Saturation  95 %     Exercise Oxygen Saturation  during 6 min walk 96 %     Max Ex. HR 94 bpm     Max Ex. BP 154/68     2 Minute Post BP 130/70            Oxygen Initial Assessment:   Oxygen Re-Evaluation:   Oxygen Discharge (Final Oxygen Re-Evaluation):   Initial Exercise Prescription:  Initial Exercise Prescription - 10/27/19 1600      Date of Initial Exercise RX and Referring Provider   Date 10/27/19    Referring Provider Marylene Land, NP ([place])      Treadmill   MPH 1.4    Grade 0    Minutes 15    METs 2.07      Recumbant Bike   Level 2    RPM 60    Watts 10    Minutes 15    METs 1.6      NuStep   Level 2    SPM 80    Minutes 15    METs 1.6  REL-XR   Level 2    Speed 50    Minutes 15    METs 1.6      Biostep-RELP   Level 2    SPM 50    Minutes 15    METs 1.6      Prescription Details   Frequency (times per week) 3    Duration Progress to 30 minutes of continuous aerobic without signs/symptoms of physical distress      Intensity   THRR 40-80% of Max Heartrate 91-125    Ratings of Perceived Exertion 11-13    Perceived Dyspnea 0-4      Progression   Progression Continue to progress workloads to  maintain intensity without signs/symptoms of physical distress.      Resistance Training   Training Prescription Yes    Weight 3 lb    Reps 10-15           Perform Capillary Blood Glucose checks as needed.  Exercise Prescription Changes:  Exercise Prescription Changes    Row Name 10/27/19 1700             Response to Exercise   Blood Pressure (Admit) 134/66       Blood Pressure (Exercise) 154/68       Blood Pressure (Exit) 130/70       Heart Rate (Admit) 58 bpm       Heart Rate (Exercise) 94 bpm       Heart Rate (Exit) 68 bpm       Oxygen Saturation (Admit) 95 %       Oxygen Saturation (Exercise) 96 %       Oxygen Saturation (Exit) 95 %       Rating of Perceived Exertion (Exercise) 9       Perceived Dyspnea (Exercise) 0       Symptoms none       Comments Walk Test Results         Resistance Training   Training Prescription Yes       Weight 3 lb       Reps 10-15         Treadmill   MPH 1.4       Grade 0       Minutes 15       METs 2.07         Recumbant Bike   Level 2       RPM 60       Watts 10       Minutes 15       METs 1.6         NuStep   Level 2       SPM 80       Minutes 15       METs 1.6         REL-XR   Level 2       Speed 50       Minutes 15       METs 1.6         Biostep-RELP   Level 2       SPM 50       Minutes 15       METs 1.6              Exercise Comments:   Exercise Goals and Review:  Exercise Goals    Row Name 10/27/19 1642             Exercise Goals  Increase Physical Activity Yes       Intervention Provide advice, education, support and counseling about physical activity/exercise needs.;Develop an individualized exercise prescription for aerobic and resistive training based on initial evaluation findings, risk stratification, comorbidities and participant's personal goals.       Expected Outcomes Short Term: Attend rehab on a regular basis to increase amount of physical activity.;Long Term: Add in home  exercise to make exercise part of routine and to increase amount of physical activity.;Long Term: Exercising regularly at least 3-5 days a week.       Increase Strength and Stamina Yes       Intervention Provide advice, education, support and counseling about physical activity/exercise needs.;Develop an individualized exercise prescription for aerobic and resistive training based on initial evaluation findings, risk stratification, comorbidities and participant's personal goals.       Expected Outcomes Short Term: Increase workloads from initial exercise prescription for resistance, speed, and METs.;Short Term: Perform resistance training exercises routinely during rehab and add in resistance training at home;Long Term: Improve cardiorespiratory fitness, muscular endurance and strength as measured by increased METs and functional capacity (6MWT)       Able to understand and use rate of perceived exertion (RPE) scale Yes       Intervention Provide education and explanation on how to use RPE scale       Expected Outcomes Short Term: Able to use RPE daily in rehab to express subjective intensity level;Long Term:  Able to use RPE to guide intensity level when exercising independently       Able to understand and use Dyspnea scale Yes       Intervention Provide education and explanation on how to use Dyspnea scale       Expected Outcomes Short Term: Able to use Dyspnea scale daily in rehab to express subjective sense of shortness of breath during exertion;Long Term: Able to use Dyspnea scale to guide intensity level when exercising independently       Knowledge and understanding of Target Heart Rate Range (THRR) Yes       Intervention Provide education and explanation of THRR including how the numbers were predicted and where they are located for reference       Expected Outcomes Short Term: Able to state/look up THRR;Short Term: Able to use daily as guideline for intensity in rehab;Long Term: Able to use  THRR to govern intensity when exercising independently       Able to check pulse independently Yes       Intervention Provide education and demonstration on how to check pulse in carotid and radial arteries.;Review the importance of being able to check your own pulse for safety during independent exercise       Expected Outcomes Short Term: Able to explain why pulse checking is important during independent exercise;Long Term: Able to check pulse independently and accurately       Understanding of Exercise Prescription Yes       Intervention Provide education, explanation, and written materials on patient's individual exercise prescription       Expected Outcomes Short Term: Able to explain program exercise prescription;Long Term: Able to explain home exercise prescription to exercise independently              Exercise Goals Re-Evaluation :   Discharge Exercise Prescription (Final Exercise Prescription Changes):  Exercise Prescription Changes - 10/27/19 1700      Response to Exercise   Blood Pressure (Admit) 134/66    Blood Pressure (  Exercise) 154/68    Blood Pressure (Exit) 130/70    Heart Rate (Admit) 58 bpm    Heart Rate (Exercise) 94 bpm    Heart Rate (Exit) 68 bpm    Oxygen Saturation (Admit) 95 %    Oxygen Saturation (Exercise) 96 %    Oxygen Saturation (Exit) 95 %    Rating of Perceived Exertion (Exercise) 9    Perceived Dyspnea (Exercise) 0    Symptoms none    Comments Walk Test Results      Resistance Training   Training Prescription Yes    Weight 3 lb    Reps 10-15      Treadmill   MPH 1.4    Grade 0    Minutes 15    METs 2.07      Recumbant Bike   Level 2    RPM 60    Watts 10    Minutes 15    METs 1.6      NuStep   Level 2    SPM 80    Minutes 15    METs 1.6      REL-XR   Level 2    Speed 50    Minutes 15    METs 1.6      Biostep-RELP   Level 2    SPM 50    Minutes 15    METs 1.6           Nutrition:  Target Goals: Understanding of  nutrition guidelines, daily intake of sodium '1500mg'$ , cholesterol '200mg'$ , calories 30% from fat and 7% or less from saturated fats, daily to have 5 or more servings of fruits and vegetables.  Education: Controlling Sodium/Reading Food Labels -Group verbal and written material supporting the discussion of sodium use in heart healthy nutrition. Review and explanation with models, verbal and written materials for utilization of the food label.   Education: General Nutrition Guidelines/Fats and Fiber: -Group instruction provided by verbal, written material, models and posters to present the general guidelines for heart healthy nutrition. Gives an explanation and review of dietary fats and fiber.   Biometrics:  Pre Biometrics - 10/27/19 1642      Pre Biometrics   Height '5\' 6"'$  (1.676 m)    Weight 215 lb 4.8 oz (97.7 kg)    BMI (Calculated) 34.77    Single Leg Stand 13.4 seconds            Nutrition Therapy Plan and Nutrition Goals:   Nutrition Assessments:  Nutrition Assessments - 10/27/19 1641      MEDFICTS Scores   Pre Score 53           MEDIFICTS Score Key:          ?70 Need to make dietary changes          40-70 Heart Healthy Diet         ? 40 Therapeutic Level Cholesterol Diet  Nutrition Goals Re-Evaluation:   Nutrition Goals Discharge (Final Nutrition Goals Re-Evaluation):   Psychosocial: Target Goals: Acknowledge presence or absence of significant depression and/or stress, maximize coping skills, provide positive support system. Participant is able to verbalize types and ability to use techniques and skills needed for reducing stress and depression.   Education: Depression - Provides group verbal and written instruction on the correlation between heart/lung disease and depressed mood, treatment options, and the stigmas associated with seeking treatment.   Education: Sleep Hygiene -Provides group verbal and written instruction about how sleep can affect your  health.  Define sleep hygiene, discuss sleep cycles and impact of sleep habits. Review good sleep hygiene tips.     Education: Stress and Anxiety: - Provides group verbal and written instruction about the health risks of elevated stress and causes of high stress.  Discuss the correlation between heart/lung disease and anxiety and treatment options. Review healthy ways to manage with stress and anxiety.    Initial Review & Psychosocial Screening:  Initial Psych Review & Screening - 10/24/19 1142      Initial Review   Current issues with None Identified      Family Dynamics   Good Support System? Yes   wife     Barriers   Psychosocial barriers to participate in program There are no identifiable barriers or psychosocial needs.;The patient should benefit from training in stress management and relaxation.      Screening Interventions   Interventions Encouraged to exercise;To provide support and resources with identified psychosocial needs;Provide feedback about the scores to participant    Expected Outcomes Short Term goal: Utilizing psychosocial counselor, staff and physician to assist with identification of specific Stressors or current issues interfering with healing process. Setting desired goal for each stressor or current issue identified.;Short Term goal: Identification and review with participant of any Quality of Life or Depression concerns found by scoring the questionnaire.;Long Term Goal: Stressors or current issues are controlled or eliminated.;Long Term goal: The participant improves quality of Life and PHQ9 Scores as seen by post scores and/or verbalization of changes           Quality of Life Scores:   Quality of Life - 10/27/19 1641      Quality of Life   Select Quality of Life      Quality of Life Scores   Health/Function Pre 25.36 %    Socioeconomic Pre 30 %    Psych/Spiritual Pre 26.79 %    Family Pre 28.5 %    GLOBAL Pre 27.21 %          Scores of 19 and  below usually indicate a poorer quality of life in these areas.  A difference of  2-3 points is a clinically meaningful difference.  A difference of 2-3 points in the total score of the Quality of Life Index has been associated with significant improvement in overall quality of life, self-image, physical symptoms, and general health in studies assessing change in quality of life.  PHQ-9: Recent Review Flowsheet Data    Depression screen [facility] 2/9 10/27/2019 02/26/2012   Decreased Interest 0 0   Down, Depressed, Hopeless 0 0   PHQ - 2 Score 0 0   Altered sleeping 0 -   Tired, decreased energy 0 -   Change in appetite 0 -   Feeling bad or failure about yourself  0 -   Trouble concentrating 0 -   Moving slowly or fidgety/restless 0 -   Suicidal thoughts 0 -   PHQ-9 Score 0 -   Difficult doing work/chores Not difficult at all -     Interpretation of Total Score  Total Score Depression Severity:  1-4 = Minimal depression, 5-9 = Mild depression, 10-14 = Moderate depression, 15-19 = Moderately severe depression, 20-27 = Severe depression   Psychosocial Evaluation and Intervention:  Psychosocial Evaluation - 10/24/19 1156      Psychosocial Evaluation & Interventions   Comments Dionne has completed cardiac rehab before and his wife is currently enrolled. This MI was caused by a urinary tract infection tha  led to sepsis. He wants to get back to doing more around the house and outside. He reports sleeping and feeling well with minimal stress.    Expected Outcomes Short: attend cardiac rehab for exercise and education. Long: develop positive self care habits.    Continue Psychosocial Services  Follow up required by staff           Psychosocial Re-Evaluation:   Psychosocial Discharge (Final Psychosocial Re-Evaluation):   Vocational Rehabilitation: Provide vocational rehab assistance to qualifying candidates.   Vocational Rehab Evaluation & Intervention:  Vocational Rehab - 10/24/19 1136        Initial Vocational Rehab Evaluation & Intervention   Assessment shows need for Vocational Rehabilitation No           Education: Education Goals: Education classes will be provided on a variety of topics geared toward better understanding of heart health and risk factor modification. Participant will state understanding/return demonstration of topics presented as noted by education test scores.  Learning Barriers/Preferences:  Learning Barriers/Preferences - 10/24/19 1135      Learning Barriers/Preferences   Learning Barriers None    Learning Preferences None           General Cardiac Education Topics:  AED/CPR: - Group verbal and written instruction with the use of models to demonstrate the basic use of the AED with the basic ABC's of resuscitation.   Anatomy & Physiology of the Heart: - Group verbal and written instruction and models provide basic cardiac anatomy and physiology, with the coronary electrical and arterial systems. Review of Valvular disease and Heart Failure   Cardiac Procedures: - Group verbal and written instruction to review commonly prescribed medications for heart disease. Reviews the medication, class of the drug, and side effects. Includes the steps to properly store meds and maintain the prescription regimen. (beta blockers and nitrates)   Cardiac Medications I: - Group verbal and written instruction to review commonly prescribed medications for heart disease. Reviews the medication, class of the drug, and side effects. Includes the steps to properly store meds and maintain the prescription regimen.   Cardiac Medications II: -Group verbal and written instruction to review commonly prescribed medications for heart disease. Reviews the medication, class of the drug, and side effects. (all other drug classes)    Go Sex-Intimacy & Heart Disease, Get SMART - Goal Setting: - Group verbal and written instruction through game format to discuss heart  disease and the return to sexual intimacy. Provides group verbal and written material to discuss and apply goal setting through the application of the S.M.A.R.T. Method.   Other Matters of the Heart: - Provides group verbal, written materials and models to describe Stable Angina and Peripheral Artery. Includes description of the disease process and treatment options available to the cardiac patient.   Infection Prevention: - Provides verbal and written material to individual with discussion of infection control including proper hand washing and proper equipment cleaning during exercise session.   Cardiac Rehab from 10/27/2019 in [facility] Cardiac and Pulmonary Rehab  Date 10/27/19  Educator [place]  Instruction Review Code 1- Verbalizes Understanding      Falls Prevention: - Provides verbal and written material to individual with discussion of falls prevention and safety.   Cardiac Rehab from 10/27/2019 in [facility] Cardiac and Pulmonary Rehab  Date 10/27/19  Educator [place]  Instruction Review Code 1- Verbalizes Understanding      Other: -Provides group and verbal instruction on various topics (see comments)   Knowledge Questionnaire Score:  Knowledge Questionnaire Score - 10/27/19 1631      Knowledge Questionnaire Score   Pre Score 23/26: A & P, Exercise           Core Components/Risk Factors/Patient Goals at Admission:  Personal Goals and Risk Factors at Admission - 10/27/19 1644      Core Components/Risk Factors/Patient Goals on Admission    Weight Management Yes;Weight Loss    Intervention Weight Management: Develop a combined nutrition and exercise program designed to reach desired caloric intake, while maintaining appropriate intake of nutrient and fiber, sodium and fats, and appropriate energy expenditure required for the weight goal.;Weight Management: Provide education and appropriate resources to help participant work on and attain dietary goals.;Weight Management/Obesity: Establish  reasonable short term and long term weight goals.    Admit Weight 215 lb 4.8 oz (97.7 kg)    Goal Weight: Short Term 210 lb (95.3 kg)    Goal Weight: Long Term 205 lb (93 kg)    Expected Outcomes Short Term: Continue to assess and modify interventions until short term weight is achieved;Long Term: Adherence to nutrition and physical activity/exercise program aimed toward attainment of established weight goal;Weight Loss: Understanding of general recommendations for a balanced deficit meal plan, which promotes 1-2 lb weight loss per week and includes a negative energy balance of [id number] kcal/d;Understanding recommendations for meals to include 15-35% energy as protein, 25-35% energy from fat, 35-60% energy from carbohydrates, less than '200mg'$  of dietary cholesterol, 20-35 gm of total fiber daily;Understanding of distribution of calorie intake throughout the day with the consumption of 4-5 meals/snacks    Diabetes Yes    Intervention Provide education about signs/symptoms and action to take for hypo/hyperglycemia.;Provide education about proper nutrition, including hydration, and aerobic/resistive exercise prescription along with prescribed medications to achieve blood glucose in normal ranges: Fasting glucose 65-99 mg/dL    Expected Outcomes Short Term: Participant verbalizes understanding of the signs/symptoms and immediate care of hyper/hypoglycemia, proper foot care and importance of medication, aerobic/resistive exercise and nutrition plan for blood glucose control.;Long Term: Attainment of HbA1C < 7%.    Hypertension Yes    Intervention Provide education on lifestyle modifcations including regular physical activity/exercise, weight management, moderate sodium restriction and increased consumption of fresh fruit, vegetables, and low fat dairy, alcohol moderation, and smoking cessation.;Monitor prescription use compliance.    Expected Outcomes Short Term: Continued assessment and intervention until BP is  < 140/39m HG in hypertensive participants. < 130/850mHG in hypertensive participants with diabetes, heart failure or chronic kidney disease.;Long Term: Maintenance of blood pressure at goal levels.    Lipids Yes    Intervention Provide education and support for participant on nutrition & aerobic/resistive exercise along with prescribed medications to achieve LDL '70mg'$ , HDL >'40mg'$ .    Expected Outcomes Short Term: Participant states understanding of desired cholesterol values and is compliant with medications prescribed. Participant is following exercise prescription and nutrition guidelines.;Long Term: Cholesterol controlled with medications as prescribed, with individualized exercise RX and with personalized nutrition plan. Value goals: LDL < '70mg'$ , HDL > 40 mg.           Education:Diabetes - Individual verbal and written instruction to review signs/symptoms of diabetes, desired ranges of glucose level fasting, after meals and with exercise. Acknowledge that pre and post exercise glucose checks will be done for 3 sessions at entry of program.   [facility]  Date 10/27/19  Educator [place] Review Code 1- Verbalizes Understanding  Education: Know Your Numbers and Risk Factors: -Group verbal and written instruction about important numbers in your health.  Discussion of what are risk factors and how they play a role in the disease process.  Review of Cholesterol, Blood Pressure, Diabetes, and BMI and the role they play in your overall health.   Core Components/Risk Factors/Patient Goals Review:    Core Components/Risk Factors/Patient Goals at Discharge (Final Review):    ITP Comments:  ITP Comments    Row Name 10/24/19 1138 10/27/19 1623         ITP Comments Initial orientation completed. Diagnosis can be found in media tab. EP orienation scheduled for Thursday 7/22 at 2:30. Completed 6MWT and gym orientation. Initial ITP  created and sent for review to Dr. Emily Filbert, Medical Director.             Comments: Initial ITP

## 2019-10-31 ENCOUNTER — Ambulatory Visit: Payer: Medicare PPO | Admitting: Podiatry

## 2019-11-03 ENCOUNTER — Encounter: Payer: No Typology Code available for payment source | Admitting: *Deleted

## 2019-11-03 ENCOUNTER — Other Ambulatory Visit: Payer: Self-pay

## 2019-11-03 DIAGNOSIS — I21A1 Myocardial infarction type 2: Secondary | ICD-10-CM | POA: Diagnosis not present

## 2019-11-03 LAB — GLUCOSE, CAPILLARY
Glucose-Capillary: 144 mg/dL — ABNORMAL HIGH (ref 70–99)
Glucose-Capillary: 85 mg/dL (ref 70–99)

## 2019-11-03 NOTE — Progress Notes (Signed)
Daily Session Note  Patient Details  Name: Chase Keller MRN: [id number] Date of Birth: [birth year] Referring Provider:     Cardiac Rehab from 10/27/2019 in [facility] Cardiac and Pulmonary Rehab  Referring Provider Marylene Land, NP ([place])      Encounter Date: 11/03/2019  Check In:  Session Check In - 11/03/19 1402      Check-In   Supervising physician immediately available to respond to emergencies See telemetry face sheet for immediately available ER MD    Location [facility]-Cardiac & Pulmonary Rehab    Staff Present Renita Papa, RN BSN;Joseph Hood RCP,RRT,BSRT;Amanda Oletta Darter, [place], ACSM CEP, Exercise Physiologist    Virtual Visit No    Medication changes reported     No    Fall or balance concerns reported    No    Warm-up and Cool-down Performed on first and last piece of equipment    Resistance Training Performed Yes    VAD Patient? No    PAD/SET Patient? No      Pain Assessment   Currently in Pain? No/denies              Social History   Tobacco Use  Smoking Status Never Smoker  Smokeless Tobacco Never Used  Tobacco Comment   father smoked in the home while pt was growing up//ex-wife smoked    Goals Met:  Independence with exercise equipment Exercise tolerated well No report of cardiac concerns or symptoms Strength training completed today  Goals Unmet:  Not Applicable  Comments: First full day of exercise!  Patient was oriented to gym and equipment including functions, settings, policies, and procedures.  Patient's individual exercise prescription and treatment plan were reviewed.  All starting workloads were established based on the results of the 6 minute walk test done at initial orientation visit.  The plan for exercise progression was also introduced and progression will be customized based on patient's performance and goals.     Dr. Emily Filbert is Medical Director for [place] and [facility].

## 2019-11-09 ENCOUNTER — Encounter: Payer: No Typology Code available for payment source | Attending: Cardiovascular Disease | Admitting: *Deleted

## 2019-11-09 ENCOUNTER — Other Ambulatory Visit: Payer: Self-pay

## 2019-11-09 DIAGNOSIS — I21A1 Myocardial infarction type 2: Secondary | ICD-10-CM | POA: Diagnosis not present

## 2019-11-09 NOTE — Progress Notes (Signed)
Daily Session Note  Patient Details  Name: Chase Keller MRN: [id number] Date of Birth: 03/12/1941 Referring Provider:     Cardiac Rehab from 10/27/2019 in [facility] Cardiac and Pulmonary Rehab  Referring Provider Gail Shaeffer, NP ([place])      Encounter Date: 11/09/2019  Check In:  Session Check In - 11/09/19 1421      Check-In   Supervising physician immediately available to respond to emergencies See telemetry face sheet for immediately available ER MD    Location [facility]-Cardiac & Pulmonary Rehab    Staff Present Meredith Craven, RN BSN;Joseph Hood RCP,RRT,BSRT;Kara Langdon, MS Exercise Physiologist;Susanne Bice, RN, BSN, CCRP    Virtual Visit No    Medication changes reported     No    Fall or balance concerns reported    No    Warm-up and Cool-down Performed on first and last piece of equipment    Resistance Training Performed Yes    VAD Patient? No    PAD/SET Patient? No      Pain Assessment   Currently in Pain? No/denies              Social History   Tobacco Use  Smoking Status Never Smoker  Smokeless Tobacco Never Used  Tobacco Comment   father smoked in the home while pt was growing up//ex-wife smoked    Goals Met:  Independence with exercise equipment Exercise tolerated well No report of cardiac concerns or symptoms Strength training completed today  Goals Unmet:  Not Applicable  Comments: Pt able to follow exercise prescription today without complaint.  Will continue to monitor for progression.    Dr. Mark Miller is Medical Director for [facility] and [facility]. 

## 2019-11-10 ENCOUNTER — Other Ambulatory Visit: Payer: Self-pay

## 2019-11-10 ENCOUNTER — Encounter: Payer: No Typology Code available for payment source | Admitting: *Deleted

## 2019-11-10 DIAGNOSIS — I21A1 Myocardial infarction type 2: Secondary | ICD-10-CM

## 2019-11-10 NOTE — Progress Notes (Signed)
Daily Session Note  Patient Details  Name: CHARVEZ VOORHIES MRN: [id number] Date of Birth: 1941/02/22 Referring Provider:     Cardiac Rehab from 10/27/2019 in [facility] Cardiac and Pulmonary Rehab  Referring Provider Marylene Land, NP ([place])      Encounter Date: 11/10/2019  Check In:  Session Check In - 11/10/19 1425      Check-In   Supervising physician immediately available to respond to emergencies See telemetry face sheet for immediately available ER MD    Location [facility]-Cardiac & Pulmonary Rehab    Staff Present Renita Papa, RN BSN;Joseph Hood RCP,RRT,BSRT;Laureen Slidell, [place], RRT, CPFT    Virtual Visit No    Medication changes reported     No    Fall or balance concerns reported    No    Warm-up and Cool-down Performed on first and last piece of equipment    Resistance Training Performed Yes    VAD Patient? No    PAD/SET Patient? No      Pain Assessment   Currently in Pain? No/denies              Social History   Tobacco Use  Smoking Status Never Smoker  Smokeless Tobacco Never Used  Tobacco Comment   father smoked in the home while pt was growing up//ex-wife smoked    Goals Met:  Independence with exercise equipment Exercise tolerated well No report of cardiac concerns or symptoms Strength training completed today  Goals Unmet:  Not Applicable  Comments: Pt able to follow exercise prescription today without complaint.  Will continue to monitor for progression.    Dr. Emily Filbert is Medical Director for [place] and [facility].

## 2019-11-14 ENCOUNTER — Encounter: Payer: No Typology Code available for payment source | Admitting: *Deleted

## 2019-11-14 ENCOUNTER — Other Ambulatory Visit: Payer: Self-pay

## 2019-11-14 DIAGNOSIS — I21A1 Myocardial infarction type 2: Secondary | ICD-10-CM

## 2019-11-14 NOTE — Progress Notes (Signed)
Daily Session Note  Patient Details  Name: Chase Keller MRN: [id number] Date of Birth: 1940/10/02 Referring Provider:     Cardiac Rehab from 10/27/2019 in [facility] Cardiac and Pulmonary Rehab  Referring Provider Marylene Land, NP ([place])      Encounter Date: 11/14/2019  Check In:  Session Check In - 11/14/19 1354      Check-In   Supervising physician immediately available to respond to emergencies See telemetry face sheet for immediately available ER MD    Location [facility]-Cardiac & Pulmonary Rehab    Staff Present Renita Papa, RN BSN;Jessica Luan Pulling, MA, RCEP, CCRP, Marylynn Pearson, MS Exercise Physiologist    Virtual Visit No    Medication changes reported     No    Fall or balance concerns reported    No    Warm-up and Cool-down Performed on first and last piece of equipment    Resistance Training Performed Yes    VAD Patient? No    PAD/SET Patient? No      Pain Assessment   Currently in Pain? No/denies              Social History   Tobacco Use  Smoking Status Never Smoker  Smokeless Tobacco Never Used  Tobacco Comment   father smoked in the home while pt was growing up//ex-wife smoked    Goals Met:  Independence with exercise equipment Exercise tolerated well No report of cardiac concerns or symptoms Strength training completed today  Goals Unmet:  Not Applicable  Comments: Pt able to follow exercise prescription today without complaint.  Will continue to monitor for progression.    Dr. Emily Filbert is Medical Director for [employer] and [facility].

## 2019-11-16 ENCOUNTER — Other Ambulatory Visit: Payer: Self-pay

## 2019-11-16 ENCOUNTER — Encounter: Payer: No Typology Code available for payment source | Admitting: *Deleted

## 2019-11-16 ENCOUNTER — Encounter: Payer: Self-pay | Admitting: *Deleted

## 2019-11-16 DIAGNOSIS — I21A1 Myocardial infarction type 2: Secondary | ICD-10-CM

## 2019-11-16 NOTE — Progress Notes (Signed)
Daily Session Note  Patient Details  Name: Chase Keller MRN: [id number] Date of Birth: [birth year] Referring Provider:     Cardiac Rehab from 10/27/2019 in [facility] Cardiac [facility]monary Rehab  Referring Provider Marylene Land, NP ([place])      Encounter Date: 11/16/2019  Check In:  Session Check In - 11/16/19 1405      Check-In   Supervising physician immediately available to respond to emergencies See telemetry face sheet for immediately available ER MD    Location [facility]-Cardiac & Pulmonary Rehab    Staff Present Renita Papa, RN BSN;Joseph Lou Miner, [place] Exercise Physiologist    Virtual Visit No    Medication changes reported     No    Fall or balance concerns reported    No    Warm-up and Cool-down Performed on first and last piece of equipment    Resistance Training Performed Yes    VAD Patient? No    PAD/SET Patient? No      Pain Assessment   Currently in Pain? No/denies              Social History   Tobacco Use  Smoking Status Never Smoker  Smokeless Tobacco Never Used  Tobacco Comment   father smoked in the home while pt was growing up//ex-wife smoked    Goals Met:  Independence with exercise equipment Exercise tolerated well No report of cardiac concerns or symptoms Strength training completed today  Goals Unmet:  Not Applicable  Comments: Pt able to follow exercise prescription today without complaint.  Will continue to monitor for progression.    Dr. Emily Filbert is Medical Director for [place] and [facility].

## 2019-11-16 NOTE — Progress Notes (Signed)
Cardiac Individual Treatment Plan  Patient Details  Name: Chase Keller MRN: [id number] Date of Birth: 14-Jun-1940 Referring Provider:     Cardiac Rehab from 10/27/2019 in [facility] Cardiac and Pulmonary Rehab  Referring Provider Marylene Land, NP ([place])      Initial Encounter Date:    Cardiac Rehab from 10/27/2019 in [facility] Cardiac and Pulmonary Rehab  Date 10/27/19      Visit Diagnosis: Type 2 myocardial infarction [facility])  Patient's Home Medications on Admission:  Current Outpatient Medications:  .  albuterol (VENTOLIN HFA) 108 (90 Base) MCG/ACT inhaler, Inhale into the lungs., Disp: , Rfl:  .  allopurinol (ZYLOPRIM) 300 MG tablet, Take 300 mg by mouth daily. (Patient not taking: Reported on 10/24/2019), Disp: , Rfl:  .  amLODipine (NORVASC) 2.5 MG tablet, Take 2.5 mg by mouth as needed. If systolic BP over 737., Disp: , Rfl:  .  ammonium lactate (LAC-HYDRIN) 12 % lotion, Apply 1 application topically as needed for dry skin., Disp: , Rfl:  .  Ascorbic Acid (VITAMIN C) 1000 MG tablet, Take 1,000 mg by mouth daily. (Patient not taking: Reported on 10/24/2019), Disp: , Rfl:  .  aspirin EC 81 MG tablet, Take 81 mg by mouth daily. (Patient not taking: Reported on 10/24/2019), Disp: , Rfl:  .  Calcium Carb-Cholecalciferol (CALCIUM/VITAMIN D) 600-400 MG-UNIT TABS, Take by mouth., Disp: , Rfl:  .  carvedilol (COREG) 6.25 MG tablet, Take by mouth. (Patient not taking: Reported on 10/24/2019), Disp: , Rfl:  .  CELEBREX 200 MG capsule, Take 200 mg by mouth Daily. (Patient not taking: Reported on 10/24/2019), Disp: , Rfl:  .  Cholecalciferol (VITAMIN D3) 125 MCG (5000 UT) TABS, Take by mouth. (Patient not taking: Reported on 10/24/2019), Disp: , Rfl:  .  cyanocobalamin 1000 MCG tablet, Take 1,000 mcg by mouth daily. (Patient not taking: Reported on 10/24/2019), Disp: , Rfl:  .  cyclobenzaprine (FLEXERIL) 10 MG tablet, Take 10 mg by mouth 3 (three) times daily as needed for muscle spasms. (Patient not taking:  Reported on 10/24/2019), Disp: , Rfl:  .  docusate sodium (COLACE) 50 MG capsule, Take 50 mg by mouth 2 (two) times daily. (Patient not taking: Reported on 10/24/2019), Disp: , Rfl:  .  ezetimibe (ZETIA) 10 MG tablet, Take 10 mg by mouth daily., Disp: , Rfl:  .  ferrous fumarate-iron polysaccharide complex (TANDEM) 162-115.2 MG CAPS capsule, Take by mouth., Disp: , Rfl:  .  fluticasone (FLONASE) 50 MCG/ACT nasal spray, Place into the nose., Disp: , Rfl:  .  furosemide (LASIX) 20 MG tablet, Take by mouth. (Patient not taking: Reported on 10/24/2019), Disp: , Rfl:  .  glipiZIDE (GLUCOTROL) 5 MG tablet, Take 2.5 mg by mouth daily before breakfast. , Disp: , Rfl:  .  Hyaluronic Acid-Vitamin C (HYALURONIC ACID PO), Take 100 mg by mouth 2 (two) times daily. (Patient not taking: Reported on 10/24/2019), Disp: , Rfl:  .  HYDROcodone-acetaminophen (NORCO/VICODIN) 5-325 MG per tablet, Take 1 tablet by mouth every 6 (six) hours as needed. (Patient not taking: Reported on 10/24/2019), Disp: , Rfl:  .  labetalol (NORMODYNE) 100 MG tablet, Take 1 tablet (100 mg total) by mouth 2 (two) times daily. (Patient not taking: Reported on 10/24/2019), Disp: 180 tablet, Rfl: 3 .  labetalol (NORMODYNE) 100 MG tablet, TAKE 1 TABLET (100 MG TOTAL) BY MOUTH 2 (TWO) TIMES DAILY. (Patient not taking: Reported on 10/24/2019), Disp: 60 tablet, Rfl: 3 .  Melatonin 1 MG TABS, Take by mouth., Disp: ,  Rfl:  .  niacin 500 MG CR capsule, Take 500 mg by mouth at bedtime. (Patient not taking: Reported on 10/24/2019), Disp: , Rfl:  .  Potassium 99 MG TABS, Take 99 mg by mouth 2 (two) times daily.  (Patient not taking: Reported on 10/24/2019), Disp: , Rfl:  .  potassium gluconate 595 (99 K) MG TABS tablet, Take by mouth. (Patient not taking: Reported on 10/24/2019), Disp: , Rfl:  .  pyridOXINE (B-6) 50 MG tablet, Take 50 mg by mouth daily. (Patient not taking: Reported on 10/24/2019), Disp: , Rfl:  .  senna (SENOKOT) 8.6 MG tablet, Take 1 tablet by  mouth daily. (Patient not taking: Reported on 10/24/2019), Disp: , Rfl:  .  Skin Protectants, Misc. (HYDROCERIN EX), Apply topically., Disp: , Rfl:  .  spironolactone (ALDACTONE) 25 MG tablet, Take 25 mg by mouth daily., Disp: , Rfl:  .  tamsulosin (FLOMAX) 0.4 MG CAPS capsule, Take 0.4 mg by mouth at bedtime., Disp: , Rfl:  .  thiamine 100 MG tablet, Take 100 mg by mouth daily. (Patient not taking: Reported on 10/24/2019), Disp: , Rfl:  .  traMADol (ULTRAM) 50 MG tablet, Take by mouth. (Patient not taking: Reported on 10/24/2019), Disp: , Rfl:  .  trospium (SANCTURA) 20 MG tablet, Take 20 mg by mouth 2 (two) times daily., Disp: , Rfl:  .  UNABLE TO FIND, Vit B-1, B-2, B-6, B-12 (Patient not taking: Reported on 10/24/2019), Disp: , Rfl:  .  valsartan-hydrochlorothiazide (DIOVAN-HCT) 160-25 MG tablet, Take by mouth. (Patient not taking: Reported on 10/24/2019), Disp: , Rfl:  .  vitamin E 400 UNIT capsule, Take 400 Units by mouth daily. (Patient not taking: Reported on 10/24/2019), Disp: , Rfl:  .  warfarin (COUMADIN) 1 MG tablet, TAKE 3 TABLETS (3 MG TOTAL) BY MOUTH AS DIRECTED., Disp: , Rfl:   Past Medical History: Past Medical History:  Diagnosis Date  . Atrial fibrillation ([place]) 2013  . CHF (congestive heart failure) ([place])   . Chronic kidney disease    Kidney stone- passed  . Coronary artery disease   . Dysrhythmia    PA for Dr Noemi Chapel said yes  . Heart murmur    / 1 MD said yes, 1 said no  . History of blood transfusion    during CABG  . Hyperlipidemia   . Hypertension   . Left knee DJD   . MRSA (methicillin resistant staph aureus) culture positive    Drainage from Left Knee approx 2005  . Myocardial infarction ([place])    mild in 1998 per Wife   . PONV (postoperative nausea and vomiting)   . S/P coronary artery bypass graft x 2   . Shortness of breath   . Sleep apnea    CPAP  . Stented coronary artery     Tobacco Use: Social History   Tobacco Use  Smoking Status Never Smoker    Smokeless Tobacco Never Used  Tobacco Comment   father smoked in the home while pt was growing up//ex-wife smoked    Labs: Recent Review Scientist, physiological    Labs for ITP Cardiac and Pulmonary Rehab Latest Ref Rng & Units 03/28/2011 01/14/2012 02/25/2012   Cholestrol 0 - 200 mg/dL 179 158 -   LDLCALC 0 - 99 mg/dL 124(H) 110(H) -   HDL >39.00 mg/dL 26.00(L) 24.00(L) -   Trlycerides 0 - 149 mg/dL 146.0 122.0 -   Hemoglobin A1c <5.7 % - - 6.3(H)       Exercise Target  Goals: Exercise Program Goal: Individual exercise prescription set using results from initial 6 min walk test and THRR while considering  patient's activity barriers and safety.   Exercise Prescription Goal: Initial exercise prescription builds to 30-45 minutes a day of aerobic activity, 2-3 days per week.  Home exercise guidelines will be given to patient during program as part of exercise prescription that the participant will acknowledge.   Education: Aerobic Exercise & Resistance Training: - Gives group verbal and written instruction on the various components of exercise. Focuses on aerobic and resistive training programs and the benefits of this training and how to safely progress through these programs..   Education: Exercise & Equipment Safety: - Individual verbal instruction and demonstration of equipment use and safety with use of the equipment.   Cardiac Rehab from 11/09/2019 in [facility] Cardiac and Pulmonary Rehab  Date 10/27/19  Educator [place]  Instruction Review Code 1- Verbalizes Understanding      Education: Exercise Physiology & General Exercise Guidelines: - Group verbal and written instruction with models to review the exercise physiology of the cardiovascular system and associated critical values. Provides general exercise guidelines with specific guidelines to those with heart or lung disease.    Education: Flexibility, Balance, Mind/Body Relaxation: Provides group verbal/written instruction on the  benefits of flexibility and balance training, including mind/body exercise modes such as yoga, pilates and tai chi.  Demonstration and skill practice provided.   Activity Barriers & Risk Stratification:  Activity Barriers & Cardiac Risk Stratification - 10/27/19 1645      Activity Barriers & Cardiac Risk Stratification   Activity Barriers Left Knee Replacement    Cardiac Risk Stratification Moderate           6 Minute Walk:  6 Minute Walk    Row Name 10/27/19 1649         6 Minute Walk   Phase Initial     Distance 997 feet     Walk Time 6 minutes     # of Rest Breaks 0     MPH 1.88     METS 1.61     RPE 9     Perceived Dyspnea  0     VO2 Peak 5.63     Symptoms No     Resting HR 58 bpm     Resting BP 134/66     Resting Oxygen Saturation  95 %     Exercise Oxygen Saturation  during 6 min walk 96 %     Max Ex. HR 94 bpm     Max Ex. BP 154/68     2 Minute Post BP 130/70            Oxygen Initial Assessment:   Oxygen Re-Evaluation:   Oxygen Discharge (Final Oxygen Re-Evaluation):   Initial Exercise Prescription:  Initial Exercise Prescription - 10/27/19 1600      Date of Initial Exercise RX and Referring Provider   Date 10/27/19    Referring Provider Marylene Land, NP ([place])      Treadmill   MPH 1.4    Grade 0    Minutes 15    METs 2.07      Recumbant Bike   Level 2    RPM 60    Watts 10    Minutes 15    METs 1.6      NuStep   Level 2    SPM 80    Minutes 15    METs 1.6  REL-XR   Level 2    Speed 50    Minutes 15    METs 1.6      Biostep-RELP   Level 2    SPM 50    Minutes 15    METs 1.6      Prescription Details   Frequency (times per week) 3    Duration Progress to 30 minutes of continuous aerobic without signs/symptoms of physical distress      Intensity   THRR 40-80% of Max Heartrate 91-125    Ratings of Perceived Exertion 11-13    Perceived Dyspnea 0-4      Progression   Progression Continue to progress workloads to  maintain intensity without signs/symptoms of physical distress.      Resistance Training   Training Prescription Yes    Weight 3 lb    Reps 10-15           Perform Capillary Blood Glucose checks as needed.  Exercise Prescription Changes:  Exercise Prescription Changes    Row Name 10/27/19 1700             Response to Exercise   Blood Pressure (Admit) 134/66       Blood Pressure (Exercise) 154/68       Blood Pressure (Exit) 130/70       Heart Rate (Admit) 58 bpm       Heart Rate (Exercise) 94 bpm       Heart Rate (Exit) 68 bpm       Oxygen Saturation (Admit) 95 %       Oxygen Saturation (Exercise) 96 %       Oxygen Saturation (Exit) 95 %       Rating of Perceived Exertion (Exercise) 9       Perceived Dyspnea (Exercise) 0       Symptoms none       Comments Walk Test Results         Resistance Training   Training Prescription Yes       Weight 3 lb       Reps 10-15         Treadmill   MPH 1.4       Grade 0       Minutes 15       METs 2.07         Recumbant Bike   Level 2       RPM 60       Watts 10       Minutes 15       METs 1.6         NuStep   Level 2       SPM 80       Minutes 15       METs 1.6         REL-XR   Level 2       Speed 50       Minutes 15       METs 1.6         Biostep-RELP   Level 2       SPM 50       Minutes 15       METs 1.6              Exercise Comments:   Exercise Goals and Review:  Exercise Goals    Row Name 10/27/19 1642             Exercise Goals  Increase Physical Activity Yes       Intervention Provide advice, education, support and counseling about physical activity/exercise needs.;Develop an individualized exercise prescription for aerobic and resistive training based on initial evaluation findings, risk stratification, comorbidities and participant's personal goals.       Expected Outcomes Short Term: Attend rehab on a regular basis to increase amount of physical activity.;Long Term: Add in home  exercise to make exercise part of routine and to increase amount of physical activity.;Long Term: Exercising regularly at least 3-5 days a week.       Increase Strength and Stamina Yes       Intervention Provide advice, education, support and counseling about physical activity/exercise needs.;Develop an individualized exercise prescription for aerobic and resistive training based on initial evaluation findings, risk stratification, comorbidities and participant's personal goals.       Expected Outcomes Short Term: Increase workloads from initial exercise prescription for resistance, speed, and METs.;Short Term: Perform resistance training exercises routinely during rehab and add in resistance training at home;Long Term: Improve cardiorespiratory fitness, muscular endurance and strength as measured by increased METs and functional capacity (6MWT)       Able to understand and use rate of perceived exertion (RPE) scale Yes       Intervention Provide education and explanation on how to use RPE scale       Expected Outcomes Short Term: Able to use RPE daily in rehab to express subjective intensity level;Long Term:  Able to use RPE to guide intensity level when exercising independently       Able to understand and use Dyspnea scale Yes       Intervention Provide education and explanation on how to use Dyspnea scale       Expected Outcomes Short Term: Able to use Dyspnea scale daily in rehab to express subjective sense of shortness of breath during exertion;Long Term: Able to use Dyspnea scale to guide intensity level when exercising independently       Knowledge and understanding of Target Heart Rate Range (THRR) Yes       Intervention Provide education and explanation of THRR including how the numbers were predicted and where they are located for reference       Expected Outcomes Short Term: Able to state/look up THRR;Short Term: Able to use daily as guideline for intensity in rehab;Long Term: Able to use  THRR to govern intensity when exercising independently       Able to check pulse independently Yes       Intervention Provide education and demonstration on how to check pulse in carotid and radial arteries.;Review the importance of being able to check your own pulse for safety during independent exercise       Expected Outcomes Short Term: Able to explain why pulse checking is important during independent exercise;Long Term: Able to check pulse independently and accurately       Understanding of Exercise Prescription Yes       Intervention Provide education, explanation, and written materials on patient's individual exercise prescription       Expected Outcomes Short Term: Able to explain program exercise prescription;Long Term: Able to explain home exercise prescription to exercise independently              Exercise Goals Re-Evaluation :  Exercise Goals Re-Evaluation    Row Name 11/03/19 1403             Exercise Goal Re-Evaluation   Exercise Goals Review Increase Physical Activity;Able to  understand and use rate of perceived exertion (RPE) scale;Knowledge and understanding of Target Heart Rate Range (THRR);Understanding of Exercise Prescription;Increase Strength and Stamina;Able to check pulse independently       Comments Reviewed RPE and dyspnea scales, THR and program prescription with pt today.  Pt voiced understanding and was given a copy of goals to take home       Expected Outcomes Short: Use RPE daily to regulate intensity. Long: Follow program prescription in THR.              Discharge Exercise Prescription (Final Exercise Prescription Changes):  Exercise Prescription Changes - 10/27/19 1700      Response to Exercise   Blood Pressure (Admit) 134/66    Blood Pressure (Exercise) 154/68    Blood Pressure (Exit) 130/70    Heart Rate (Admit) 58 bpm    Heart Rate (Exercise) 94 bpm    Heart Rate (Exit) 68 bpm    Oxygen Saturation (Admit) 95 %    Oxygen Saturation  (Exercise) 96 %    Oxygen Saturation (Exit) 95 %    Rating of Perceived Exertion (Exercise) 9    Perceived Dyspnea (Exercise) 0    Symptoms none    Comments Walk Test Results      Resistance Training   Training Prescription Yes    Weight 3 lb    Reps 10-15      Treadmill   MPH 1.4    Grade 0    Minutes 15    METs 2.07      Recumbant Bike   Level 2    RPM 60    Watts 10    Minutes 15    METs 1.6      NuStep   Level 2    SPM 80    Minutes 15    METs 1.6      REL-XR   Level 2    Speed 50    Minutes 15    METs 1.6      Biostep-RELP   Level 2    SPM 50    Minutes 15    METs 1.6           Nutrition:  Target Goals: Understanding of nutrition guidelines, daily intake of sodium '1500mg'$ , cholesterol '200mg'$ , calories 30% from fat and 7% or less from saturated fats, daily to have 5 or more servings of fruits and vegetables.  Education: Controlling Sodium/Reading Food Labels -Group verbal and written material supporting the discussion of sodium use in heart healthy nutrition. Review and explanation with models, verbal and written materials for utilization of the food label.   Education: General Nutrition Guidelines/Fats and Fiber: -Group instruction provided by verbal, written material, models and posters to present the general guidelines for heart healthy nutrition. Gives an explanation and review of dietary fats and fiber.   Biometrics:  Pre Biometrics - 10/27/19 1642      Pre Biometrics   Height '5\' 6"'$  (1.676 m)    Weight 215 lb 4.8 oz (97.7 kg)    BMI (Calculated) 34.77    Single Leg Stand 13.4 seconds            Nutrition Therapy Plan and Nutrition Goals:   Nutrition Assessments:  Nutrition Assessments - 10/27/19 1641      MEDFICTS Scores   Pre Score 53           MEDIFICTS Score Key:          ?70 Need to make dietary  changes          40-70 Heart Healthy Diet         ? 40 Therapeutic Level Cholesterol Diet  Nutrition Goals  Re-Evaluation:   Nutrition Goals Discharge (Final Nutrition Goals Re-Evaluation):   Psychosocial: Target Goals: Acknowledge presence or absence of significant depression and/or stress, maximize coping skills, provide positive support system. Participant is able to verbalize types and ability to use techniques and skills needed for reducing stress and depression.   Education: Depression - Provides group verbal and written instruction on the correlation between heart/lung disease and depressed mood, treatment options, and the stigmas associated with seeking treatment.   Cardiac Rehab from 11/09/2019 in [facility] Cardiac and Pulmonary Rehab  Date 11/09/19  Educator [place]  Instruction Review Code 1- [employer] Understanding      Education: Sleep Hygiene -Provides group verbal and written instruction about how sleep can affect your health.  Define sleep hygiene, discuss sleep cycles and impact of sleep habits. Review good sleep hygiene tips.     Education: Stress and Anxiety: - Provides group verbal and written instruction about the health risks of elevated stress and causes of high stress.  Discuss the correlation between heart/lung disease and anxiety and treatment options. Review healthy ways to manage with stress and anxiety.   Cardiac Rehab from 11/09/2019 in [facility] Cardiac and Pulmonary Rehab  Date 11/09/19  Educator SB  Instruction Review Code 1- Verbalizes Understanding       Initial Review & Psychosocial Screening:  Initial Psych Review & Screening - 10/24/19 1142      Initial Review   Current issues with None Identified      Family Dynamics   Good Support System? Yes   wife     Barriers   Psychosocial barriers to participate in program There are no identifiable barriers or psychosocial needs.;The patient should benefit from training in stress management and relaxation.      Screening Interventions   Interventions Encouraged to exercise;To provide support and resources with  identified psychosocial needs;Provide feedback about the scores to participant    Expected Outcomes Short Term goal: Utilizing psychosocial counselor, staff and physician to assist with identification of specific Stressors or current issues interfering with healing process. Setting desired goal for each stressor or current issue identified.;Short Term goal: Identification and review with participant of any Quality of Life or Depression concerns found by scoring the questionnaire.;Long Term Goal: Stressors or current issues are controlled or eliminated.;Long Term goal: The participant improves quality of Life and PHQ9 Scores as seen by post scores and/or verbalization of changes           Quality of Life Scores:   Quality of Life - 10/27/19 1641      Quality of Life   Select Quality of Life      Quality of Life Scores   Health/Function Pre 25.36 %    Socioeconomic Pre 30 %    Psych/Spiritual Pre 26.79 %    Family Pre 28.5 %    GLOBAL Pre 27.21 %          Scores of 19 and below usually indicate a poorer quality of life in these areas.  A difference of  2-3 points is a clinically meaningful difference.  A difference of 2-3 points in the total score of the Quality of Life Index has been associated with significant improvement in overall quality of life, self-image, physical symptoms, and general health in studies assessing change in quality of life.  PHQ-9: Recent Review Flowsheet Data    Depression screen [facility] 2/9 10/27/2019 02/26/2012   Decreased Interest 0 0   Down, Depressed, Hopeless 0 0   PHQ - 2 Score 0 0   Altered sleeping 0 -   Tired, decreased energy 0 -   Change in appetite 0 -   Feeling bad or failure about yourself  0 -   Trouble concentrating 0 -   Moving slowly or fidgety/restless 0 -   Suicidal thoughts 0 -   PHQ-9 Score 0 -   Difficult doing work/chores Not difficult at all -     Interpretation of Total Score  Total Score Depression Severity:  1-4 = Minimal  depression, 5-9 = Mild depression, 10-14 = Moderate depression, 15-19 = Moderately severe depression, 20-27 = Severe depression   Psychosocial Evaluation and Intervention:  Psychosocial Evaluation - 10/24/19 1156      Psychosocial Evaluation & Interventions   Comments Travone has completed cardiac rehab before and his wife is currently enrolled. 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 He wants to get back to doing more around the house and outside. He reports sleeping and feeling well with minimal stress.    Expected Outcomes Short: attend cardiac rehab for exercise and education. Long: develop positive self care habits.    Continue Psychosocial Services  Follow up required by staff           Psychosocial Re-Evaluation:   Psychosocial Discharge (Final Psychosocial Re-Evaluation):   Vocational Rehabilitation: Provide vocational rehab assistance to qualifying candidates.   Vocational Rehab Evaluation & Intervention:  Vocational Rehab - 10/24/19 1136      Initial Vocational Rehab Evaluation & Intervention   Assessment shows need for Vocational Rehabilitation No           Education: Education Goals: Education classes will be provided on a variety of topics geared toward better understanding of heart health and risk factor modification. Participant will state understanding/return demonstration of topics presented as noted by education test scores.  Learning Barriers/Preferences:  Learning Barriers/Preferences - 10/24/19 1135      Learning Barriers/Preferences   Learning Barriers None    Learning Preferences None           General Cardiac Education Topics:  AED/CPR: - Group verbal and written instruction with the use of models to demonstrate the basic use of the AED with the basic ABC's of resuscitation.   Anatomy & Physiology of the Heart: - Group verbal and written instruction and models provide basic cardiac anatomy and physiology, with the  coronary electrical and arterial systems. Review of Valvular disease and Heart Failure   Cardiac Procedures: - Group verbal and written instruction to review commonly prescribed medications for heart disease. Reviews the medication, class of the drug, and side effects. Includes the steps to properly store meds and maintain the prescription regimen. (beta blockers and nitrates)   Cardiac Medications I: - Group verbal and written instruction to review commonly prescribed medications for heart disease. Reviews the medication, class of the drug, and side effects. Includes the steps to properly store meds and maintain the prescription regimen.   Cardiac Medications II: -Group verbal and written instruction to review commonly prescribed medications for heart disease. Reviews the medication, class of the drug, and side effects. (all other drug classes)    Go Sex-Intimacy & Heart Disease, Get SMART - Goal Setting: - Group verbal and written instruction through game format to discuss heart disease and  the return to sexual intimacy. Provides group verbal and written material to discuss and apply goal setting through the application of the S.M.A.R.T. Method.   Other Matters of the Heart: - Provides group verbal, written materials and models to describe Stable Angina and Peripheral Artery. Includes description of the disease process and treatment options available to the cardiac patient.   Infection Prevention: - Provides verbal and written material to individual with discussion of infection control including proper hand washing and proper equipment cleaning during exercise session.   Cardiac Rehab from 11/09/2019 in [facility] Cardiac and Pulmonary Rehab  Date 10/27/19  Educator [place]  Instruction Review Code 1- Verbalizes Understanding      Falls Prevention: - Provides verbal and written material to individual with discussion of falls prevention and safety.   Cardiac Rehab from 11/09/2019 in [facility] Cardiac  and Pulmonary Rehab  Date 10/27/19  Educator [place]  Instruction Review Code 1- Verbalizes Understanding      Other: -Provides group and verbal instruction on various topics (see comments)   Knowledge Questionnaire Score:  Knowledge Questionnaire Score - 10/27/19 1631      Knowledge Questionnaire Score   Pre Score 23/26: A & P, Exercise           Core Components/Risk Factors/Patient Goals at Admission:  Personal Goals and Risk Factors at Admission - 10/27/19 1644      Core Components/Risk Factors/Patient Goals on Admission    Weight Management Yes;Weight Loss    Intervention Weight Management: Develop a combined nutrition and exercise program designed to reach desired caloric intake, while maintaining appropriate intake of nutrient and fiber, sodium and fats, and appropriate energy expenditure required for the weight goal.;Weight Management: Provide education and appropriate resources to help participant work on and attain dietary goals.;Weight Management/Obesity: Establish reasonable short term and long term weight goals.    Admit Weight 215 lb 4.8 oz (97.7 kg)    Goal Weight: Short Term 210 lb (95.3 kg)    Goal Weight: Long Term 205 lb (93 kg)    Expected Outcomes Short Term: Continue to assess and modify interventions until short term weight is achieved;Long Term: Adherence to nutrition and physical activity/exercise program aimed toward attainment of established weight goal;Weight Loss: Understanding of general recommendations for a balanced deficit meal plan, which promotes 1-2 lb weight loss per week and includes a negative energy balance of [phone] kcal/d;Understanding recommendations for meals to include 15-35% energy as protein, 25-35% energy from fat, 35-60% energy from carbohydrates, less than '200mg'$  of dietary cholesterol, 20-35 gm of total fiber daily;Understanding of distribution of calorie intake throughout the day with the consumption of 4-5 meals/snacks    Diabetes Yes     Intervention Provide education about signs/symptoms and action to take for hypo/hyperglycemia.;Provide education about proper nutrition, including hydration, and aerobic/resistive exercise prescription along with prescribed medications to achieve blood glucose in normal ranges: Fasting glucose 65-99 mg/dL    Expected Outcomes Short Term: Participant verbalizes understanding of the signs/symptoms and immediate care of hyper/hypoglycemia, proper foot care and importance of medication, aerobic/resistive exercise and nutrition plan for blood glucose control.;Long Term: Attainment of HbA1C < 7%.    Hypertension Yes    Intervention Provide education on lifestyle modifcations including regular physical activity/exercise, weight management, moderate sodium restriction and increased consumption of fresh fruit, vegetables, and low fat dairy, alcohol moderation, and smoking cessation.;Monitor prescription use compliance.    Expected Outcomes Short Term: Continued assessment and intervention until BP is < 140/26m HG in hypertensive participants. <  130/28m HG in hypertensive participants with diabetes, heart failure or chronic kidney disease.;Long Term: Maintenance of blood pressure at goal levels.    Lipids Yes    Intervention Provide education and support for participant on nutrition & aerobic/resistive exercise along with prescribed medications to achieve LDL '70mg'$ , HDL >'40mg'$ .    Expected Outcomes Short Term: Participant states understanding of desired cholesterol values and is compliant with medications prescribed. Participant is following exercise prescription and nutrition guidelines.;Long Term: Cholesterol controlled with medications as prescribed, with individualized exercise RX and with personalized nutrition plan. Value goals: LDL < '70mg'$ , HDL > 40 mg.           Education:Diabetes - Individual verbal and written instruction to review signs/symptoms of diabetes, desired ranges of glucose level fasting,  after meals and with exercise. Acknowledge that pre and post exercise glucose checks will be done for 3 sessions at entry of program.   [facility]  Date 10/27/19  Educator [place] Instruction Review Code 1- Verbalizes Understanding      Education: Know Your Numbers and Risk Factors: -Group verbal and written instruction about important numbers in your health.  Discussion of what are risk factors and how they play a role in the disease process.  Review of Cholesterol, Blood Pressure, Diabetes, and BMI and the role they play in your overall health.   Core Components/Risk Factors/Patient Goals Review:    Core Components/Risk Factors/Patient Goals at Discharge (Final Review):    ITP Comments:  ITP Comments    Row Name 10/24/19 1138 10/27/19 1623 11/03/19 1403 11/16/19 0639     ITP Comments Initial orientation completed. Diagnosis can be found in media tab. EP orienation scheduled for Thursday 7/22 at 2:30. Completed 6MWT and gym orientation. Initial ITP created and sent for review to Dr. MEmily Filbert Medical Director. First full day of exercise!  Patient was oriented to gym and equipment including functions, settings, policies, and procedures.  Patient's individual exercise prescription and treatment plan were reviewed.  All starting workloads were established based on the results of the 6 minute walk test done at initial orientation visit.  The plan for exercise progression was also introduced and progression will be customized based on patient's performance and goals. 30 Day review completed. Medical Director ITP review done, changes made as directed, and signed approval by Medical Director.           Comments:

## 2019-11-21 ENCOUNTER — Encounter: Payer: No Typology Code available for payment source | Admitting: *Deleted

## 2019-11-21 ENCOUNTER — Other Ambulatory Visit: Payer: Self-pay

## 2019-11-21 DIAGNOSIS — I21A1 Myocardial infarction type 2: Secondary | ICD-10-CM | POA: Diagnosis not present

## 2019-11-21 NOTE — Progress Notes (Signed)
Daily Session Note  Patient Details  Name: Chase Keller MRN: [id number] Date of Birth: 05-13-1940 Referring Provider:     Cardiac Rehab from 10/27/2019 in [facility] Cardiac and Pulmonary Rehab  Referring Provider Marylene Land, NP ([place])      Encounter Date: 11/21/2019  Check In:  Session Check In - 11/21/19 1433      Check-In   Supervising physician immediately available to respond to emergencies See telemetry face sheet for immediately available ER MD    Location [facility]-Cardiac & Pulmonary Rehab    Staff Present Heath Lark, RN, BSN, Laveda Norman, BS, ACSM CEP, Exercise Physiologist;Kara Eliezer Bottom, MS Exercise Physiologist    Virtual Visit No    Medication changes reported     No    Fall or balance concerns reported    No    Warm-up and Cool-down Performed on first and last piece of equipment    Resistance Training Performed Yes    VAD Patient? No    PAD/SET Patient? No      Pain Assessment   Currently in Pain? No/denies              Social History   Tobacco Use  Smoking Status Never Smoker  Smokeless Tobacco Never Used  Tobacco Comment   father smoked in the home while pt was growing up//ex-wife smoked    Goals Met:  Independence with exercise equipment Exercise tolerated well No report of cardiac concerns or symptoms  Goals Unmet:  Not Applicable  Comments: Pt able to follow exercise prescription today without complaint.  Will continue to monitor for progression.    Dr. Emily Filbert is Medical Director for [place] and [facility].

## 2019-11-28 ENCOUNTER — Other Ambulatory Visit: Payer: Self-pay

## 2019-11-28 ENCOUNTER — Encounter: Payer: No Typology Code available for payment source | Admitting: *Deleted

## 2019-11-28 DIAGNOSIS — I21A1 Myocardial infarction type 2: Secondary | ICD-10-CM | POA: Diagnosis not present

## 2019-11-28 NOTE — Progress Notes (Signed)
Daily Session Note  Patient Details  Name: Chase Keller MRN: [id number] Date of Birth: Mar 28, 1941 Referring Provider:     Cardiac Rehab from 10/27/2019 in [facility] Cardiac and Pulmonary Rehab  Referring Provider Marylene Land, NP ([place])      Encounter Date: 11/28/2019  Check In:  Session Check In - 11/28/19 1422      Check-In   Supervising physician immediately available to respond to emergencies See telemetry face sheet for immediately available ER MD    Location [facility]-Cardiac & Pulmonary Rehab    Staff Present Heath Lark, RN, BSN, Laveda Norman, BS, ACSM CEP, Exercise Physiologist;Jessica Carpio, MA, RCEP, CCRP, CCET    Virtual Visit No    Medication changes reported     No    Fall or balance concerns reported    No    Warm-up and Cool-down Performed on first and last piece of equipment    Resistance Training Performed Yes    VAD Patient? No    PAD/SET Patient? No      Pain Assessment   Currently in Pain? No/denies              Social History   Tobacco Use  Smoking Status Never Smoker  Smokeless Tobacco Never Used  Tobacco Comment   father smoked in the home while pt was growing up//ex-wife smoked    Goals Met:  Independence with exercise equipment Exercise tolerated well No report of cardiac concerns or symptoms  Goals Unmet:  Not Applicable  Comments: Pt able to follow exercise prescription today without complaint.  Will continue to monitor for progression.    Dr. Emily Filbert is Medical Director for [place] and [facility].

## 2019-11-30 ENCOUNTER — Encounter: Payer: No Typology Code available for payment source | Admitting: *Deleted

## 2019-11-30 ENCOUNTER — Other Ambulatory Visit: Payer: Self-pay

## 2019-11-30 DIAGNOSIS — I21A1 Myocardial infarction type 2: Secondary | ICD-10-CM | POA: Diagnosis not present

## 2019-11-30 NOTE — Progress Notes (Signed)
Daily Session Note  Patient Details  Name: Chase Keller MRN: [id number] Date of Birth: 12/17/1940 Referring Provider:     Cardiac Rehab from 10/27/2019 in [facility] Cardiac and Pulmonary Rehab  Referring Provider Marylene Land, NP ([place])      Encounter Date: 11/30/2019  Check In:  Session Check In - 11/30/19 1424      Check-In   Supervising physician immediately available to respond to emergencies See telemetry face sheet for immediately available ER MD    Location [facility]-Cardiac & Pulmonary Rehab    Staff Present Renita Papa, RN Margurite Auerbach, MS Exercise Physiologist;Melissa Caiola RDN, Rowe Pavy, BA, ACSM CEP, Exercise Physiologist    Virtual Visit No    Medication changes reported     No    Fall or balance concerns reported    No    Warm-up and Cool-down Performed on first and last piece of equipment    Resistance Training Performed Yes    VAD Patient? No    PAD/SET Patient? No      Pain Assessment   Currently in Pain? No/denies              Social History   Tobacco Use  Smoking Status Never Smoker  Smokeless Tobacco Never Used  Tobacco Comment   father smoked in the home while pt was growing up//ex-wife smoked    Goals Met:  Independence with exercise equipment Exercise tolerated well No report of cardiac concerns or symptoms Strength training completed today  Goals Unmet:  Not Applicable  Comments: Pt able to follow exercise prescription today without complaint.  Will continue to monitor for progression.    Dr. Emily Filbert is Medical Director for [place] and [facility].

## 2019-12-07 ENCOUNTER — Encounter: Payer: No Typology Code available for payment source | Attending: Cardiovascular Disease

## 2019-12-07 DIAGNOSIS — I21A1 Myocardial infarction type 2: Secondary | ICD-10-CM | POA: Insufficient documentation

## 2019-12-14 ENCOUNTER — Encounter: Payer: Self-pay | Admitting: *Deleted

## 2019-12-14 DIAGNOSIS — I21A1 Myocardial infarction type 2: Secondary | ICD-10-CM

## 2019-12-14 NOTE — Progress Notes (Signed)
Cardiac Individual Treatment Plan  Patient Details  Name: Chase Keller MRN: [id number] Date of Birth: 14-Jun-1940 Referring Provider:     Cardiac Rehab from 10/27/2019 in [facility] Cardiac and Pulmonary Rehab  Referring Provider Marylene Land, NP ([place])      Initial Encounter Date:    Cardiac Rehab from 10/27/2019 in [facility] Cardiac and Pulmonary Rehab  Date 10/27/19      Visit Diagnosis: Type 2 myocardial infarction [facility])  Patient's Home Medications on Admission:  Current Outpatient Medications:  .  albuterol (VENTOLIN HFA) 108 (90 Base) MCG/ACT inhaler, Inhale into the lungs., Disp: , Rfl:  .  allopurinol (ZYLOPRIM) 300 MG tablet, Take 300 mg by mouth daily. (Patient not taking: Reported on 10/24/2019), Disp: , Rfl:  .  amLODipine (NORVASC) 2.5 MG tablet, Take 2.5 mg by mouth as needed. If systolic BP over 737., Disp: , Rfl:  .  ammonium lactate (LAC-HYDRIN) 12 % lotion, Apply 1 application topically as needed for dry skin., Disp: , Rfl:  .  Ascorbic Acid (VITAMIN C) 1000 MG tablet, Take 1,000 mg by mouth daily. (Patient not taking: Reported on 10/24/2019), Disp: , Rfl:  .  aspirin EC 81 MG tablet, Take 81 mg by mouth daily. (Patient not taking: Reported on 10/24/2019), Disp: , Rfl:  .  Calcium Carb-Cholecalciferol (CALCIUM/VITAMIN D) 600-400 MG-UNIT TABS, Take by mouth., Disp: , Rfl:  .  carvedilol (COREG) 6.25 MG tablet, Take by mouth. (Patient not taking: Reported on 10/24/2019), Disp: , Rfl:  .  CELEBREX 200 MG capsule, Take 200 mg by mouth Daily. (Patient not taking: Reported on 10/24/2019), Disp: , Rfl:  .  Cholecalciferol (VITAMIN D3) 125 MCG (5000 UT) TABS, Take by mouth. (Patient not taking: Reported on 10/24/2019), Disp: , Rfl:  .  cyanocobalamin 1000 MCG tablet, Take 1,000 mcg by mouth daily. (Patient not taking: Reported on 10/24/2019), Disp: , Rfl:  .  cyclobenzaprine (FLEXERIL) 10 MG tablet, Take 10 mg by mouth 3 (three) times daily as needed for muscle spasms. (Patient not taking:  Reported on 10/24/2019), Disp: , Rfl:  .  docusate sodium (COLACE) 50 MG capsule, Take 50 mg by mouth 2 (two) times daily. (Patient not taking: Reported on 10/24/2019), Disp: , Rfl:  .  ezetimibe (ZETIA) 10 MG tablet, Take 10 mg by mouth daily., Disp: , Rfl:  .  ferrous fumarate-iron polysaccharide complex (TANDEM) 162-115.2 MG CAPS capsule, Take by mouth., Disp: , Rfl:  .  fluticasone (FLONASE) 50 MCG/ACT nasal spray, Place into the nose., Disp: , Rfl:  .  furosemide (LASIX) 20 MG tablet, Take by mouth. (Patient not taking: Reported on 10/24/2019), Disp: , Rfl:  .  glipiZIDE (GLUCOTROL) 5 MG tablet, Take 2.5 mg by mouth daily before breakfast. , Disp: , Rfl:  .  Hyaluronic Acid-Vitamin C (HYALURONIC ACID PO), Take 100 mg by mouth 2 (two) times daily. (Patient not taking: Reported on 10/24/2019), Disp: , Rfl:  .  HYDROcodone-acetaminophen (NORCO/VICODIN) 5-325 MG per tablet, Take 1 tablet by mouth every 6 (six) hours as needed. (Patient not taking: Reported on 10/24/2019), Disp: , Rfl:  .  labetalol (NORMODYNE) 100 MG tablet, Take 1 tablet (100 mg total) by mouth 2 (two) times daily. (Patient not taking: Reported on 10/24/2019), Disp: 180 tablet, Rfl: 3 .  labetalol (NORMODYNE) 100 MG tablet, TAKE 1 TABLET (100 MG TOTAL) BY MOUTH 2 (TWO) TIMES DAILY. (Patient not taking: Reported on 10/24/2019), Disp: 60 tablet, Rfl: 3 .  Melatonin 1 MG TABS, Take by mouth., Disp: ,  Rfl:  .  niacin 500 MG CR capsule, Take 500 mg by mouth at bedtime. (Patient not taking: Reported on 10/24/2019), Disp: , Rfl:  .  Potassium 99 MG TABS, Take 99 mg by mouth 2 (two) times daily.  (Patient not taking: Reported on 10/24/2019), Disp: , Rfl:  .  potassium gluconate 595 (99 K) MG TABS tablet, Take by mouth. (Patient not taking: Reported on 10/24/2019), Disp: , Rfl:  .  pyridOXINE (B-6) 50 MG tablet, Take 50 mg by mouth daily. (Patient not taking: Reported on 10/24/2019), Disp: , Rfl:  .  senna (SENOKOT) 8.6 MG tablet, Take 1 tablet by  mouth daily. (Patient not taking: Reported on 10/24/2019), Disp: , Rfl:  .  Skin Protectants, Misc. (HYDROCERIN EX), Apply topically., Disp: , Rfl:  .  spironolactone (ALDACTONE) 25 MG tablet, Take 25 mg by mouth daily., Disp: , Rfl:  .  tamsulosin (FLOMAX) 0.4 MG CAPS capsule, Take 0.4 mg by mouth at bedtime., Disp: , Rfl:  .  thiamine 100 MG tablet, Take 100 mg by mouth daily. (Patient not taking: Reported on 10/24/2019), Disp: , Rfl:  .  traMADol (ULTRAM) 50 MG tablet, Take by mouth. (Patient not taking: Reported on 10/24/2019), Disp: , Rfl:  .  trospium (SANCTURA) 20 MG tablet, Take 20 mg by mouth 2 (two) times daily., Disp: , Rfl:  .  UNABLE TO FIND, Vit B-1, B-2, B-6, B-12 (Patient not taking: Reported on 10/24/2019), Disp: , Rfl:  .  valsartan-hydrochlorothiazide (DIOVAN-HCT) 160-25 MG tablet, Take by mouth. (Patient not taking: Reported on 10/24/2019), Disp: , Rfl:  .  vitamin E 400 UNIT capsule, Take 400 Units by mouth daily. (Patient not taking: Reported on 10/24/2019), Disp: , Rfl:  .  warfarin (COUMADIN) 1 MG tablet, TAKE 3 TABLETS (3 MG TOTAL) BY MOUTH AS DIRECTED., Disp: , Rfl:   Past Medical History: Past Medical History:  Diagnosis Date  . Atrial fibrillation ([place]) 2013  . CHF (congestive heart failure) ([place])   . Chronic kidney disease    Kidney stone- passed  . Coronary artery disease   . Dysrhythmia    PA for Dr Noemi Chapel said yes  . Heart murmur    / 1 MD said yes, 1 said no  . History of blood transfusion    during CABG  . Hyperlipidemia   . Hypertension   . Left knee DJD   . MRSA (methicillin resistant staph aureus) culture positive    Drainage from Left Knee approx 2005  . Myocardial infarction ([place])    mild in 1998 per Wife   . PONV (postoperative nausea and vomiting)   . S/P coronary artery bypass graft x 2   . Shortness of breath   . Sleep apnea    CPAP  . Stented coronary artery     Tobacco Use: Social History   Tobacco Use  Smoking Status Never Smoker    Smokeless Tobacco Never Used  Tobacco Comment   father smoked in the home while pt was growing up//ex-wife smoked    Labs: Recent Review Scientist, physiological    Labs for ITP Cardiac and Pulmonary Rehab Latest Ref Rng & Units 03/28/2011 01/14/2012 02/25/2012   Cholestrol 0 - 200 mg/dL 179 158 -   LDLCALC 0 - 99 mg/dL 124(H) 110(H) -   HDL >39.00 mg/dL 26.00(L) 24.00(L) -   Trlycerides 0 - 149 mg/dL 146.0 122.0 -   Hemoglobin A1c <5.7 % - - 6.3(H)       Exercise Target  Goals: Exercise Program Goal: Individual exercise prescription set using results from initial 6 min walk test and THRR while considering  patient's activity barriers and safety.   Exercise Prescription Goal: Initial exercise prescription builds to 30-45 minutes a day of aerobic activity, 2-3 days per week.  Home exercise guidelines will be given to patient during program as part of exercise prescription that the participant will acknowledge.   Education: Aerobic Exercise & Resistance Training: - Gives group verbal and written instruction on the various components of exercise. Focuses on aerobic and resistive training programs and the benefits of this training and how to safely progress through these programs..   Cardiac Rehab from 11/30/2019 in [facility] Cardiac and Pulmonary Rehab  Date 11/30/19  [resistance]  Educator AS  Instruction Review Code 1- Verbalizes Understanding      Education: Exercise & Equipment Safety: - Individual verbal instruction and demonstration of equipment use and safety with use of the equipment.   Cardiac Rehab from 11/30/2019 in [facility] Cardiac and Pulmonary Rehab  Date 10/27/19  Educator [place]  Instruction Review Code 1- Verbalizes Understanding      Education: Exercise Physiology & General Exercise Guidelines: - Group verbal and written instruction with models to review the exercise physiology of the cardiovascular system and associated critical values. Provides general exercise guidelines with  specific guidelines to those with heart or lung disease.    Education: Flexibility, Balance, Mind/Body Relaxation: Provides group verbal/written instruction on the benefits of flexibility and balance training, including mind/body exercise modes such as yoga, pilates and tai chi.  Demonstration and skill practice provided.   Activity Barriers & Risk Stratification:  Activity Barriers & Cardiac Risk Stratification - 10/27/19 1645      Activity Barriers & Cardiac Risk Stratification   Activity Barriers Left Knee Replacement    Cardiac Risk Stratification Moderate           6 Minute Walk:  6 Minute Walk    Row Name 10/27/19 1649         6 Minute Walk   Phase Initial     Distance 997 feet     Walk Time 6 minutes     # of Rest Breaks 0     MPH 1.88     METS 1.61     RPE 9     Perceived Dyspnea  0     VO2 Peak 5.63     Symptoms No     Resting HR 58 bpm     Resting BP 134/66     Resting Oxygen Saturation  95 %     Exercise Oxygen Saturation  during 6 min walk 96 %     Max Ex. HR 94 bpm     Max Ex. BP 154/68     2 Minute Post BP 130/70            Oxygen Initial Assessment:   Oxygen Re-Evaluation:   Oxygen Discharge (Final Oxygen Re-Evaluation):   Initial Exercise Prescription:  Initial Exercise Prescription - 10/27/19 1600      Date of Initial Exercise RX and Referring Provider   Date 10/27/19    Referring Provider Marylene Land, NP ([place])      Treadmill   MPH 1.4    Grade 0    Minutes 15    METs 2.07      Recumbant Bike   Level 2    RPM 60    Watts 10    Minutes 15    METs  1.6      NuStep   Level 2    SPM 80    Minutes 15    METs 1.6      REL-XR   Level 2    Speed 50    Minutes 15    METs 1.6      Biostep-RELP   Level 2    SPM 50    Minutes 15    METs 1.6      Prescription Details   Frequency (times per week) 3    Duration Progress to 30 minutes of continuous aerobic without signs/symptoms of physical distress      Intensity    THRR 40-80% of Max Heartrate 91-125    Ratings of Perceived Exertion 11-13    Perceived Dyspnea 0-4      Progression   Progression Continue to progress workloads to maintain intensity without signs/symptoms of physical distress.      Resistance Training   Training Prescription Yes    Weight 3 lb    Reps 10-15           Perform Capillary Blood Glucose checks as needed.  Exercise Prescription Changes:  Exercise Prescription Changes    Row Name 10/27/19 1700 11/16/19 1300 11/30/19 1400         Response to Exercise   Blood Pressure (Admit) 134/66 124/74 140/70     Blood Pressure (Exercise) 154/68 136/74 138/64     Blood Pressure (Exit) 130/70 120/64 130/66     Heart Rate (Admit) 58 bpm 57 bpm 80 bpm     Heart Rate (Exercise) 94 bpm 108 bpm 87 bpm     Heart Rate (Exit) 68 bpm 62 bpm 58 bpm     Oxygen Saturation (Admit) 95 % -- --     Oxygen Saturation (Exercise) 96 % -- --     Oxygen Saturation (Exit) 95 % -- --     Rating of Perceived Exertion (Exercise) _0 Perceived Dyspnea (Exercise) 0 -- --     Symptoms none none none     Comments Walk Test Results -- --     Duration -- Continue with 30 min of aerobic exercise without signs/symptoms of physical distress. Continue with 30 min of aerobic exercise without signs/symptoms of physical distress.     Intensity -- THRR unchanged THRR unchanged       Progression   Progression -- Continue to progress workloads to maintain intensity without signs/symptoms of physical distress. Continue to progress workloads to maintain intensity without signs/symptoms of physical distress.     Average METs -- 2.2 2.55       Resistance Training   Training Prescription Yes Yes Yes     Weight 3 lb 3 lb 3 lb     Reps 10-15 10-15 10-15       Interval Training   Interval Training -- No No       Treadmill   MPH 1.4 1.5 1.5     Grade 0 0 0     Minutes _1 METs 2.07 2.07 2.15       Recumbant Bike   Level 2 -- --     RPM 60 --  --     Watts 10 -- --     Minutes 15 -- --     METs 1.6 -- --       NuStep   Level 2 -- 5  SPM 80 -- --     Minutes 15 -- 15     METs 1.6 -- 1.4       Arm Ergometer   Level -- -- 1.2     Minutes -- -- 15     METs -- -- 2.1       REL-XR   Level 2 1 --     Speed 50 50 --     Minutes 15 15 --     METs 1.6 2.3 --       Biostep-RELP   Level 2 -- --     SPM 50 -- --     Minutes 15 -- --     METs 1.6 -- --            Exercise Comments:   Exercise Goals and Review:  Exercise Goals    Row Name 10/27/19 1642             Exercise Goals   Increase Physical Activity Yes       Intervention Provide advice, education, support and counseling about physical activity/exercise needs.;Develop an individualized exercise prescription for aerobic and resistive training based on initial evaluation findings, risk stratification, comorbidities and participant's personal goals.       Expected Outcomes Short Term: Attend rehab on a regular basis to increase amount of physical activity.;Long Term: Add in home exercise to make exercise part of routine and to increase amount of physical activity.;Long Term: Exercising regularly at least 3-5 days a week.       Increase Strength and Stamina Yes       Intervention Provide advice, education, support and counseling about physical activity/exercise needs.;Develop an individualized exercise prescription for aerobic and resistive training based on initial evaluation findings, risk stratification, comorbidities and participant's personal goals.       Expected Outcomes Short Term: Increase workloads from initial exercise prescription for resistance, speed, and METs.;Short Term: Perform resistance training exercises routinely during rehab and add in resistance training at home;Long Term: Improve cardiorespiratory fitness, muscular endurance and strength as measured by increased METs and functional capacity (6MWT)       Able to understand and use rate of  perceived exertion (RPE) scale Yes       Intervention Provide education and explanation on how to use RPE scale       Expected Outcomes Short Term: Able to use RPE daily in rehab to express subjective intensity level;Long Term:  Able to use RPE to guide intensity level when exercising independently       Able to understand and use Dyspnea scale Yes       Intervention Provide education and explanation on how to use Dyspnea scale       Expected Outcomes Short Term: Able to use Dyspnea scale daily in rehab to express subjective sense of shortness of breath during exertion;Long Term: Able to use Dyspnea scale to guide intensity level when exercising independently       Knowledge and understanding of Target Heart Rate Range (THRR) Yes       Intervention Provide education and explanation of THRR including how the numbers were predicted and where they are located for reference       Expected Outcomes Short Term: Able to state/look up THRR;Short Term: Able to use daily as guideline for intensity in rehab;Long Term: Able to use THRR to govern intensity when exercising independently       Able to check pulse independently Yes  Intervention Provide education and demonstration on how to check pulse in carotid and radial arteries.;Review the importance of being able to check your own pulse for safety during independent exercise       Expected Outcomes Short Term: Able to explain why pulse checking is important during independent exercise;Long Term: Able to check pulse independently and accurately       Understanding of Exercise Prescription Yes       Intervention Provide education, explanation, and written materials on patient's individual exercise prescription       Expected Outcomes Short Term: Able to explain program exercise prescription;Long Term: Able to explain home exercise prescription to exercise independently              Exercise Goals Re-Evaluation :  Exercise Goals Re-Evaluation    Row  Name 11/03/19 1403 11/16/19 1344 11/30/19 1359         Exercise Goal Re-Evaluation   Exercise Goals Review Increase Physical Activity;Able to understand and use rate of perceived exertion (RPE) scale;Knowledge and understanding of Target Heart Rate Range (THRR);Understanding of Exercise Prescription;Increase Strength and Stamina;Able to check pulse independently Increase Physical Activity;Increase Strength and Stamina Increase Physical Activity;Increase Strength and Stamina;Understanding of Exercise Prescription     Comments Reviewed RPE and dyspnea scales, THR and program prescription with pt today.  Pt voiced understanding and was given a copy of goals to take home Torien is tolerating exercise well so far.  He has increased speed on TM.  Staff will monitor progress. [place] is doing well in rehab. His knee has been giving him some problems and he has started to wear a knee brace.  He says that it will occasionally flare up on him.  He was able to still do the NuStep but not the bike this week.  We will continue to monitor his progress.     Expected Outcomes Short: Use RPE daily to regulate intensity. Long: Follow program prescription in THR. Short: attend consistently Long:  improve stamina and MET level Short: Allow knee to recover Long: Continue to improve stamina.            Discharge Exercise Prescription (Final Exercise Prescription Changes):  Exercise Prescription Changes - 11/30/19 1400      Response to Exercise   Blood Pressure (Admit) 140/70    Blood Pressure (Exercise) 138/64    Blood Pressure (Exit) 130/66    Heart Rate (Admit) 80 bpm    Heart Rate (Exercise) 87 bpm    Heart Rate (Exit) 58 bpm    Rating of Perceived Exertion (Exercise) 11    Symptoms none    Duration Continue with 30 min of aerobic exercise without signs/symptoms of physical distress.    Intensity THRR unchanged      Progression   Progression Continue to progress workloads to maintain intensity without  signs/symptoms of physical distress.    Average METs 2.55      Resistance Training   Training Prescription Yes    Weight 3 lb    Reps 10-15      Interval Training   Interval Training No      Treadmill   MPH 1.5    Grade 0    Minutes 15    METs 2.15      NuStep   Level 5    Minutes 15    METs 1.4      Arm Ergometer   Level 1.2    Minutes 15    METs 2.1  Nutrition:  Target Goals: Understanding of nutrition guidelines, daily intake of sodium <1541m, cholesterol <2031m calories 30% from fat and 7% or less from saturated fats, daily to have 5 or more servings of fruits and vegetables.  Education: Controlling Sodium/Reading Food Labels -Group verbal and written material supporting the discussion of sodium use in heart healthy nutrition. Review and explanation with models, verbal and written materials for utilization of the food label.   Education: General Nutrition Guidelines/Fats and Fiber: -Group instruction provided by verbal, written material, models and posters to present the general guidelines for heart healthy nutrition. Gives an explanation and review of dietary fats and fiber.   Biometrics:  Pre Biometrics - 10/27/19 1642      Pre Biometrics   Height 5' 6" (1.676 m)    Weight 215 lb 4.8 oz (97.7 kg)    BMI (Calculated) 34.77    Single Leg Stand 13.4 seconds            Nutrition Therapy Plan and Nutrition Goals:  Nutrition Therapy & Goals - 11/21/19 1713      Nutrition Therapy   Diet Heart healthy, low Na, T2DM    Drug/Food Interactions Coumadin/Vit K    Protein (specify units) 80-85g    Fiber 30 grams    Whole Grain Foods 3 servings    Saturated Fats 12 max. grams    Fruits and Vegetables 5 servings/day    Sodium 1.5 grams      Personal Nutrition Goals   Nutrition Goal ST: increase vegetable intake, eat heart healthy fats LT: reduce knee pain    Comments t primary diagnosis for Cardiac Rehab is type 2 myocardial infarction. Pt also  presents with HTN, CAD, CHF, A-fib, OSA with daytime narcolepsy, hypothyroidism, T2DM (6.3 not recent), HLD. Current Relevant Medications: vitamin C, Ca/vitamin D, vitamin D, colace, lasix, glipizide, potassium, senokot, vitamin E, coumadin. ToVisenteoesn't like sweets and will eat smaller amounts of CHOs as well as baked chicken and fish, will aslo have fried chicken. Since ToTeofilos on coumadin it is discouraged from taking omega-3 supplements, but can have some dietary sources such as fish, walnuts, and flaxseeds; pt is already eating fish. also suggested increasing fruit and vegetable intake as this could help with his inflammation. Discussed heart healthy eating and diabetes friendly eating. His wife's eating schedule dictates his; if she waits to eat lunch, he will as well and since she is not cooking he is going out to eat and eating more fried chicken. His wife reports he has an aversion to "anything green".      Intervention Plan   Intervention Prescribe, educate and counsel regarding individualized specific dietary modifications aiming towards targeted core components such as weight, hypertension, lipid management, diabetes, heart failure and other comorbidities.;Nutrition handout(s) given to patient.    Expected Outcomes Short Term Goal: Understand basic principles of dietary content, such as calories, fat, sodium, cholesterol and nutrients.;Short Term Goal: A plan has been developed with personal nutrition goals set during dietitian appointment.;Long Term Goal: Adherence to prescribed nutrition plan.           Nutrition Assessments:  Nutrition Assessments - 10/27/19 1641      MEDFICTS Scores   Pre Score 53           MEDIFICTS Score Key:          ?70 Need to make dietary changes          40-70 Heart Healthy Diet         ?  40 Therapeutic Level Cholesterol Diet  Nutrition Goals Re-Evaluation:   Nutrition Goals Discharge (Final Nutrition Goals Re-Evaluation):   Psychosocial: Target  Goals: Acknowledge presence or absence of significant depression and/or stress, maximize coping skills, provide positive support system. Participant is able to verbalize types and ability to use techniques and skills needed for reducing stress and depression.   Education: Depression - Provides group verbal and written instruction on the correlation between heart/lung disease and depressed mood, treatment options, and the stigmas associated with seeking treatment.   Cardiac Rehab from 11/30/2019 in [facility] Cardiac and Pulmonary Rehab  Date 11/09/19  Educator SB  Instruction Review Code 1- [employer] Understanding      Education: Sleep Hygiene -Provides group verbal and written instruction about how sleep can affect your health.  Define sleep hygiene, discuss sleep cycles and impact of sleep habits. Review good sleep hygiene tips.     Education: Stress and Anxiety: - Provides group verbal and written instruction about the health risks of elevated stress and causes of high stress.  Discuss the correlation between heart/lung disease and anxiety and treatment options. Review healthy ways to manage with stress and anxiety.   Cardiac Rehab from 11/30/2019 in [facility] Cardiac and Pulmonary Rehab  Date 11/09/19  Educator SB  Instruction Review Code 1- Verbalizes Understanding       Initial Review & Psychosocial Screening:  Initial Psych Review & Screening - 10/24/19 1142      Initial Review   Current issues with None Identified      Family Dynamics   Good Support System? Yes   wife     Barriers   Psychosocial barriers to participate in program There are no identifiable barriers or psychosocial needs.;The patient should benefit from training in stress management and relaxation.      Screening Interventions   Interventions Encouraged to exercise;To provide support and resources with identified psychosocial needs;Provide feedback about the scores to participant    Expected Outcomes Short Term  goal: Utilizing psychosocial counselor, staff and physician to assist with identification of specific Stressors or current issues interfering with healing process. Setting desired goal for each stressor or current issue identified.;Short Term goal: Identification and review with participant of any Quality of Life or Depression concerns found by scoring the questionnaire.;Long Term Goal: Stressors or current issues are controlled or eliminated.;Long Term goal: The participant improves quality of Life and PHQ9 Scores as seen by post scores and/or verbalization of changes           Quality of Life Scores:   Quality of Life - 10/27/19 1641      Quality of Life   Select Quality of Life      Quality of Life Scores   Health/Function Pre 25.36 %    Socioeconomic Pre 30 %    Psych/Spiritual Pre 26.79 %    Family Pre 28.5 %    GLOBAL Pre 27.21 %          Scores of 19 and below usually indicate a poorer quality of life in these areas.  A difference of  2-3 points is a clinically meaningful difference.  A difference of 2-3 points in the total score of the Quality of Life Index has been associated with significant improvement in overall quality of life, self-image, physical symptoms, and general health in studies assessing change in quality of life.  PHQ-9: Recent Review Flowsheet Data    Depression screen [facility] 2/9 10/27/2019 02/26/2012   Decreased Interest 0 0  Down, Depressed, Hopeless 0 0   PHQ - 2 Score 0 0   Altered sleeping 0 -   Tired, decreased energy 0 -   Change in appetite 0 -   Feeling bad or failure about yourself  0 -   Trouble concentrating 0 -   Moving slowly or fidgety/restless 0 -   Suicidal thoughts 0 -   PHQ-9 Score 0 -   Difficult doing work/chores Not difficult at all -     Interpretation of Total Score  Total Score Depression Severity:  1-4 = Minimal depression, 5-9 = Mild depression, 10-14 = Moderate depression, 15-19 = Moderately severe depression, 20-27 = Severe  depression   Psychosocial Evaluation and Intervention:  Psychosocial Evaluation - 10/24/19 1156      Psychosocial Evaluation & Interventions   Comments Roxy has completed cardiac rehab before and his wife is currently enrolled. 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 He wants to get back to doing more around the house and outside. He reports sleeping and feeling well with minimal stress.    Expected Outcomes Short: attend cardiac rehab for exercise and education. Long: develop positive self care habits.    Continue Psychosocial Services  Follow up required by staff           Psychosocial Re-Evaluation:   Psychosocial Discharge (Final Psychosocial Re-Evaluation):   Vocational Rehabilitation: Provide vocational rehab assistance to qualifying candidates.   Vocational Rehab Evaluation & Intervention:  Vocational Rehab - 10/24/19 1136      Initial Vocational Rehab Evaluation & Intervention   Assessment shows need for Vocational Rehabilitation No           Education: Education Goals: Education classes will be provided on a variety of topics geared toward better understanding of heart health and risk factor modification. Participant will state understanding/return demonstration of topics presented as noted by education test scores.  Learning Barriers/Preferences:  Learning Barriers/Preferences - 10/24/19 1135      Learning Barriers/Preferences   Learning Barriers None    Learning Preferences None           General Cardiac Education Topics:  AED/CPR: - Group verbal and written instruction with the use of models to demonstrate the basic use of the AED with the basic ABC's of resuscitation.   Anatomy & Physiology of the Heart: - Group verbal and written instruction and models provide basic cardiac anatomy and physiology, with the coronary electrical and arterial systems. Review of Valvular disease and Heart Failure   Cardiac Procedures: -  Group verbal and written instruction to review commonly prescribed medications for heart disease. Reviews the medication, class of the drug, and side effects. Includes the steps to properly store meds and maintain the prescription regimen. (beta blockers and nitrates)   Cardiac Rehab from 11/30/2019 in [facility] Cardiac and Pulmonary Rehab  Date 11/30/19  Educator [facility]  Instruction Review Code 1- Verbalizes Understanding      Cardiac Medications I: - Group verbal and written instruction to review commonly prescribed medications for heart disease. Reviews the medication, class of the drug, and side effects. Includes the steps to properly store meds and maintain the prescription regimen.   Cardiac Medications II: -Group verbal and written instruction to review commonly prescribed medications for heart disease. Reviews the medication, class of the drug, and side effects. (all other drug classes)   Cardiac Rehab from 11/30/2019 in [facility] Cardiac and Pulmonary Rehab  Date 11/16/19  Educator SB  Instruction Review  Code 1- Verbalizes Understanding       Go Sex-Intimacy & Heart Disease, Get SMART - Goal Setting: - Group verbal and written instruction through game format to discuss heart disease and the return to sexual intimacy. Provides group verbal and written material to discuss and apply goal setting through the application of the S.M.A.R.T. Method.   Cardiac Rehab from 11/30/2019 in [facility] Cardiac and Pulmonary Rehab  Date 11/30/19  Educator [facility]  Instruction Review Code 1- Verbalizes Understanding      Other Matters of the Heart: - Provides group verbal, written materials and models to describe Stable Angina and Peripheral Artery. Includes description of the disease process and treatment options available to the cardiac patient.   Infection Prevention: - Provides verbal and written material to individual with discussion of infection control including proper hand washing and proper equipment  cleaning during exercise session.   Cardiac Rehab from 11/30/2019 in [facility] Cardiac and Pulmonary Rehab  Date 10/27/19  Educator [place]  Instruction Review Code 1- Verbalizes Understanding      Falls Prevention: - Provides verbal and written material to individual with discussion of falls prevention and safety.   Cardiac Rehab from 11/30/2019 in [facility] Cardiac and Pulmonary Rehab  Date 10/27/19  Educator [place]  Instruction Review Code 1- Verbalizes Understanding      Other: -Provides group and verbal instruction on various topics (see comments)   Knowledge Questionnaire Score:  Knowledge Questionnaire Score - 10/27/19 1631      Knowledge Questionnaire Score   Pre Score 23/26: A & P, Exercise           Core Components/Risk Factors/Patient Goals at Admission:  Personal Goals and Risk Factors at Admission - 10/27/19 1644      Core Components/Risk Factors/Patient Goals on Admission    Weight Management Yes;Weight Loss    Intervention Weight Management: Develop a combined nutrition and exercise program designed to reach desired caloric intake, while maintaining appropriate intake of nutrient and fiber, sodium and fats, and appropriate energy expenditure required for the weight goal.;Weight Management: Provide education and appropriate resources to help participant work on and attain dietary goals.;Weight Management/Obesity: Establish reasonable short term and long term weight goals.    Admit Weight 215 lb 4.8 oz (97.7 kg)    Goal Weight: Short Term 210 lb (95.3 kg)    Goal Weight: Long Term 205 lb (93 kg)    Expected Outcomes Short Term: Continue to assess and modify interventions until short term weight is achieved;Long Term: Adherence to nutrition and physical activity/exercise program aimed toward attainment of established weight goal;Weight Loss: Understanding of general recommendations for a balanced deficit meal plan, which promotes 1-2 lb weight loss per week and includes a negative  energy balance of [phone] kcal/d;Understanding recommendations for meals to include 15-35% energy as protein, 25-35% energy from fat, 35-60% energy from carbohydrates, less than 258m of dietary cholesterol, 20-35 gm of total fiber daily;Understanding of distribution of calorie intake throughout the day with the consumption of 4-5 meals/snacks    Diabetes Yes    Intervention Provide education about signs/symptoms and action to take for hypo/hyperglycemia.;Provide education about proper nutrition, including hydration, and aerobic/resistive exercise prescription along with prescribed medications to achieve blood glucose in normal ranges: Fasting glucose 65-99 mg/dL    Expected Outcomes Short Term: Participant verbalizes understanding of the signs/symptoms and immediate care of hyper/hypoglycemia, proper foot care and importance of medication, aerobic/resistive exercise and nutrition plan for blood glucose control.;Long Term: Attainment of HbA1C < 7%.  Hypertension Yes    Intervention Provide education on lifestyle modifcations including regular physical activity/exercise, weight management, moderate sodium restriction and increased consumption of fresh fruit, vegetables, and low fat dairy, alcohol moderation, and smoking cessation.;Monitor prescription use compliance.    Expected Outcomes Short Term: Continued assessment and intervention until BP is < 140/75m HG in hypertensive participants. < 130/828mHG in hypertensive participants with diabetes, heart failure or chronic kidney disease.;Long Term: Maintenance of blood pressure at goal levels.    Lipids Yes    Intervention Provide education and support for participant on nutrition & aerobic/resistive exercise along with prescribed medications to achieve LDL <7014mHDL >22m33m  Expected Outcomes Short Term: Participant states understanding of desired cholesterol values and is compliant with medications prescribed. Participant is following exercise  prescription and nutrition guidelines.;Long Term: Cholesterol controlled with medications as prescribed, with individualized exercise RX and with personalized nutrition plan. Value goals: LDL < 70mg44mL > 40 mg.           Education:Diabetes - Individual verbal and written instruction to review signs/symptoms of diabetes, desired ranges of glucose level fasting, after meals and with exercise. Acknowledge that pre and post exercise glucose checks will be done for 3 sessions at entry of program.   Cardiac Rehab from 11/30/2019 in [facility] and Pulmonary Rehab  Date 10/27/19  Educator [place]  ITroutvilletruction Review Code 1- Verbalizes Understanding      Education: Know Your Numbers and Risk Factors: -Group verbal and written instruction about important numbers in your health.  Discussion of what are risk factors and how they play a role in the disease process.  Review of Cholesterol, Blood Pressure, Diabetes, and BMI and the role they play in your overall health.   [facility]  Date 11/16/19  Educator SB  Instruction Review Code 1- Verbalizes Understanding      Core Components/Risk Factors/Patient Goals Review:    Core Components/Risk Factors/Patient Goals at Discharge (Final Review):    ITP Comments:  ITP Comments    Row Name 10/24/19 1138 10/27/19 1623 11/03/19 1403 11/16/19 0639 12/14/19 1627   ITP Comments Initial orientation completed. Diagnosis can be found in media tab. EP orienation scheduled for Thursday 7/22 at 2:30. Completed 6MWT and gym orientation. Initial ITP created and sent for review to Dr. Mark Emily Filbertical Director. First full day of exercise!  Patient was oriented to gym and equipment including functions, settings, policies, and procedures.  Patient's individual exercise prescription and treatment plan were reviewed.  All starting workloads were established based on the results of the 6 minute walk test done at  initial orientation visit.  The plan for exercise progression was also introduced and progression will be customized based on patient's performance and goals. 30 Day review completed. Medical Director ITP review done, changes made as directed, and signed approval by Medical Director. 30 day review completed. ITP sent to Dr. Mark Emily Filbertical Director of Cardiac and Pulmonary Rehab. Continue with ITP unless changes are made by physician.          Comments: 30 day review

## 2019-12-19 ENCOUNTER — Other Ambulatory Visit: Payer: Self-pay

## 2019-12-19 ENCOUNTER — Encounter: Payer: No Typology Code available for payment source | Admitting: *Deleted

## 2019-12-19 DIAGNOSIS — I21A1 Myocardial infarction type 2: Secondary | ICD-10-CM

## 2019-12-19 NOTE — Progress Notes (Signed)
Daily Session Note  Patient Details  Name: RENNE Keller MRN: [id number] Date of Birth: Jan 27, 1941 Referring Provider:     Cardiac Rehab from 10/27/2019 in [facility] Cardiac and Pulmonary Rehab  Referring Provider Chase Land, NP ([place])      Encounter Date: 12/19/2019  Check In:  Session Check In - 12/19/19 1429      Check-In   Supervising physician immediately available to respond to emergencies See telemetry face sheet for immediately available ER MD    Location [facility]-Cardiac & Pulmonary Rehab    Staff Present Chase Papa, RN Chase Auerbach, MS Exercise Physiologist;Chase Keller, BS, ACSM CEP, Exercise Physiologist    Virtual Visit No    Medication changes reported     No    Fall or balance concerns reported    No    Warm-up and Cool-down Performed on first and last piece of equipment    Resistance Training Performed Yes    VAD Patient? No    PAD/SET Patient? No      Pain Assessment   Currently in Pain? No/denies              Social History   Tobacco Use  Smoking Status Never Smoker  Smokeless Tobacco Never Used  Tobacco Comment   father smoked in the home while pt was growing up//ex-wife smoked    Goals Met:  Independence with exercise equipment Exercise tolerated well No report of cardiac concerns or symptoms Strength training completed today  Goals Unmet:  Not Applicable  Comments: Pt able to follow exercise prescription today without complaint.  Will continue to monitor for progression.    Dr. Emily Filbert is Medical Director for [place] and [facility].

## 2019-12-29 ENCOUNTER — Encounter: Payer: Self-pay | Admitting: *Deleted

## 2019-12-29 DIAGNOSIS — I21A1 Myocardial infarction type 2: Secondary | ICD-10-CM

## 2020-01-11 ENCOUNTER — Encounter: Payer: Self-pay | Admitting: *Deleted

## 2020-01-11 DIAGNOSIS — I21A1 Myocardial infarction type 2: Secondary | ICD-10-CM

## 2020-01-11 NOTE — Progress Notes (Signed)
Cardiac Individual Treatment Plan  Patient Details  Name: Chase Keller MRN: [id number] Date of Birth: 14-Jun-1940 Referring Provider:     Cardiac Rehab from 10/27/2019 in [facility] Cardiac and Pulmonary Rehab  Referring Provider Marylene Land, NP ([place])      Initial Encounter Date:    Cardiac Rehab from 10/27/2019 in [facility] Cardiac and Pulmonary Rehab  Date 10/27/19      Visit Diagnosis: Type 2 myocardial infarction [facility])  Patient's Home Medications on Admission:  Current Outpatient Medications:  .  albuterol (VENTOLIN HFA) 108 (90 Base) MCG/ACT inhaler, Inhale into the lungs., Disp: , Rfl:  .  allopurinol (ZYLOPRIM) 300 MG tablet, Take 300 mg by mouth daily. (Patient not taking: Reported on 10/24/2019), Disp: , Rfl:  .  amLODipine (NORVASC) 2.5 MG tablet, Take 2.5 mg by mouth as needed. If systolic BP over 737., Disp: , Rfl:  .  ammonium lactate (LAC-HYDRIN) 12 % lotion, Apply 1 application topically as needed for dry skin., Disp: , Rfl:  .  Ascorbic Acid (VITAMIN C) 1000 MG tablet, Take 1,000 mg by mouth daily. (Patient not taking: Reported on 10/24/2019), Disp: , Rfl:  .  aspirin EC 81 MG tablet, Take 81 mg by mouth daily. (Patient not taking: Reported on 10/24/2019), Disp: , Rfl:  .  Calcium Carb-Cholecalciferol (CALCIUM/VITAMIN D) 600-400 MG-UNIT TABS, Take by mouth., Disp: , Rfl:  .  carvedilol (COREG) 6.25 MG tablet, Take by mouth. (Patient not taking: Reported on 10/24/2019), Disp: , Rfl:  .  CELEBREX 200 MG capsule, Take 200 mg by mouth Daily. (Patient not taking: Reported on 10/24/2019), Disp: , Rfl:  .  Cholecalciferol (VITAMIN D3) 125 MCG (5000 UT) TABS, Take by mouth. (Patient not taking: Reported on 10/24/2019), Disp: , Rfl:  .  cyanocobalamin 1000 MCG tablet, Take 1,000 mcg by mouth daily. (Patient not taking: Reported on 10/24/2019), Disp: , Rfl:  .  cyclobenzaprine (FLEXERIL) 10 MG tablet, Take 10 mg by mouth 3 (three) times daily as needed for muscle spasms. (Patient not taking:  Reported on 10/24/2019), Disp: , Rfl:  .  docusate sodium (COLACE) 50 MG capsule, Take 50 mg by mouth 2 (two) times daily. (Patient not taking: Reported on 10/24/2019), Disp: , Rfl:  .  ezetimibe (ZETIA) 10 MG tablet, Take 10 mg by mouth daily., Disp: , Rfl:  .  ferrous fumarate-iron polysaccharide complex (TANDEM) 162-115.2 MG CAPS capsule, Take by mouth., Disp: , Rfl:  .  fluticasone (FLONASE) 50 MCG/ACT nasal spray, Place into the nose., Disp: , Rfl:  .  furosemide (LASIX) 20 MG tablet, Take by mouth. (Patient not taking: Reported on 10/24/2019), Disp: , Rfl:  .  glipiZIDE (GLUCOTROL) 5 MG tablet, Take 2.5 mg by mouth daily before breakfast. , Disp: , Rfl:  .  Hyaluronic Acid-Vitamin C (HYALURONIC ACID PO), Take 100 mg by mouth 2 (two) times daily. (Patient not taking: Reported on 10/24/2019), Disp: , Rfl:  .  HYDROcodone-acetaminophen (NORCO/VICODIN) 5-325 MG per tablet, Take 1 tablet by mouth every 6 (six) hours as needed. (Patient not taking: Reported on 10/24/2019), Disp: , Rfl:  .  labetalol (NORMODYNE) 100 MG tablet, Take 1 tablet (100 mg total) by mouth 2 (two) times daily. (Patient not taking: Reported on 10/24/2019), Disp: 180 tablet, Rfl: 3 .  labetalol (NORMODYNE) 100 MG tablet, TAKE 1 TABLET (100 MG TOTAL) BY MOUTH 2 (TWO) TIMES DAILY. (Patient not taking: Reported on 10/24/2019), Disp: 60 tablet, Rfl: 3 .  Melatonin 1 MG TABS, Take by mouth., Disp: ,  Rfl:  .  niacin 500 MG CR capsule, Take 500 mg by mouth at bedtime. (Patient not taking: Reported on 10/24/2019), Disp: , Rfl:  .  Potassium 99 MG TABS, Take 99 mg by mouth 2 (two) times daily.  (Patient not taking: Reported on 10/24/2019), Disp: , Rfl:  .  potassium gluconate 595 (99 K) MG TABS tablet, Take by mouth. (Patient not taking: Reported on 10/24/2019), Disp: , Rfl:  .  pyridOXINE (B-6) 50 MG tablet, Take 50 mg by mouth daily. (Patient not taking: Reported on 10/24/2019), Disp: , Rfl:  .  senna (SENOKOT) 8.6 MG tablet, Take 1 tablet by  mouth daily. (Patient not taking: Reported on 10/24/2019), Disp: , Rfl:  .  Skin Protectants, Misc. (HYDROCERIN EX), Apply topically., Disp: , Rfl:  .  spironolactone (ALDACTONE) 25 MG tablet, Take 25 mg by mouth daily., Disp: , Rfl:  .  tamsulosin (FLOMAX) 0.4 MG CAPS capsule, Take 0.4 mg by mouth at bedtime., Disp: , Rfl:  .  thiamine 100 MG tablet, Take 100 mg by mouth daily. (Patient not taking: Reported on 10/24/2019), Disp: , Rfl:  .  traMADol (ULTRAM) 50 MG tablet, Take by mouth. (Patient not taking: Reported on 10/24/2019), Disp: , Rfl:  .  trospium (SANCTURA) 20 MG tablet, Take 20 mg by mouth 2 (two) times daily., Disp: , Rfl:  .  UNABLE TO FIND, Vit B-1, B-2, B-6, B-12 (Patient not taking: Reported on 10/24/2019), Disp: , Rfl:  .  valsartan-hydrochlorothiazide (DIOVAN-HCT) 160-25 MG tablet, Take by mouth. (Patient not taking: Reported on 10/24/2019), Disp: , Rfl:  .  vitamin E 400 UNIT capsule, Take 400 Units by mouth daily. (Patient not taking: Reported on 10/24/2019), Disp: , Rfl:  .  warfarin (COUMADIN) 1 MG tablet, TAKE 3 TABLETS (3 MG TOTAL) BY MOUTH AS DIRECTED., Disp: , Rfl:   Past Medical History: Past Medical History:  Diagnosis Date  . Atrial fibrillation ([place]) 2013  . CHF (congestive heart failure) ([place])   . Chronic kidney disease    Kidney stone- passed  . Coronary artery disease   . Dysrhythmia    PA for Dr Noemi Chapel said yes  . Heart murmur    / 1 MD said yes, 1 said no  . History of blood transfusion    during CABG  . Hyperlipidemia   . Hypertension   . Left knee DJD   . MRSA (methicillin resistant staph aureus) culture positive    Drainage from Left Knee approx 2005  . Myocardial infarction ([place])    mild in 1998 per Wife   . PONV (postoperative nausea and vomiting)   . S/P coronary artery bypass graft x 2   . Shortness of breath   . Sleep apnea    CPAP  . Stented coronary artery     Tobacco Use: Social History   Tobacco Use  Smoking Status Never Smoker    Smokeless Tobacco Never Used  Tobacco Comment   father smoked in the home while pt was growing up//ex-wife smoked    Labs: Recent Review Scientist, physiological    Labs for ITP Cardiac and Pulmonary Rehab Latest Ref Rng & Units 03/28/2011 01/14/2012 02/25/2012   Cholestrol 0 - 200 mg/dL 179 158 -   LDLCALC 0 - 99 mg/dL 124(H) 110(H) -   HDL >39.00 mg/dL 26.00(L) 24.00(L) -   Trlycerides 0 - 149 mg/dL 146.0 122.0 -   Hemoglobin A1c <5.7 % - - 6.3(H)       Exercise Target  Goals: Exercise Program Goal: Individual exercise prescription set using results from initial 6 min walk test and THRR while considering  patient's activity barriers and safety.   Exercise Prescription Goal: Initial exercise prescription builds to 30-45 minutes a day of aerobic activity, 2-3 days per week.  Home exercise guidelines will be given to patient during program as part of exercise prescription that the participant will acknowledge.   Education: Aerobic Exercise & Resistance Training: - Gives group verbal and written instruction on the various components of exercise. Focuses on aerobic and resistive training programs and the benefits of this training and how to safely progress through these programs..   Cardiac Rehab from 11/30/2019 in [facility] Cardiac and Pulmonary Rehab  Date 11/30/19  [resistance]  Educator AS  Instruction Review Code 1- Verbalizes Understanding      Education: Exercise & Equipment Safety: - Individual verbal instruction and demonstration of equipment use and safety with use of the equipment.   Cardiac Rehab from 11/30/2019 in [facility] Cardiac and Pulmonary Rehab  Date 10/27/19  Educator [place]  Instruction Review Code 1- Verbalizes Understanding      Education: Exercise Physiology & General Exercise Guidelines: - Group verbal and written instruction with models to review the exercise physiology of the cardiovascular system and associated critical values. Provides general exercise guidelines with  specific guidelines to those with heart or lung disease.    Education: Flexibility, Balance, Mind/Body Relaxation: Provides group verbal/written instruction on the benefits of flexibility and balance training, including mind/body exercise modes such as yoga, pilates and tai chi.  Demonstration and skill practice provided.   Activity Barriers & Risk Stratification:  Activity Barriers & Cardiac Risk Stratification - 10/27/19 1645      Activity Barriers & Cardiac Risk Stratification   Activity Barriers Left Knee Replacement    Cardiac Risk Stratification Moderate           6 Minute Walk:  6 Minute Walk    Row Name 10/27/19 1649         6 Minute Walk   Phase Initial     Distance 997 feet     Walk Time 6 minutes     # of Rest Breaks 0     MPH 1.88     METS 1.61     RPE 9     Perceived Dyspnea  0     VO2 Peak 5.63     Symptoms No     Resting HR 58 bpm     Resting BP 134/66     Resting Oxygen Saturation  95 %     Exercise Oxygen Saturation  during 6 min walk 96 %     Max Ex. HR 94 bpm     Max Ex. BP 154/68     2 Minute Post BP 130/70            Oxygen Initial Assessment:   Oxygen Re-Evaluation:   Oxygen Discharge (Final Oxygen Re-Evaluation):   Initial Exercise Prescription:  Initial Exercise Prescription - 10/27/19 1600      Date of Initial Exercise RX and Referring Provider   Date 10/27/19    Referring Provider Marylene Land, NP ([place])      Treadmill   MPH 1.4    Grade 0    Minutes 15    METs 2.07      Recumbant Bike   Level 2    RPM 60    Watts 10    Minutes 15    METs  1.6      NuStep   Level 2    SPM 80    Minutes 15    METs 1.6      REL-XR   Level 2    Speed 50    Minutes 15    METs 1.6      Biostep-RELP   Level 2    SPM 50    Minutes 15    METs 1.6      Prescription Details   Frequency (times per week) 3    Duration Progress to 30 minutes of continuous aerobic without signs/symptoms of physical distress      Intensity    THRR 40-80% of Max Heartrate 91-125    Ratings of Perceived Exertion 11-13    Perceived Dyspnea 0-4      Progression   Progression Continue to progress workloads to maintain intensity without signs/symptoms of physical distress.      Resistance Training   Training Prescription Yes    Weight 3 lb    Reps 10-15           Perform Capillary Blood Glucose checks as needed.  Exercise Prescription Changes:  Exercise Prescription Changes    Row Name 10/27/19 1700 11/16/19 1300 11/30/19 1400 12/29/19 0800       Response to Exercise   Blood Pressure (Admit) 134/66 124/74 140/70 130/64    Blood Pressure (Exercise) 154/68 136/74 138/64 120/68    Blood Pressure (Exit) 130/70 120/64 130/66 104/64    Heart Rate (Admit) 58 bpm 57 bpm 80 bpm 70 bpm    Heart Rate (Exercise) 94 bpm 108 bpm 87 bpm 74 bpm    Heart Rate (Exit) 68 bpm 62 bpm 58 bpm 64 bpm    Oxygen Saturation (Admit) 95 % -- -- --    Oxygen Saturation (Exercise) 96 % -- -- --    Oxygen Saturation (Exit) 95 % -- -- --    Rating of Perceived Exertion (Exercise) 9 12 11 11     Perceived Dyspnea (Exercise) 0 -- -- --    Symptoms none none none none    Comments Walk Test Results -- -- --    Duration -- Continue with 30 min of aerobic exercise without signs/symptoms of physical distress. Continue with 30 min of aerobic exercise without signs/symptoms of physical distress. Continue with 30 min of aerobic exercise without signs/symptoms of physical distress.    Intensity -- THRR unchanged THRR unchanged THRR unchanged      Progression   Progression -- Continue to progress workloads to maintain intensity without signs/symptoms of physical distress. Continue to progress workloads to maintain intensity without signs/symptoms of physical distress. Continue to progress workloads to maintain intensity without signs/symptoms of physical distress.    Average METs -- 2.2 2.55 1.6      Resistance Training   Training Prescription Yes Yes Yes Yes     Weight 3 lb 3 lb 3 lb 3 lb    Reps 10-15 10-15 10-15 10-15      Interval Training   Interval Training -- No No No      Treadmill   MPH 1.4 1.5 1.5 --    Grade 0 0 0 --    Minutes 15 15 15  --    METs 2.07 2.07 2.15 --      Recumbant Bike   Level 2 -- -- --    RPM 60 -- -- --    Watts 10 -- -- --    Minutes 15 -- -- --  METs 1.6 -- -- --      NuStep   Level 2 -- 5 2    SPM 80 -- -- --    Minutes 15 -- 15 30    METs 1.6 -- 1.4 1.6      Arm Ergometer   Level -- -- 1.2 --    Minutes -- -- 15 --    METs -- -- 2.1 --      REL-XR   Level 2 1 -- --    Speed 50 50 -- --    Minutes 15 15 -- --    METs 1.6 2.3 -- --      Biostep-RELP   Level 2 -- -- --    SPM 50 -- -- --    Minutes 15 -- -- --    METs 1.6 -- -- --           Exercise Comments:   Exercise Goals and Review:  Exercise Goals    Row Name 10/27/19 1642             Exercise Goals   Increase Physical Activity Yes       Intervention Provide advice, education, support and counseling about physical activity/exercise needs.;Develop an individualized exercise prescription for aerobic and resistive training based on initial evaluation findings, risk stratification, comorbidities and participant's personal goals.       Expected Outcomes Short Term: Attend rehab on a regular basis to increase amount of physical activity.;Long Term: Add in home exercise to make exercise part of routine and to increase amount of physical activity.;Long Term: Exercising regularly at least 3-5 days a week.       Increase Strength and Stamina Yes       Intervention Provide advice, education, support and counseling about physical activity/exercise needs.;Develop an individualized exercise prescription for aerobic and resistive training based on initial evaluation findings, risk stratification, comorbidities and participant's personal goals.       Expected Outcomes Short Term: Increase workloads from initial exercise prescription for  resistance, speed, and METs.;Short Term: Perform resistance training exercises routinely during rehab and add in resistance training at home;Long Term: Improve cardiorespiratory fitness, muscular endurance and strength as measured by increased METs and functional capacity ( )       Able to understand and use rate of perceived exertion (RPE) scale Yes       Intervention Provide education and explanation on how to use RPE scale       Expected Outcomes Short Term: Able to use RPE daily in rehab to express subjective intensity level;Long Term:  Able to use RPE to guide intensity level when exercising independently       Able to understand and use Dyspnea scale Yes       Intervention Provide education and explanation on how to use Dyspnea scale       Expected Outcomes Short Term: Able to use Dyspnea scale daily in rehab to express subjective sense of shortness of breath during exertion;Long Term: Able to use Dyspnea scale to guide intensity level when exercising independently       Knowledge and understanding of Target Heart Rate Range (THRR) Yes       Intervention Provide education and explanation of THRR including how the numbers were predicted and where they are located for reference       Expected Outcomes Short Term: Able to state/look up THRR;Short Term: Able to use daily as guideline for intensity in rehab;Long Term: Able to use THRR to  govern intensity when exercising independently       Able to check pulse independently Yes       Intervention Provide education and demonstration on how to check pulse in carotid and radial arteries.;Review the importance of being able to check your own pulse for safety during independent exercise       Expected Outcomes Short Term: Able to explain why pulse checking is important during independent exercise;Long Term: Able to check pulse independently and accurately       Understanding of Exercise Prescription Yes       Intervention Provide education, explanation,  and written materials on patient's individual exercise prescription       Expected Outcomes Short Term: Able to explain program exercise prescription;Long Term: Able to explain home exercise prescription to exercise independently              Exercise Goals Re-Evaluation :  Exercise Goals Re-Evaluation    Row Name 11/03/19 1403 11/16/19 1344 11/30/19 1359 12/29/19 0812       Exercise Goal Re-Evaluation   Exercise Goals Review Increase Physical Activity;Able to understand and use rate of perceived exertion (RPE) scale;Knowledge and understanding of Target Heart Rate Range (THRR);Understanding of Exercise Prescription;Increase Strength and Stamina;Able to check pulse independently Increase Physical Activity;Increase Strength and Stamina Increase Physical Activity;Increase Strength and Stamina;Understanding of Exercise Prescription Increase Physical Activity;Increase Strength and Stamina;Understanding of Exercise Prescription    Comments Reviewed RPE and dyspnea scales, THR and program prescription with pt today.  Pt voiced understanding and was given a copy of goals to take home Markhi is tolerating exercise well so far.  He has increased speed on TM.  Staff will monitor progress. Kruz is doing well in rehab. His knee has been giving him some problems and he has started to wear a knee brace.  He says that it will occasionally flare up on him.  He was able to still do the NuStep but not the bike this week.  We will continue to monitor his progress. Currently out recovering from cataract surgery. Only attended once since last review.  Once cleared to return we will continue to monitor progress.    Expected Outcomes Short: Use RPE daily to regulate intensity. Long: Follow program prescription in THR. Short: attend consistently Long:  improve stamina and MET level Short: Allow knee to recover Long: Continue to improve stamina. Short: Return to regular attendance Long: Continue to improve stamina.            Discharge Exercise Prescription (Final Exercise Prescription Changes):  Exercise Prescription Changes - 12/29/19 0800      Response to Exercise   Blood Pressure (Admit) 130/64    Blood Pressure (Exercise) 120/68    Blood Pressure (Exit) 104/64    Heart Rate (Admit) 70 bpm    Heart Rate (Exercise) 74 bpm    Heart Rate (Exit) 64 bpm    Rating of Perceived Exertion (Exercise) 11    Symptoms none    Duration Continue with 30 min of aerobic exercise without signs/symptoms of physical distress.    Intensity THRR unchanged      Progression   Progression Continue to progress workloads to maintain intensity without signs/symptoms of physical distress.    Average METs 1.6      Resistance Training   Training Prescription Yes    Weight 3 lb    Reps 10-15      Interval Training   Interval Training No      NuStep  Level 2    Minutes 30    METs 1.6           Nutrition:  Target Goals: Understanding of nutrition guidelines, daily intake of sodium '1500mg'$ , cholesterol '200mg'$ , calories 30% from fat and 7% or less from saturated fats, daily to have 5 or more servings of fruits and vegetables.  Education: Controlling Sodium/Reading Food Labels -Group verbal and written material supporting the discussion of sodium use in heart healthy nutrition. Review and explanation with models, verbal and written materials for utilization of the food label.   Education: General Nutrition Guidelines/Fats and Fiber: -Group instruction provided by verbal, written material, models and posters to present the general guidelines for heart healthy nutrition. Gives an explanation and review of dietary fats and fiber.   Biometrics:  Pre Biometrics - 10/27/19 1642      Pre Biometrics   Height $Remov'5\' 6"'ckrnQD$  (1.676 m)    Weight 215 lb 4.8 oz (97.7 kg)    BMI (Calculated) 34.77    Single Leg Stand 13.4 seconds            Nutrition Therapy Plan and Nutrition Goals:  Nutrition Therapy & Goals - 11/21/19  1713      Nutrition Therapy   Diet Heart healthy, low Na, T2DM    Drug/Food Interactions Coumadin/Vit K    Protein (specify units) 80-85g    Fiber 30 grams    Whole Grain Foods 3 servings    Saturated Fats 12 max. grams    Fruits and Vegetables 5 servings/day    Sodium 1.5 grams      Personal Nutrition Goals   Nutrition Goal ST: increase vegetable intake, eat heart healthy fats LT: reduce knee pain    Comments t primary diagnosis for Cardiac Rehab is type 2 myocardial infarction. Pt also presents with HTN, CAD, CHF, A-fib, OSA with daytime narcolepsy, hypothyroidism, T2DM (6.3 not recent), HLD. Current Relevant Medications: vitamin C, Ca/vitamin D, vitamin D, colace, lasix, glipizide, potassium, senokot, vitamin E, coumadin. Shaquill doesn't like sweets and will eat smaller amounts of CHOs as well as baked chicken and fish, will aslo have fried chicken. Since Marko is on coumadin it is discouraged from taking omega-3 supplements, but can have some dietary sources such as fish, walnuts, and flaxseeds; pt is already eating fish. also suggested increasing fruit and vegetable intake as this could help with his inflammation. Discussed heart healthy eating and diabetes friendly eating. His wife's eating schedule dictates his; if she waits to eat lunch, he will as well and since she is not cooking he is going out to eat and eating more fried chicken. His wife reports he has an aversion to "anything green".      Intervention Plan   Intervention Prescribe, educate and counsel regarding individualized specific dietary modifications aiming towards targeted core components such as weight, hypertension, lipid management, diabetes, heart failure and other comorbidities.;Nutrition handout(s) given to patient.    Expected Outcomes Short Term Goal: Understand basic principles of dietary content, such as calories, fat, sodium, cholesterol and nutrients.;Short Term Goal: A plan has been developed with personal nutrition  goals set during dietitian appointment.;Long Term Goal: Adherence to prescribed nutrition plan.           Nutrition Assessments:  Nutrition Assessments - 10/27/19 1641      MEDFICTS Scores   Pre Score 53           MEDIFICTS Score Key:          ?70 Need  to make dietary changes          40-70 Heart Healthy Diet         ? 40 Therapeutic Level Cholesterol Diet  Nutrition Goals Re-Evaluation:   Nutrition Goals Discharge (Final Nutrition Goals Re-Evaluation):   Psychosocial: Target Goals: Acknowledge presence or absence of significant depression and/or stress, maximize coping skills, provide positive support system. Participant is able to verbalize types and ability to use techniques and skills needed for reducing stress and depression.   Education: Depression - Provides group verbal and written instruction on the correlation between heart/lung disease and depressed mood, treatment options, and the stigmas associated with seeking treatment.   Cardiac Rehab from 11/30/2019 in [facility] Cardiac and Pulmonary Rehab  Date 11/09/19  Educator SB  Instruction Review Code 1- [employer] Understanding      Education: Sleep Hygiene -Provides group verbal and written instruction about how sleep can affect your health.  Define sleep hygiene, discuss sleep cycles and impact of sleep habits. Review good sleep hygiene tips.     Education: Stress and Anxiety: - Provides group verbal and written instruction about the health risks of elevated stress and causes of high stress.  Discuss the correlation between heart/lung disease and anxiety and treatment options. Review healthy ways to manage with stress and anxiety.   Cardiac Rehab from 11/30/2019 in [facility] Cardiac and Pulmonary Rehab  Date 11/09/19  Educator SB  Instruction Review Code 1- Verbalizes Understanding       Initial Review & Psychosocial Screening:  Initial Psych Review & Screening - 10/24/19 1142      Initial Review   Current  issues with None Identified      Family Dynamics   Good Support System? Yes   wife     Barriers   Psychosocial barriers to participate in program There are no identifiable barriers or psychosocial needs.;The patient should benefit from training in stress management and relaxation.      Screening Interventions   Interventions Encouraged to exercise;To provide support and resources with identified psychosocial needs;Provide feedback about the scores to participant    Expected Outcomes Short Term goal: Utilizing psychosocial counselor, staff and physician to assist with identification of specific Stressors or current issues interfering with healing process. Setting desired goal for each stressor or current issue identified.;Short Term goal: Identification and review with participant of any Quality of Life or Depression concerns found by scoring the questionnaire.;Long Term Goal: Stressors or current issues are controlled or eliminated.;Long Term goal: The participant improves quality of Life and PHQ9 Scores as seen by post scores and/or verbalization of changes           Quality of Life Scores:   Quality of Life - 10/27/19 1641      Quality of Life   Select Quality of Life      Quality of Life Scores   Health/Function Pre 25.36 %    Socioeconomic Pre 30 %    Psych/Spiritual Pre 26.79 %    Family Pre 28.5 %    GLOBAL Pre 27.21 %          Scores of 19 and below usually indicate a poorer quality of life in these areas.  A difference of  2-3 points is a clinically meaningful difference.  A difference of 2-3 points in the total score of the Quality of Life Index has been associated with significant improvement in overall quality of life, self-image, physical symptoms, and general health in studies assessing change in quality  of life.  PHQ-9: Recent Review Flowsheet Data    Depression screen [facility] 2/9 10/27/2019 02/26/2012   Decreased Interest 0 0   Down, Depressed, Hopeless 0 0   PHQ - 2  Score 0 0   Altered sleeping 0 -   Tired, decreased energy 0 -   Change in appetite 0 -   Feeling bad or failure about yourself  0 -   Trouble concentrating 0 -   Moving slowly or fidgety/restless 0 -   Suicidal thoughts 0 -   PHQ-9 Score 0 -   Difficult doing work/chores Not difficult at all -     Interpretation of Total Score  Total Score Depression Severity:  1-4 = Minimal depression, 5-9 = Mild depression, 10-14 = Moderate depression, 15-19 = Moderately severe depression, 20-27 = Severe depression   Psychosocial Evaluation and Intervention:  Psychosocial Evaluation - 10/24/19 1156      Psychosocial Evaluation & Interventions   Comments Sachin has completed cardiac rehab before and his wife is currently enrolled. 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 He wants to get back to doing more around the house and outside. He reports sleeping and feeling well with minimal stress.    Expected Outcomes Short: attend cardiac rehab for exercise and education. Long: develop positive self care habits.    Continue Psychosocial Services  Follow up required by staff           Psychosocial Re-Evaluation:   Psychosocial Discharge (Final Psychosocial Re-Evaluation):   Vocational Rehabilitation: Provide vocational rehab assistance to qualifying candidates.   Vocational Rehab Evaluation & Intervention:  Vocational Rehab - 10/24/19 1136      Initial Vocational Rehab Evaluation & Intervention   Assessment shows need for Vocational Rehabilitation No           Education: Education Goals: Education classes will be provided on a variety of topics geared toward better understanding of heart health and risk factor modification. Participant will state understanding/return demonstration of topics presented as noted by education test scores.  Learning Barriers/Preferences:  Learning Barriers/Preferences - 10/24/19 1135      Learning Barriers/Preferences   Learning  Barriers None    Learning Preferences None           General Cardiac Education Topics:  AED/CPR: - Group verbal and written instruction with the use of models to demonstrate the basic use of the AED with the basic ABC's of resuscitation.   Anatomy & Physiology of the Heart: - Group verbal and written instruction and models provide basic cardiac anatomy and physiology, with the coronary electrical and arterial systems. Review of Valvular disease and Heart Failure   Cardiac Procedures: - Group verbal and written instruction to review commonly prescribed medications for heart disease. Reviews the medication, class of the drug, and side effects. Includes the steps to properly store meds and maintain the prescription regimen. (beta blockers and nitrates)   Cardiac Rehab from 11/30/2019 in [facility] Cardiac and Pulmonary Rehab  Date 11/30/19  Educator [facility]  Instruction Review Code 1- Verbalizes Understanding      Cardiac Medications I: - Group verbal and written instruction to review commonly prescribed medications for heart disease. Reviews the medication, class of the drug, and side effects. Includes the steps to properly store meds and maintain the prescription regimen.   Cardiac Medications II: -Group verbal and written instruction to review commonly prescribed medications for heart disease. Reviews the medication, class of the drug, and side effects. (  all other drug classes)   Cardiac Rehab from 11/30/2019 in [facility] Cardiac and Pulmonary Rehab  Date 11/16/19  Educator [place]  Instruction Review Code 1- Verbalizes Understanding       Go Sex-Intimacy & Heart Disease, Get SMART - Goal Setting: - Group verbal and written instruction through game format to discuss heart disease and the return to sexual intimacy. Provides group verbal and written material to discuss and apply goal setting through the application of the S.M.A.R.T. Method.   Cardiac Rehab from 11/30/2019 in [facility] Cardiac and  Pulmonary Rehab  Date 11/30/19  Educator [facility]  Instruction Review Code 1- Verbalizes Understanding      Other Matters of the Heart: - Provides group verbal, written materials and models to describe Stable Angina and Peripheral Artery. Includes description of the disease process and treatment options available to the cardiac patient.   Infection Prevention: - Provides verbal and written material to individual with discussion of infection control including proper hand washing and proper equipment cleaning during exercise session.   Cardiac Rehab from 11/30/2019 in [facility] Cardiac and Pulmonary Rehab  Date 10/27/19  Educator [place]  Instruction Review Code 1- Verbalizes Understanding      Falls Prevention: - Provides verbal and written material to individual with discussion of falls prevention and safety.   Cardiac Rehab from 11/30/2019 in [facility] Cardiac and Pulmonary Rehab  Date 10/27/19  Educator [place]  Instruction Review Code 1- Verbalizes Understanding      Other: -Provides group and verbal instruction on various topics (see comments)   Knowledge Questionnaire Score:  Knowledge Questionnaire Score - 10/27/19 1631      Knowledge Questionnaire Score   Pre Score 23/26: A & P, Exercise           Core Components/Risk Factors/Patient Goals at Admission:  Personal Goals and Risk Factors at Admission - 10/27/19 1644      Core Components/Risk Factors/Patient Goals on Admission    Weight Management Yes;Weight Loss    Intervention Weight Management: Develop a combined nutrition and exercise program designed to reach desired caloric intake, while maintaining appropriate intake of nutrient and fiber, sodium and fats, and appropriate energy expenditure required for the weight goal.;Weight Management: Provide education and appropriate resources to help participant work on and attain dietary goals.;Weight Management/Obesity: Establish reasonable short term and long term weight goals.    Admit  Weight 215 lb 4.8 oz (97.7 kg)    Goal Weight: Short Term 210 lb (95.3 kg)    Goal Weight: Long Term 205 lb (93 kg)    Expected Outcomes Short Term: Continue to assess and modify interventions until short term weight is achieved;Long Term: Adherence to nutrition and physical activity/exercise program aimed toward attainment of established weight goal;Weight Loss: Understanding of general recommendations for a balanced deficit meal plan, which promotes 1-2 lb weight loss per week and includes a negative energy balance of [id number] kcal/d;Understanding recommendations for meals to include 15-35% energy as protein, 25-35% energy from fat, 35-60% energy from carbohydrates, less than $RemoveB'200mg'rtXjlAzM$  of dietary cholesterol, 20-35 gm of total fiber daily;Understanding of distribution of calorie intake throughout the day with the consumption of 4-5 meals/snacks    Diabetes Yes    Intervention Provide education about signs/symptoms and action to take for hypo/hyperglycemia.;Provide education about proper nutrition, including hydration, and aerobic/resistive exercise prescription along with prescribed medications to achieve blood glucose in normal ranges: Fasting glucose 65-99 mg/dL    Expected Outcomes Short Term: Participant verbalizes understanding of the signs/symptoms and immediate  care of hyper/hypoglycemia, proper foot care and importance of medication, aerobic/resistive exercise and nutrition plan for blood glucose control.;Long Term: Attainment of HbA1C < 7%.    Hypertension Yes    Intervention Provide education on lifestyle modifcations including regular physical activity/exercise, weight management, moderate sodium restriction and increased consumption of fresh fruit, vegetables, and low fat dairy, alcohol moderation, and smoking cessation.;Monitor prescription use compliance.    Expected Outcomes Short Term: Continued assessment and intervention until BP is < 140/74mm HG in hypertensive participants. < 130/23mm HG  in hypertensive participants with diabetes, heart failure or chronic kidney disease.;Long Term: Maintenance of blood pressure at goal levels.    Lipids Yes    Intervention Provide education and support for participant on nutrition & aerobic/resistive exercise along with prescribed medications to achieve LDL '70mg'$ , HDL >$Remo'40mg'UaeQQ$ .    Expected Outcomes Short Term: Participant states understanding of desired cholesterol values and is compliant with medications prescribed. Participant is following exercise prescription and nutrition guidelines.;Long Term: Cholesterol controlled with medications as prescribed, with individualized exercise RX and with personalized nutrition plan. Value goals: LDL < $Rem'70mg'rdNw$ , HDL > 40 mg.           Education:Diabetes - Individual verbal and written instruction to review signs/symptoms of diabetes, desired ranges of glucose level fasting, after meals and with exercise. Acknowledge that pre and post exercise glucose checks will be done for 3 sessions at entry of program.   Cardiac Rehab from 11/30/2019 in [facility] Cardiac and Pulmonary Rehab  Date 10/27/19  Educator [place]  Instruction Review Code 1- Verbalizes Understanding      Education: Know Your Numbers and Risk Factors: -Group verbal and written instruction about important numbers in your health.  Discussion of what are risk factors and how they play a role in the disease process.  Review of Cholesterol, Blood Pressure, Diabetes, and BMI and the role they play in your overall health.   Cardiac Rehab from 11/30/2019 in [facility] Cardiac and Pulmonary Rehab  Date 11/16/19  Educator [place]  Instruction Review Code 1- Verbalizes Understanding      Core Components/Risk Factors/Patient Goals Review:    Core Components/Risk Factors/Patient Goals at Discharge (Final Review):    ITP Comments:  ITP Comments    Row Name 10/24/19 1138 10/27/19 1623 11/03/19 1403 11/16/19 0639 12/14/19 1627   ITP Comments Initial orientation completed.  Diagnosis can be found in media tab. EP orienation scheduled for Thursday 7/22 at 2:30. Completed 6MWT and gym orientation. Initial ITP created and sent for review to Dr. Emily Filbert, Medical Director. First full day of exercise!  Patient was oriented to gym and equipment including functions, settings, policies, and procedures.  Patient's individual exercise prescription and treatment plan were reviewed.  All starting workloads were established based on the results of the 6 minute walk test done at initial orientation visit.  The plan for exercise progression was also introduced and progression will be customized based on patient's performance and goals. 30 Day review completed. Medical Director ITP review done, changes made as directed, and signed approval by Medical Director. 30 day review completed. ITP sent to Dr. Emily Filbert, Medical Director of Cardiac and Pulmonary Rehab. Continue with ITP unless changes are made by physician.   [place] Name 12/29/19 3220 01/09/20 1721 01/11/20 0615       ITP Comments Currently out recovering from cataract surgery. Ferguson has been out since cataract surgery. 30 Day review completed. Medical Director ITP review done, changes made as directed, and signed approval by  Medical Director.            Comments:

## 2020-01-24 ENCOUNTER — Encounter: Payer: Self-pay | Admitting: *Deleted

## 2020-01-24 DIAGNOSIS — I21A1 Myocardial infarction type 2: Secondary | ICD-10-CM

## 2020-01-25 ENCOUNTER — Encounter: Payer: No Typology Code available for payment source | Attending: Cardiovascular Disease

## 2020-01-25 DIAGNOSIS — I21A1 Myocardial infarction type 2: Secondary | ICD-10-CM | POA: Insufficient documentation

## 2020-01-30 ENCOUNTER — Encounter: Payer: Self-pay | Admitting: *Deleted

## 2020-01-30 DIAGNOSIS — I21A1 Myocardial infarction type 2: Secondary | ICD-10-CM

## 2020-01-30 NOTE — Progress Notes (Signed)
Cardiac Individual Treatment Plan  Patient Details  Name: Chase Keller MRN: [id number] Date of Birth: 14-Jun-1940 Referring Provider:     Cardiac Rehab from 10/27/2019 in [facility] Cardiac and Pulmonary Rehab  Referring Provider Chase Land, NP ([place])      Initial Encounter Date:    Cardiac Rehab from 10/27/2019 in [facility] Cardiac and Pulmonary Rehab  Date 10/27/19      Visit Diagnosis: Type 2 myocardial infarction [facility])  Patient's Home Medications on Admission:  Current Outpatient Medications:  .  albuterol (VENTOLIN HFA) 108 (90 Base) MCG/ACT inhaler, Inhale into the lungs., Disp: , Rfl:  .  allopurinol (ZYLOPRIM) 300 MG tablet, Take 300 mg by mouth daily. (Patient not taking: Reported on 10/24/2019), Disp: , Rfl:  .  amLODipine (NORVASC) 2.5 MG tablet, Take 2.5 mg by mouth as needed. If systolic BP over 737., Disp: , Rfl:  .  ammonium lactate (LAC-HYDRIN) 12 % lotion, Apply 1 application topically as needed for dry skin., Disp: , Rfl:  .  Ascorbic Acid (VITAMIN C) 1000 MG tablet, Take 1,000 mg by mouth daily. (Patient not taking: Reported on 10/24/2019), Disp: , Rfl:  .  aspirin EC 81 MG tablet, Take 81 mg by mouth daily. (Patient not taking: Reported on 10/24/2019), Disp: , Rfl:  .  Calcium Carb-Cholecalciferol (CALCIUM/VITAMIN D) 600-400 MG-UNIT TABS, Take by mouth., Disp: , Rfl:  .  carvedilol (COREG) 6.25 MG tablet, Take by mouth. (Patient not taking: Reported on 10/24/2019), Disp: , Rfl:  .  CELEBREX 200 MG capsule, Take 200 mg by mouth Daily. (Patient not taking: Reported on 10/24/2019), Disp: , Rfl:  .  Cholecalciferol (VITAMIN D3) 125 MCG (5000 UT) TABS, Take by mouth. (Patient not taking: Reported on 10/24/2019), Disp: , Rfl:  .  cyanocobalamin 1000 MCG tablet, Take 1,000 mcg by mouth daily. (Patient not taking: Reported on 10/24/2019), Disp: , Rfl:  .  cyclobenzaprine (FLEXERIL) 10 MG tablet, Take 10 mg by mouth 3 (three) times daily as needed for muscle spasms. (Patient not taking:  Reported on 10/24/2019), Disp: , Rfl:  .  docusate sodium (COLACE) 50 MG capsule, Take 50 mg by mouth 2 (two) times daily. (Patient not taking: Reported on 10/24/2019), Disp: , Rfl:  .  ezetimibe (ZETIA) 10 MG tablet, Take 10 mg by mouth daily., Disp: , Rfl:  .  ferrous fumarate-iron polysaccharide complex (TANDEM) 162-115.2 MG CAPS capsule, Take by mouth., Disp: , Rfl:  .  fluticasone (FLONASE) 50 MCG/ACT nasal spray, Place into the nose., Disp: , Rfl:  .  furosemide (LASIX) 20 MG tablet, Take by mouth. (Patient not taking: Reported on 10/24/2019), Disp: , Rfl:  .  glipiZIDE (GLUCOTROL) 5 MG tablet, Take 2.5 mg by mouth daily before breakfast. , Disp: , Rfl:  .  Hyaluronic Acid-Vitamin C (HYALURONIC ACID PO), Take 100 mg by mouth 2 (two) times daily. (Patient not taking: Reported on 10/24/2019), Disp: , Rfl:  .  HYDROcodone-acetaminophen (NORCO/VICODIN) 5-325 MG per tablet, Take 1 tablet by mouth every 6 (six) hours as needed. (Patient not taking: Reported on 10/24/2019), Disp: , Rfl:  .  labetalol (NORMODYNE) 100 MG tablet, Take 1 tablet (100 mg total) by mouth 2 (two) times daily. (Patient not taking: Reported on 10/24/2019), Disp: 180 tablet, Rfl: 3 .  labetalol (NORMODYNE) 100 MG tablet, TAKE 1 TABLET (100 MG TOTAL) BY MOUTH 2 (TWO) TIMES DAILY. (Patient not taking: Reported on 10/24/2019), Disp: 60 tablet, Rfl: 3 .  Melatonin 1 MG TABS, Take by mouth., Disp: ,  Rfl:  .  niacin 500 MG CR capsule, Take 500 mg by mouth at bedtime. (Patient not taking: Reported on 10/24/2019), Disp: , Rfl:  .  Potassium 99 MG TABS, Take 99 mg by mouth 2 (two) times daily.  (Patient not taking: Reported on 10/24/2019), Disp: , Rfl:  .  potassium gluconate 595 (99 K) MG TABS tablet, Take by mouth. (Patient not taking: Reported on 10/24/2019), Disp: , Rfl:  .  pyridOXINE (B-6) 50 MG tablet, Take 50 mg by mouth daily. (Patient not taking: Reported on 10/24/2019), Disp: , Rfl:  .  senna (SENOKOT) 8.6 MG tablet, Take 1 tablet by  mouth daily. (Patient not taking: Reported on 10/24/2019), Disp: , Rfl:  .  Skin Protectants, Misc. (HYDROCERIN EX), Apply topically., Disp: , Rfl:  .  spironolactone (ALDACTONE) 25 MG tablet, Take 25 mg by mouth daily., Disp: , Rfl:  .  tamsulosin (FLOMAX) 0.4 MG CAPS capsule, Take 0.4 mg by mouth at bedtime., Disp: , Rfl:  .  thiamine 100 MG tablet, Take 100 mg by mouth daily. (Patient not taking: Reported on 10/24/2019), Disp: , Rfl:  .  traMADol (ULTRAM) 50 MG tablet, Take by mouth. (Patient not taking: Reported on 10/24/2019), Disp: , Rfl:  .  trospium (SANCTURA) 20 MG tablet, Take 20 mg by mouth 2 (two) times daily., Disp: , Rfl:  .  UNABLE TO FIND, Vit B-1, B-2, B-6, B-12 (Patient not taking: Reported on 10/24/2019), Disp: , Rfl:  .  valsartan-hydrochlorothiazide (DIOVAN-HCT) 160-25 MG tablet, Take by mouth. (Patient not taking: Reported on 10/24/2019), Disp: , Rfl:  .  vitamin E 400 UNIT capsule, Take 400 Units by mouth daily. (Patient not taking: Reported on 10/24/2019), Disp: , Rfl:  .  warfarin (COUMADIN) 1 MG tablet, TAKE 3 TABLETS (3 MG TOTAL) BY MOUTH AS DIRECTED., Disp: , Rfl:   Past Medical History: Past Medical History:  Diagnosis Date  . Atrial fibrillation ([place]) 2013  . CHF (congestive heart failure) ([place])   . Chronic kidney disease    Kidney stone- passed  . Coronary artery disease   . Dysrhythmia    PA for Dr Noemi Chapel said yes  . Heart murmur    / 1 MD said yes, 1 said no  . History of blood transfusion    during CABG  . Hyperlipidemia   . Hypertension   . Left knee DJD   . MRSA (methicillin resistant staph aureus) culture positive    Drainage from Left Knee approx 2005  . Myocardial infarction ([place])    mild in 1998 per Wife   . PONV (postoperative nausea and vomiting)   . S/P coronary artery bypass graft x 2   . Shortness of breath   . Sleep apnea    CPAP  . Stented coronary artery     Tobacco Use: Social History   Tobacco Use  Smoking Status Never Smoker    Smokeless Tobacco Never Used  Tobacco Comment   father smoked in the home while pt was growing up//ex-wife smoked    Labs: Recent Review Scientist, physiological    Labs for ITP Cardiac and Pulmonary Rehab Latest Ref Rng & Units 03/28/2011 01/14/2012 02/25/2012   Cholestrol 0 - 200 mg/dL 179 158 -   LDLCALC 0 - 99 mg/dL 124(H) 110(H) -   HDL >39.00 mg/dL 26.00(L) 24.00(L) -   Trlycerides 0 - 149 mg/dL 146.0 122.0 -   Hemoglobin A1c <5.7 % - - 6.3(H)       Exercise Target  Goals: Exercise Program Goal: Individual exercise prescription set using results from initial 6 min walk test and THRR while considering  patient's activity barriers and safety.   Exercise Prescription Goal: Initial exercise prescription builds to 30-45 minutes a day of aerobic activity, 2-3 days per week.  Home exercise guidelines will be given to patient during program as part of exercise prescription that the participant will acknowledge.   Education: Aerobic Exercise & Resistance Training: - Gives group verbal and written instruction on the various components of exercise. Focuses on aerobic and resistive training programs and the benefits of this training and how to safely progress through these programs..   Cardiac Rehab from 11/30/2019 in [facility] Cardiac and Pulmonary Rehab  Date 11/30/19  [resistance]  Educator AS  Instruction Review Code 1- Verbalizes Understanding      Education: Exercise & Equipment Safety: - Individual verbal instruction and demonstration of equipment use and safety with use of the equipment.   Cardiac Rehab from 11/30/2019 in [facility] Cardiac and Pulmonary Rehab  Date 10/27/19  Educator [place]  Instruction Review Code 1- Verbalizes Understanding      Education: Exercise Physiology & General Exercise Guidelines: - Group verbal and written instruction with models to review the exercise physiology of the cardiovascular system and associated critical values. Provides general exercise guidelines with  specific guidelines to those with heart or lung disease.    Education: Flexibility, Balance, Mind/Body Relaxation: Provides group verbal/written instruction on the benefits of flexibility and balance training, including mind/body exercise modes such as yoga, pilates and tai chi.  Demonstration and skill practice provided.   Activity Barriers & Risk Stratification:  Activity Barriers & Cardiac Risk Stratification - 10/27/19 1645      Activity Barriers & Cardiac Risk Stratification   Activity Barriers Left Knee Replacement    Cardiac Risk Stratification Moderate           6 Minute Walk:  6 Minute Walk    Row Name 10/27/19 1649         6 Minute Walk   Phase Initial     Distance 997 feet     Walk Time 6 minutes     # of Rest Breaks 0     MPH 1.88     METS 1.61     RPE 9     Perceived Dyspnea  0     VO2 Peak 5.63     Symptoms No     Resting HR 58 bpm     Resting BP 134/66     Resting Oxygen Saturation  95 %     Exercise Oxygen Saturation  during 6 min walk 96 %     Max Ex. HR 94 bpm     Max Ex. BP 154/68     2 Minute Post BP 130/70            Oxygen Initial Assessment:   Oxygen Re-Evaluation:   Oxygen Discharge (Final Oxygen Re-Evaluation):   Initial Exercise Prescription:  Initial Exercise Prescription - 10/27/19 1600      Date of Initial Exercise RX and Referring Provider   Date 10/27/19    Referring Provider Chase Land, NP ([place])      Treadmill   MPH 1.4    Grade 0    Minutes 15    METs 2.07      Recumbant Bike   Level 2    RPM 60    Watts 10    Minutes 15    METs  1.6      NuStep   Level 2    SPM 80    Minutes 15    METs 1.6      REL-XR   Level 2    Speed 50    Minutes 15    METs 1.6      Biostep-RELP   Level 2    SPM 50    Minutes 15    METs 1.6      Prescription Details   Frequency (times per week) 3    Duration Progress to 30 minutes of continuous aerobic without signs/symptoms of physical distress      Intensity    THRR 40-80% of Max Heartrate 91-125    Ratings of Perceived Exertion 11-13    Perceived Dyspnea 0-4      Progression   Progression Continue to progress workloads to maintain intensity without signs/symptoms of physical distress.      Resistance Training   Training Prescription Yes    Weight 3 lb    Reps 10-15           Perform Capillary Blood Glucose checks as needed.  Exercise Prescription Changes:  Exercise Prescription Changes    Row Name 10/27/19 1700 11/16/19 1300 11/30/19 1400 12/29/19 0800       Response to Exercise   Blood Pressure (Admit) 134/66 124/74 140/70 130/64    Blood Pressure (Exercise) 154/68 136/74 138/64 120/68    Blood Pressure (Exit) 130/70 120/64 130/66 104/64    Heart Rate (Admit) 58 bpm 57 bpm 80 bpm 70 bpm    Heart Rate (Exercise) 94 bpm 108 bpm 87 bpm 74 bpm    Heart Rate (Exit) 68 bpm 62 bpm 58 bpm 64 bpm    Oxygen Saturation (Admit) 95 % -- -- --    Oxygen Saturation (Exercise) 96 % -- -- --    Oxygen Saturation (Exit) 95 % -- -- --    Rating of Perceived Exertion (Exercise) 9 12 11 11     Perceived Dyspnea (Exercise) 0 -- -- --    Symptoms none none none none    Comments Walk Test Results -- -- --    Duration -- Continue with 30 min of aerobic exercise without signs/symptoms of physical distress. Continue with 30 min of aerobic exercise without signs/symptoms of physical distress. Continue with 30 min of aerobic exercise without signs/symptoms of physical distress.    Intensity -- THRR unchanged THRR unchanged THRR unchanged      Progression   Progression -- Continue to progress workloads to maintain intensity without signs/symptoms of physical distress. Continue to progress workloads to maintain intensity without signs/symptoms of physical distress. Continue to progress workloads to maintain intensity without signs/symptoms of physical distress.    Average METs -- 2.2 2.55 1.6      Resistance Training   Training Prescription Yes Yes Yes Yes     Weight 3 lb 3 lb 3 lb 3 lb    Reps 10-15 10-15 10-15 10-15      Interval Training   Interval Training -- No No No      Treadmill   MPH 1.4 1.5 1.5 --    Grade 0 0 0 --    Minutes 15 15 15  --    METs 2.07 2.07 2.15 --      Recumbant Bike   Level 2 -- -- --    RPM 60 -- -- --    Watts 10 -- -- --    Minutes 15 -- -- --  METs 1.6 -- -- --      NuStep   Level 2 -- 5 2    SPM 80 -- -- --    Minutes 15 -- 15 30    METs 1.6 -- 1.4 1.6      Arm Ergometer   Level -- -- 1.2 --    Minutes -- -- 15 --    METs -- -- 2.1 --      REL-XR   Level 2 1 -- --    Speed 50 50 -- --    Minutes 15 15 -- --    METs 1.6 2.3 -- --      Biostep-RELP   Level 2 -- -- --    SPM 50 -- -- --    Minutes 15 -- -- --    METs 1.6 -- -- --           Exercise Comments:   Exercise Goals and Review:  Exercise Goals    Row Name 10/27/19 1642             Exercise Goals   Increase Physical Activity Yes       Intervention Provide advice, education, support and counseling about physical activity/exercise needs.;Develop an individualized exercise prescription for aerobic and resistive training based on initial evaluation findings, risk stratification, comorbidities and participant's personal goals.       Expected Outcomes Short Term: Attend rehab on a regular basis to increase amount of physical activity.;Long Term: Add in home exercise to make exercise part of routine and to increase amount of physical activity.;Long Term: Exercising regularly at least 3-5 days a week.       Increase Strength and Stamina Yes       Intervention Provide advice, education, support and counseling about physical activity/exercise needs.;Develop an individualized exercise prescription for aerobic and resistive training based on initial evaluation findings, risk stratification, comorbidities and participant's personal goals.       Expected Outcomes Short Term: Increase workloads from initial exercise prescription for  resistance, speed, and METs.;Short Term: Perform resistance training exercises routinely during rehab and add in resistance training at home;Long Term: Improve cardiorespiratory fitness, muscular endurance and strength as measured by increased METs and functional capacity (6MWT)       Able to understand and use rate of perceived exertion (RPE) scale Yes       Intervention Provide education and explanation on how to use RPE scale       Expected Outcomes Short Term: Able to use RPE daily in rehab to express subjective intensity level;Long Term:  Able to use RPE to guide intensity level when exercising independently       Able to understand and use Dyspnea scale Yes       Intervention Provide education and explanation on how to use Dyspnea scale       Expected Outcomes Short Term: Able to use Dyspnea scale daily in rehab to express subjective sense of shortness of breath during exertion;Long Term: Able to use Dyspnea scale to guide intensity level when exercising independently       Knowledge and understanding of Target Heart Rate Range (THRR) Yes       Intervention Provide education and explanation of THRR including how the numbers were predicted and where they are located for reference       Expected Outcomes Short Term: Able to state/look up THRR;Short Term: Able to use daily as guideline for intensity in rehab;Long Term: Able to use THRR to  govern intensity when exercising independently       Able to check pulse independently Yes       Intervention Provide education and demonstration on how to check pulse in carotid and radial arteries.;Review the importance of being able to check your own pulse for safety during independent exercise       Expected Outcomes Short Term: Able to explain why pulse checking is important during independent exercise;Long Term: Able to check pulse independently and accurately       Understanding of Exercise Prescription Yes       Intervention Provide education, explanation,  and written materials on patient's individual exercise prescription       Expected Outcomes Short Term: Able to explain program exercise prescription;Long Term: Able to explain home exercise prescription to exercise independently              Exercise Goals Re-Evaluation :  Exercise Goals Re-Evaluation    Row Name 11/03/19 1403 11/16/19 1344 11/30/19 1359 12/29/19 0812 01/24/20 0843     Exercise Goal Re-Evaluation   Exercise Goals Review Increase Physical Activity;Able to understand and use rate of perceived exertion (RPE) scale;Knowledge and understanding of Target Heart Rate Range (THRR);Understanding of Exercise Prescription;Increase Strength and Stamina;Able to check pulse independently Increase Physical Activity;Increase Strength and Stamina Increase Physical Activity;Increase Strength and Stamina;Understanding of Exercise Prescription Increase Physical Activity;Increase Strength and Stamina;Understanding of Exercise Prescription --   Comments Reviewed RPE and dyspnea scales, THR and program prescription with pt today.  Pt voiced understanding and was given a copy of goals to take home Chase Keller is tolerating exercise well so far.  He has increased speed on TM.  Staff will monitor progress. Chase Keller is doing well in rehab. His knee has been giving him some problems and he has started to wear a knee brace.  He says that it will occasionally flare up on him.  He was able to still do the NuStep but not the bike this week.  We will continue to monitor his progress. Currently out recovering from cataract surgery. Only attended once since last review.  Once cleared to return we will continue to monitor progress. Out since last review.   Expected Outcomes Short: Use RPE daily to regulate intensity. Long: Follow program prescription in THR. Short: attend consistently Long:  improve stamina and MET level Short: Allow knee to recover Long: Continue to improve stamina. Short: Return to regular attendance Long:  Continue to improve stamina. --          Discharge Exercise Prescription (Final Exercise Prescription Changes):  Exercise Prescription Changes - 12/29/19 0800      Response to Exercise   Blood Pressure (Admit) 130/64    Blood Pressure (Exercise) 120/68    Blood Pressure (Exit) 104/64    Heart Rate (Admit) 70 bpm    Heart Rate (Exercise) 74 bpm    Heart Rate (Exit) 64 bpm    Rating of Perceived Exertion (Exercise) 11    Symptoms none    Duration Continue with 30 min of aerobic exercise without signs/symptoms of physical distress.    Intensity THRR unchanged      Progression   Progression Continue to progress workloads to maintain intensity without signs/symptoms of physical distress.    Average METs 1.6      Resistance Training   Training Prescription Yes    Weight 3 lb    Reps 10-15      Interval Training   Interval Training No  NuStep   Level 2    Minutes 30    METs 1.6           Nutrition:  Target Goals: Understanding of nutrition guidelines, daily intake of sodium '1500mg'$ , cholesterol '200mg'$ , calories 30% from fat and 7% or less from saturated fats, daily to have 5 or more servings of fruits and vegetables.  Education: Controlling Sodium/Reading Food Labels -Group verbal and written material supporting the discussion of sodium use in heart healthy nutrition. Review and explanation with models, verbal and written materials for utilization of the food label.   Education: General Nutrition Guidelines/Fats and Fiber: -Group instruction provided by verbal, written material, models and posters to present the general guidelines for heart healthy nutrition. Gives an explanation and review of dietary fats and fiber.   Biometrics:  Pre Biometrics - 10/27/19 1642      Pre Biometrics   Height $Remov'5\' 6"'uJtSzG$  (1.676 m)    Weight 215 lb 4.8 oz (97.7 kg)    BMI (Calculated) 34.77    Single Leg Stand 13.4 seconds            Nutrition Therapy Plan and Nutrition Goals:   Nutrition Therapy & Goals - 11/21/19 1713      Nutrition Therapy   Diet Heart healthy, low Na, T2DM    Drug/Food Interactions Coumadin/Vit K    Protein (specify units) 80-85g    Fiber 30 grams    Whole Grain Foods 3 servings    Saturated Fats 12 max. grams    Fruits and Vegetables 5 servings/day    Sodium 1.5 grams      Personal Nutrition Goals   Nutrition Goal ST: increase vegetable intake, eat heart healthy fats LT: reduce knee pain    Comments t primary diagnosis for Cardiac Rehab is type 2 myocardial infarction. Pt also presents with HTN, CAD, CHF, A-fib, OSA with daytime narcolepsy, hypothyroidism, T2DM (6.3 not recent), HLD. Current Relevant Medications: vitamin C, Ca/vitamin D, vitamin D, colace, lasix, glipizide, potassium, senokot, vitamin E, coumadin. Chase Keller doesn't like sweets and will eat smaller amounts of CHOs as well as baked chicken and fish, will aslo have fried chicken. Since Chase Keller is on coumadin it is discouraged from taking omega-3 supplements, but can have some dietary sources such as fish, walnuts, and flaxseeds; pt is already eating fish. also suggested increasing fruit and vegetable intake as this could help with his inflammation. Discussed heart healthy eating and diabetes friendly eating. His wife's eating schedule dictates his; if she waits to eat lunch, he will as well and since she is not cooking he is going out to eat and eating more fried chicken. His wife reports he has an aversion to "anything green".      Intervention Plan   Intervention Prescribe, educate and counsel regarding individualized specific dietary modifications aiming towards targeted core components such as weight, hypertension, lipid management, diabetes, heart failure and other comorbidities.;Nutrition handout(s) given to patient.    Expected Outcomes Short Term Goal: Understand basic principles of dietary content, such as calories, fat, sodium, cholesterol and nutrients.;Short Term Goal: A plan has  been developed with personal nutrition goals set during dietitian appointment.;Long Term Goal: Adherence to prescribed nutrition plan.           Nutrition Assessments:  Nutrition Assessments - 10/27/19 1641      MEDFICTS Scores   Pre Score 53           MEDIFICTS Score Key:          ?  70 Need to make dietary changes          40-70 Heart Healthy Diet         ? 40 Therapeutic Level Cholesterol Diet  Nutrition Goals Re-Evaluation:   Nutrition Goals Discharge (Final Nutrition Goals Re-Evaluation):   Psychosocial: Target Goals: Acknowledge presence or absence of significant depression and/or stress, maximize coping skills, provide positive support system. Participant is able to verbalize types and ability to use techniques and skills needed for reducing stress and depression.   Education: Depression - Provides group verbal and written instruction on the correlation between heart/lung disease and depressed mood, treatment options, and the stigmas associated with seeking treatment.   Cardiac Rehab from 11/30/2019 in [facility] Cardiac and Pulmonary Rehab  Date 11/09/19  Educator SB  Instruction Review Code 1- [employer] Understanding      Education: Sleep Hygiene -Provides group verbal and written instruction about how sleep can affect your health.  Define sleep hygiene, discuss sleep cycles and impact of sleep habits. Review good sleep hygiene tips.     Education: Stress and Anxiety: - Provides group verbal and written instruction about the health risks of elevated stress and causes of high stress.  Discuss the correlation between heart/lung disease and anxiety and treatment options. Review healthy ways to manage with stress and anxiety.   Cardiac Rehab from 11/30/2019 in [facility] Cardiac and Pulmonary Rehab  Date 11/09/19  Educator SB  Instruction Review Code 1- Verbalizes Understanding       Initial Review & Psychosocial Screening:  Initial Psych Review & Screening - 10/24/19  1142      Initial Review   Current issues with None Identified      Family Dynamics   Good Support System? Yes   wife     Barriers   Psychosocial barriers to participate in program There are no identifiable barriers or psychosocial needs.;The patient should benefit from training in stress management and relaxation.      Screening Interventions   Interventions Encouraged to exercise;To provide support and resources with identified psychosocial needs;Provide feedback about the scores to participant    Expected Outcomes Short Term goal: Utilizing psychosocial counselor, staff and physician to assist with identification of specific Stressors or current issues interfering with healing process. Setting desired goal for each stressor or current issue identified.;Short Term goal: Identification and review with participant of any Quality of Life or Depression concerns found by scoring the questionnaire.;Long Term Goal: Stressors or current issues are controlled or eliminated.;Long Term goal: The participant improves quality of Life and PHQ9 Scores as seen by post scores and/or verbalization of changes           Quality of Life Scores:   Quality of Life - 10/27/19 1641      Quality of Life   Select Quality of Life      Quality of Life Scores   Health/Function Pre 25.36 %    Socioeconomic Pre 30 %    Psych/Spiritual Pre 26.79 %    Family Pre 28.5 %    GLOBAL Pre 27.21 %          Scores of 19 and below usually indicate a poorer quality of life in these areas.  A difference of  2-3 points is a clinically meaningful difference.  A difference of 2-3 points in the total score of the Quality of Life Index has been associated with significant improvement in overall quality of life, self-image, physical symptoms, and general health in studies assessing change  in quality of life.  PHQ-9: Recent Review Flowsheet Data    Depression screen [facility] 2/9 10/27/2019 02/26/2012   Decreased Interest 0 0    Down, Depressed, Hopeless 0 0   PHQ - 2 Score 0 0   Altered sleeping 0 -   Tired, decreased energy 0 -   Change in appetite 0 -   Feeling bad or failure about yourself  0 -   Trouble concentrating 0 -   Moving slowly or fidgety/restless 0 -   Suicidal thoughts 0 -   PHQ-9 Score 0 -   Difficult doing work/chores Not difficult at all -     Interpretation of Total Score  Total Score Depression Severity:  1-4 = Minimal depression, 5-9 = Mild depression, 10-14 = Moderate depression, 15-19 = Moderately severe depression, 20-27 = Severe depression   Psychosocial Evaluation and Intervention:  Psychosocial Evaluation - 10/24/19 1156      Psychosocial Evaluation & Interventions   Comments Chase Keller has completed cardiac rehab before and his wife is currently enrolled. 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 He wants to get back to doing more around the house and outside. He reports sleeping and feeling well with minimal stress.    Expected Outcomes Short: attend cardiac rehab for exercise and education. Long: develop positive self care habits.    Continue Psychosocial Services  Follow up required by staff           Psychosocial Re-Evaluation:   Psychosocial Discharge (Final Psychosocial Re-Evaluation):   Vocational Rehabilitation: Provide vocational rehab assistance to qualifying candidates.   Vocational Rehab Evaluation & Intervention:  Vocational Rehab - 10/24/19 1136      Initial Vocational Rehab Evaluation & Intervention   Assessment shows need for Vocational Rehabilitation No           Education: Education Goals: Education classes will be provided on a variety of topics geared toward better understanding of heart health and risk factor modification. Participant will state understanding/return demonstration of topics presented as noted by education test scores.  Learning Barriers/Preferences:  Learning Barriers/Preferences - 10/24/19 1135       Learning Barriers/Preferences   Learning Barriers None    Learning Preferences None           General Cardiac Education Topics:  AED/CPR: - Group verbal and written instruction with the use of models to demonstrate the basic use of the AED with the basic ABC's of resuscitation.   Anatomy & Physiology of the Heart: - Group verbal and written instruction and models provide basic cardiac anatomy and physiology, with the coronary electrical and arterial systems. Review of Valvular disease and Heart Failure   Cardiac Procedures: - Group verbal and written instruction to review commonly prescribed medications for heart disease. Reviews the medication, class of the drug, and side effects. Includes the steps to properly store meds and maintain the prescription regimen. (beta blockers and nitrates)   Cardiac Rehab from 11/30/2019 in [facility] Cardiac and Pulmonary Rehab  Date 11/30/19  Educator [facility]  Instruction Review Code 1- Verbalizes Understanding      Cardiac Medications I: - Group verbal and written instruction to review commonly prescribed medications for heart disease. Reviews the medication, class of the drug, and side effects. Includes the steps to properly store meds and maintain the prescription regimen.   Cardiac Medications II: -Group verbal and written instruction to review commonly prescribed medications for heart disease. Reviews the medication, class of the drug, and  side effects. (all other drug classes)   Cardiac Rehab from 11/30/2019 in [facility] Cardiac and Pulmonary Rehab  Date 11/16/19  Educator [place]  Instruction Review Code 1- Verbalizes Understanding       Go Sex-Intimacy & Heart Disease, Get SMART - Goal Setting: - Group verbal and written instruction through game format to discuss heart disease and the return to sexual intimacy. Provides group verbal and written material to discuss and apply goal setting through the application of the S.M.A.R.T. Method.   Cardiac Rehab  from 11/30/2019 in [facility] Cardiac and Pulmonary Rehab  Date 11/30/19  Educator [facility]  Instruction Review Code 1- Verbalizes Understanding      Other Matters of the Heart: - Provides group verbal, written materials and models to describe Stable Angina and Peripheral Artery. Includes description of the disease process and treatment options available to the cardiac patient.   Infection Prevention: - Provides verbal and written material to individual with discussion of infection control including proper hand washing and proper equipment cleaning during exercise session.   Cardiac Rehab from 11/30/2019 in [facility] Cardiac and Pulmonary Rehab  Date 10/27/19  Educator [place]  Instruction Review Code 1- Verbalizes Understanding      Falls Prevention: - Provides verbal and written material to individual with discussion of falls prevention and safety.   Cardiac Rehab from 11/30/2019 in [facility] Cardiac and Pulmonary Rehab  Date 10/27/19  Educator [place]  Instruction Review Code 1- Verbalizes Understanding      Other: -Provides group and verbal instruction on various topics (see comments)   Knowledge Questionnaire Score:  Knowledge Questionnaire Score - 10/27/19 1631      Knowledge Questionnaire Score   Pre Score 23/26: A & P, Exercise           Core Components/Risk Factors/Patient Goals at Admission:  Personal Goals and Risk Factors at Admission - 10/27/19 1644      Core Components/Risk Factors/Patient Goals on Admission    Weight Management Yes;Weight Loss    Intervention Weight Management: Develop a combined nutrition and exercise program designed to reach desired caloric intake, while maintaining appropriate intake of nutrient and fiber, sodium and fats, and appropriate energy expenditure required for the weight goal.;Weight Management: Provide education and appropriate resources to help participant work on and attain dietary goals.;Weight Management/Obesity: Establish reasonable short term and long  term weight goals.    Admit Weight 215 lb 4.8 oz (97.7 kg)    Goal Weight: Short Term 210 lb (95.3 kg)    Goal Weight: Long Term 205 lb (93 kg)    Expected Outcomes Short Term: Continue to assess and modify interventions until short term weight is achieved;Long Term: Adherence to nutrition and physical activity/exercise program aimed toward attainment of established weight goal;Weight Loss: Understanding of general recommendations for a balanced deficit meal plan, which promotes 1-2 lb weight loss per week and includes a negative energy balance of [phone] kcal/d;Understanding recommendations for meals to include 15-35% energy as protein, 25-35% energy from fat, 35-60% energy from carbohydrates, less than $RemoveB'200mg'LykuIEVb$  of dietary cholesterol, 20-35 gm of total fiber daily;Understanding of distribution of calorie intake throughout the day with the consumption of 4-5 meals/snacks    Diabetes Yes    Intervention Provide education about signs/symptoms and action to take for hypo/hyperglycemia.;Provide education about proper nutrition, including hydration, and aerobic/resistive exercise prescription along with prescribed medications to achieve blood glucose in normal ranges: Fasting glucose 65-99 mg/dL    Expected Outcomes Short Term: Participant verbalizes understanding of the signs/symptoms  and immediate care of hyper/hypoglycemia, proper foot care and importance of medication, aerobic/resistive exercise and nutrition plan for blood glucose control.;Long Term: Attainment of HbA1C < 7%.    Hypertension Yes    Intervention Provide education on lifestyle modifcations including regular physical activity/exercise, weight management, moderate sodium restriction and increased consumption of fresh fruit, vegetables, and low fat dairy, alcohol moderation, and smoking cessation.;Monitor prescription use compliance.    Expected Outcomes Short Term: Continued assessment and intervention until BP is < 140/99mm HG in hypertensive  participants. < 130/43mm HG in hypertensive participants with diabetes, heart failure or chronic kidney disease.;Long Term: Maintenance of blood pressure at goal levels.    Lipids Yes    Intervention Provide education and support for participant on nutrition & aerobic/resistive exercise along with prescribed medications to achieve LDL '70mg'$ , HDL >$Remo'40mg'htZsb$ .    Expected Outcomes Short Term: Participant states understanding of desired cholesterol values and is compliant with medications prescribed. Participant is following exercise prescription and nutrition guidelines.;Long Term: Cholesterol controlled with medications as prescribed, with individualized exercise RX and with personalized nutrition plan. Value goals: LDL < $Rem'70mg'DDXs$ , HDL > 40 mg.           Education:Diabetes - Individual verbal and written instruction to review signs/symptoms of diabetes, desired ranges of glucose level fasting, after meals and with exercise. Acknowledge that pre and post exercise glucose checks will be done for 3 sessions at entry of program.   Cardiac Rehab from 11/30/2019 in [facility] Cardiac and Pulmonary Rehab  Date 10/27/19  Educator [place]  Instruction Review Code 1- Verbalizes Understanding      Education: Know Your Numbers and Risk Factors: -Group verbal and written instruction about important numbers in your health.  Discussion of what are risk factors and how they play a role in the disease process.  Review of Cholesterol, Blood Pressure, Diabetes, and BMI and the role they play in your overall health.   Cardiac Rehab from 11/30/2019 in [facility] Cardiac and Pulmonary Rehab  Date 11/16/19  Educator [place]  Instruction Review Code 1- Verbalizes Understanding      Core Components/Risk Factors/Patient Goals Review:    Core Components/Risk Factors/Patient Goals at Discharge (Final Review):    ITP Comments:  ITP Comments    Row Name 10/24/19 1138 10/27/19 1623 11/03/19 1403 11/16/19 0639 12/14/19 1627   ITP Comments Initial  orientation completed. Diagnosis can be found in media tab. EP orienation scheduled for Thursday 7/22 at 2:30. Completed 6MWT and gym orientation. Initial ITP created and sent for review to Dr. Emily Filbert, Medical Director. First full day of exercise!  Patient was oriented to gym and equipment including functions, settings, policies, and procedures.  Patient's individual exercise prescription and treatment plan were reviewed.  All starting workloads were established based on the results of the 6 minute walk test done at initial orientation visit.  The plan for exercise progression was also introduced and progression will be customized based on patient's performance and goals. 30 Day review completed. Medical Director ITP review done, changes made as directed, and signed approval by Medical Director. 30 day review completed. ITP sent to Dr. Emily Filbert, Medical Director of Cardiac and Pulmonary Rehab. Continue with ITP unless changes are made by physician.   Row Name 12/29/19 2505 01/09/20 1721 01/11/20 0615 01/24/20 0842 01/30/20 1421   ITP Comments Currently out recovering from cataract surgery. Chase Keller has been out since cataract surgery. 30 Day review completed. Medical Director ITP review done, changes made as directed, and signed  approval by Medical Director. Chase Keller has been out with  cataract surgery.  Left message about when they plan to return yesterday.  Last visit was 12/19/2019. Called to check in with patient.  Left message on wife's cell phone number.   [place] Name 01/30/20 1527           ITP Comments Pt's wife returned call.  Needs knee replacement.  Will discharge at this time.              Comments: Discharge ITP

## 2020-01-30 NOTE — Progress Notes (Signed)
Discharge Progress Report  Patient Details  Name: Chase Keller MRN: [id number] Date of Birth: 27-Mar-1941 Referring Provider:     Cardiac Rehab from 10/27/2019 in [facility]  Referring Provider Marylene Land, NP ([place])       Number of Visits: 13  Reason for Discharge:  Early Exit:  Personal and Lack of attendance  Smoking History:  Social History   Tobacco Use  Smoking Status Never Smoker  Smokeless Tobacco Never Used  Tobacco Comment   father smoked in the home while pt was growing up//ex-wife smoked    Diagnosis:  Type 2 myocardial infarction [facility])  ADL [facility]SD:   Initial Exercise Prescription:  Initial Exercise Prescription - 10/27/19 1600      Date of Initial Exercise RX and Referring Provider   Date 10/27/19    Referring Provider Marylene Land, NP ([place])      Treadmill   MPH 1.4    Grade 0    Minutes 15    METs 2.07      Recumbant Bike   Level 2    RPM 60    Watts 10    Minutes 15    METs 1.6      NuStep   Level 2    SPM 80    Minutes 15    METs 1.6      REL-XR   Level 2    Speed 50    Minutes 15    METs 1.6      Biostep-RELP   Level 2    SPM 50    Minutes 15    METs 1.6      Prescription Details   Frequency (times per week) 3    Duration Progress to 30 minutes of continuous aerobic without signs/symptoms of physical distress      Intensity   THRR 40-80% of Max Heartrate 91-125    Ratings of Perceived Exertion 11-13    Perceived Dyspnea 0-4      Progression   Progression Continue to progress workloads to maintain intensity without signs/symptoms of physical distress.      Resistance Training   Training Prescription Yes    Weight 3 lb    Reps 10-15           Discharge Exercise Prescription (Final Exercise Prescription Changes):  Exercise Prescription Changes - 12/29/19 0800      Response to Exercise   Blood Pressure (Admit) 130/64    Blood Pressure (Exercise) 120/68    Blood Pressure (Exit) 104/64      Heart Rate (Admit) 70 bpm    Heart Rate (Exercise) 74 bpm    Heart Rate (Exit) 64 bpm    Rating of Perceived Exertion (Exercise) 11    Symptoms none    Duration Continue with 30 min of aerobic exercise without signs/symptoms of physical distress.    Intensity THRR unchanged      Progression   Progression Continue to progress workloads to maintain intensity without signs/symptoms of physical distress.    Average METs 1.6      Resistance Training   Training Prescription Yes    Weight 3 lb    Reps 10-15      Interval Training   Interval Training No      NuStep   Level 2    Minutes 30    METs 1.6           Functional Capacity:  6 Minute Walk    Row Name  10/27/19 1649         6 Minute Walk   Phase Initial     Distance 997 feet     Walk Time 6 minutes     # of Rest Breaks 0     MPH 1.88     METS 1.61     RPE 9     Perceived Dyspnea  0     VO2 Peak 5.63     Symptoms No     Resting HR 58 bpm     Resting BP 134/66     Resting Oxygen Saturation  95 %     Exercise Oxygen Saturation  during 6 min walk 96 %     Max Ex. HR 94 bpm     Max Ex. BP 154/68     2 Minute Post BP 130/70            Psychological, QOL, Others - Outcomes: PHQ 2/9: Depression screen [facility] 2/9 10/27/2019 02/26/2012  Decreased Interest 0 0  Down, Depressed, Hopeless 0 0  PHQ - 2 Score 0 0  Altered sleeping 0 -  Tired, decreased energy 0 -  Change in appetite 0 -  Feeling bad or failure about yourself  0 -  Trouble concentrating 0 -  Moving slowly or fidgety/restless 0 -  Suicidal thoughts 0 -  PHQ-9 Score 0 -  Difficult doing work/chores Not difficult at all -    Quality of Life:  Quality of Life - 10/27/19 1641      Quality of Life   Select Quality of Life      Quality of Life Scores   Health/Function Pre 25.36 %    Socioeconomic Pre 30 %    Psych/Spiritual Pre 26.79 %    Family Pre 28.5 %    GLOBAL Pre 27.21 %           Personal Goals: Goals established at  orientation with interventions provided to work toward goal.  Personal Goals and Risk Factors at Admission - 10/27/19 1644      Core Components/Risk Factors/Patient Goals on Admission    Weight Management Yes;Weight Loss    Intervention Weight Management: Develop a combined nutrition and exercise program designed to reach desired caloric intake, while maintaining appropriate intake of nutrient and fiber, sodium and fats, and appropriate energy expenditure required for the weight goal.;Weight Management: Provide education and appropriate resources to help participant work on and attain dietary goals.;Weight Management/Obesity: Establish reasonable short term and long term weight goals.    Admit Weight 215 lb 4.8 oz (97.7 kg)    Goal Weight: Short Term 210 lb (95.3 kg)    Goal Weight: Long Term 205 lb (93 kg)    Expected Outcomes Short Term: Continue to assess and modify interventions until short term weight is achieved;Long Term: Adherence to nutrition and physical activity/exercise program aimed toward attainment of established weight goal;Weight Loss: Understanding of general recommendations for a balanced deficit meal plan, which promotes 1-2 lb weight loss per week and includes a negative energy balance of [phone] kcal/d;Understanding recommendations for meals to include 15-35% energy as protein, 25-35% energy from fat, 35-60% energy from carbohydrates, less than 219m of dietary cholesterol, 20-35 gm of total fiber daily;Understanding of distribution of calorie intake throughout the day with the consumption of 4-5 meals/snacks    Diabetes Yes    Intervention Provide education about signs/symptoms and action to take for hypo/hyperglycemia.;Provide education about proper nutrition, including hydration, and aerobic/resistive exercise prescription along  with prescribed medications to achieve blood glucose in normal ranges: Fasting glucose 65-99 mg/dL    Expected Outcomes Short Term: Participant  verbalizes understanding of the signs/symptoms and immediate care of hyper/hypoglycemia, proper foot care and importance of medication, aerobic/resistive exercise and nutrition plan for blood glucose control.;Long Term: Attainment of HbA1C < 7%.    Hypertension Yes    Intervention Provide education on lifestyle modifcations including regular physical activity/exercise, weight management, moderate sodium restriction and increased consumption of fresh fruit, vegetables, and low fat dairy, alcohol moderation, and smoking cessation.;Monitor prescription use compliance.    Expected Outcomes Short Term: Continued assessment and intervention until BP is < 140/41m HG in hypertensive participants. < 130/811mHG in hypertensive participants with diabetes, heart failure or chronic kidney disease.;Long Term: Maintenance of blood pressure at goal levels.    Lipids Yes    Intervention Provide education and support for participant on nutrition & aerobic/resistive exercise along with prescribed medications to achieve LDL <7062mHDL >60m26m  Expected Outcomes Short Term: Participant states understanding of desired cholesterol values and is compliant with medications prescribed. Participant is following exercise prescription and nutrition guidelines.;Long Term: Cholesterol controlled with medications as prescribed, with individualized exercise RX and with personalized nutrition plan. Value goals: LDL < 70mg25mL > 40 mg.            Personal Goals Discharge:   Exercise Goals and Review:  Exercise Goals    Row Name 10/27/19 1642             Exercise Goals   Increase Physical Activity Yes       Intervention Provide advice, education, support and counseling about physical activity/exercise needs.;Develop an individualized exercise prescription for aerobic and resistive training based on initial evaluation findings, risk stratification, comorbidities and participant's personal goals.       Expected Outcomes  Short Term: Attend rehab on a regular basis to increase amount of physical activity.;Long Term: Add in home exercise to make exercise part of routine and to increase amount of physical activity.;Long Term: Exercising regularly at least 3-5 days a week.       Increase Strength and Stamina Yes       Intervention Provide advice, education, support and counseling about physical activity/exercise needs.;Develop an individualized exercise prescription for aerobic and resistive training based on initial evaluation findings, risk stratification, comorbidities and participant's personal goals.       Expected Outcomes Short Term: Increase workloads from initial exercise prescription for resistance, speed, and METs.;Short Term: Perform resistance training exercises routinely during rehab and add in resistance training at home;Long Term: Improve cardiorespiratory fitness, muscular endurance and strength as measured by increased METs and functional capacity (6MWT)       Able to understand and use rate of perceived exertion (RPE) scale Yes       Intervention Provide education and explanation on how to use RPE scale       Expected Outcomes Short Term: Able to use RPE daily in rehab to express subjective intensity level;Long Term:  Able to use RPE to guide intensity level when exercising independently       Able to understand and use Dyspnea scale Yes       Intervention Provide education and explanation on how to use Dyspnea scale       Expected Outcomes Short Term: Able to use Dyspnea scale daily in rehab to express subjective sense of shortness of breath during exertion;Long Term: Able to use Dyspnea scale to guide intensity  level when exercising independently       Knowledge and understanding of Target Heart Rate Range (THRR) Yes       Intervention Provide education and explanation of THRR including how the numbers were predicted and where they are located for reference       Expected Outcomes Short Term: Able to  state/look up THRR;Short Term: Able to use daily as guideline for intensity in rehab;Long Term: Able to use THRR to govern intensity when exercising independently       Able to check pulse independently Yes       Intervention Provide education and demonstration on how to check pulse in carotid and radial arteries.;Review the importance of being able to check your own pulse for safety during independent exercise       Expected Outcomes Short Term: Able to explain why pulse checking is important during independent exercise;Long Term: Able to check pulse independently and accurately       Understanding of Exercise Prescription Yes       Intervention Provide education, explanation, and written materials on patient's individual exercise prescription       Expected Outcomes Short Term: Able to explain program exercise prescription;Long Term: Able to explain home exercise prescription to exercise independently              Exercise Goals Re-Evaluation:  Exercise Goals Re-Evaluation    Row Name 11/03/19 1403 11/16/19 1344 11/30/19 1359 12/29/19 0812 01/24/20 0843     Exercise Goal Re-Evaluation   Exercise Goals Review Increase Physical Activity;Able to understand and use rate of perceived exertion (RPE) scale;Knowledge and understanding of Target Heart Rate Range (THRR);Understanding of Exercise Prescription;Increase Strength and Stamina;Able to check pulse independently Increase Physical Activity;Increase Strength and Stamina Increase Physical Activity;Increase Strength and Stamina;Understanding of Exercise Prescription Increase Physical Activity;Increase Strength and Stamina;Understanding of Exercise Prescription --   Comments Reviewed RPE and dyspnea scales, THR and program prescription with pt today.  Pt voiced understanding and was given a copy of goals to take home Chase Keller is tolerating exercise well so far.  He has increased speed on TM.  Staff will monitor progress. Chase Keller is doing well in rehab. His  knee has been giving him some problems and he has started to wear a knee brace.  He says that it will occasionally flare up on him.  He was able to still do the NuStep but not the bike this week.  We will continue to monitor his progress. Currently out recovering from cataract surgery. Only attended once since last review.  Once cleared to return we will continue to monitor progress. Out since last review.   Expected Outcomes Short: Use RPE daily to regulate intensity. Long: Follow program prescription in THR. Short: attend consistently Long:  improve stamina and MET level Short: Allow knee to recover Long: Continue to improve stamina. Short: Return to regular attendance Long: Continue to improve stamina. --          Nutrition & Weight - Outcomes:  Pre Biometrics - 10/27/19 1642      Pre Biometrics   Height 5' 6"  (1.676 m)    Weight 215 lb 4.8 oz (97.7 kg)    BMI (Calculated) 34.77    Single Leg Stand 13.4 seconds            Nutrition:  Nutrition Therapy & Goals - 11/21/19 1713      Nutrition Therapy   Diet Heart healthy, low Na, T2DM    Drug/Food Interactions Coumadin/Vit K  Protein (specify units) 80-85g    Fiber 30 grams    Whole Grain Foods 3 servings    Saturated Fats 12 max. grams    Fruits and Vegetables 5 servings/day    Sodium 1.5 grams      Personal Nutrition Goals   Nutrition Goal ST: increase vegetable intake, eat heart healthy fats LT: reduce knee pain    Comments t primary diagnosis for Cardiac Rehab is type 2 myocardial infarction. Pt also presents with HTN, CAD, CHF, A-fib, OSA with daytime narcolepsy, hypothyroidism, T2DM (6.3 not recent), HLD. Current Relevant Medications: vitamin C, Ca/vitamin D, vitamin D, colace, lasix, glipizide, potassium, senokot, vitamin E, coumadin. Chase Keller doesn't like sweets and will eat smaller amounts of CHOs as well as baked chicken and fish, will aslo have fried chicken. Since Chase Keller is on coumadin it is discouraged from taking omega-3  supplements, but can have some dietary sources such as fish, walnuts, and flaxseeds; pt is already eating fish. also suggested increasing fruit and vegetable intake as this could help with his inflammation. Discussed heart healthy eating and diabetes friendly eating. His wife's eating schedule dictates his; if she waits to eat lunch, he will as well and since she is not cooking he is going out to eat and eating more fried chicken. His wife reports he has an aversion to "anything green".      Intervention Plan   Intervention Prescribe, educate and counsel regarding individualized specific dietary modifications aiming towards targeted core components such as weight, hypertension, lipid management, diabetes, heart failure and other comorbidities.;Nutrition handout(s) given to patient.    Expected Outcomes Short Term Goal: Understand basic principles of dietary content, such as calories, fat, sodium, cholesterol and nutrients.;Short Term Goal: A plan has been developed with personal nutrition goals set during dietitian appointment.;Long Term Goal: Adherence to prescribed nutrition plan.           Nutrition Discharge:  Nutrition Assessments - 10/27/19 1641      MEDFICTS Scores   Pre Score 53           Education Questionnaire Score:  Knowledge Questionnaire Score - 10/27/19 1631      Knowledge Questionnaire Score   Pre Score 23/26: A & P, Exercise           Goals reviewed with patient; copy given to patient.

## 2020-05-31 ENCOUNTER — Encounter (HOSPITAL_COMMUNITY): Payer: Self-pay

## 2020-05-31 NOTE — Progress Notes (Addendum)
PCP - Brett Canales PA-C Clearance on char t2-2-22 Cardiologist - Dr. Wallie Renshaw  PPM/ICD -  Device Orders -  Rep Notified -   Chest x-ray -  EKG -  Stress Test -  ECHO -  Cardiac Cath -   Sleep Study -  CPAP -   Fasting Blood Sugar -  Checks Blood Sugar ___1__ times a week Blood Thinner Instructions:Coumadin last dose 06-02-20 Aspirin Instructions:81 mg  ERAS Protcol - PRE-SURGERY Ensure or G2-   COVID TEST- 06-05-20   Anesthesia review: CABG, MI,CAD /stent, Afib ,CHF,HTN, OSA, DM   Activity--Able to walk to mailbox without SOB   Patient denies shortness of breath, fever, cough and chest pain at PAT appointment   All instructions explained to the patient, with a verbal understanding of the material. Patient agrees to go over the instructions while at home for a better understanding. Patient also instructed to self quarantine after being tested for COVID-19. The opportunity to ask questions was provided.

## 2020-05-31 NOTE — Patient Instructions (Addendum)
DUE TO COVID-19 Two  VISITORS are ALLOWED TO COME WITH YOU AND STAY IN THE WAITING ROOM ONLY DURING PRE OP AND PROCEDURE DAY OF SURGERY. TWO VISITORS  MAY VISIT WITH YOU AFTER SURGERY IN YOUR PRIVATE ROOM DURING VISITING HOURS ONLY!  YOU NEED TO HAVE A COVID 19 TEST ON___3-1-22____ @_______ , THIS TEST MUST BE DONE BEFORE SURGERY,  COVID TESTING SITE [address] [place] [address], IT IS ON THE RIGHT GOING OUT [address] APPROXIMATELY  2 MINUTES PAST [employer] ON THE RIGHT. ONCE YOUR COVID TEST IS COMPLETED,  PLEASE BEGIN THE QUARANTINE INSTRUCTIONS AS OUTLINED IN YOUR HANDOUT.                ADE STMARIE  05/31/2020   Your procedure is scheduled on: 06-08-20   Report to [facility] Main  Entrance   Report to admitting at      0750   AM       Bring cpap mask and tubing only     Call this number if you have problems the morning of surgery [phone]    Remember: Do not eat food  :After Midnight.  You may have clear liquids until       0720 am then nothing by mouth    CLEAR LIQUID DIET  Until 0720 am then nothing by mouth Allowed                                                                      Black Coffee and tea, regular and decaf                            Plain Jell-O any favor except red or purple                                            Fruit ices (not with fruit pulp)                                     Iced Popsicles                                     Carbonated beverages, regular and diet                                    Cranberry, grape and apple juices Sports drinks like Gatorade Lightly seasoned clear broth or consume(fat free) Sugar, honey syrup  _____________________________________________________________________     BRUSH YOUR TEETH MORNING OF SURGERY AND RINSE YOUR MOUTH OUT, NO CHEWING GUM CANDY OR MINTS.     Take these medicines the morning of surgery with A SIP OF WATER: [place], Zetia, carvedilol, amlodipine  if BP > 170, allopurinol, inhaler and bring it with you  You may not have any metal on your body including hair pins and              piercings  Do not wear jewelry, , lotions, powders or perfumes, deodorant                      Men may shave face and neck.   Do not bring valuables to the hospital. [place].  Contacts, dentures or bridgework may not be worn into surgery.  .     Patients discharged the day of surgery will not be allowed to drive home. IF YOU ARE HAVING SURGERY AND GOING HOME THE SAME DAY, YOU MUST HAVE AN ADULT TO DRIVE YOU HOME AND BE WITH YOU FOR 24 HOURS. YOU MAY GO HOME BY TAXI OR UBER OR ORTHERWISE, BUT AN ADULT MUST ACCOMPANY YOU HOME AND STAY WITH YOU FOR 24 HOURS.  Name and phone number of your driver:  Special Instructions: N/A              Please read over the following fact sheets you were given: _____________________________________________________________________             [facility] - Preparing for Surgery Before surgery, you can play an important role.  Because skin is not sterile, your skin needs to be as free of germs as possible.  You can reduce the number of germs on your skin by washing with CHG (chlorahexidine gluconate) soap before surgery.  CHG is an antiseptic cleaner which kills germs and bonds with the skin to continue killing germs even after washing. Please DO NOT use if you have an allergy to CHG or antibacterial soaps.  If your skin becomes reddened/irritated stop using the CHG and inform your nurse when you arrive at Short Stay. Do not shave (including legs and underarms) for at least 48 hours prior to the first CHG shower.  You may shave your face/neck. Please follow these instructions carefully:  1.  Shower with CHG Soap the night before surgery and the  morning of Surgery.  2.  If you choose to wash your hair, wash your hair first as usual with your   normal  shampoo.  3.  After you shampoo, rinse your hair and body thoroughly to remove the  shampoo.                           4.  Use CHG as you would any other liquid soap.  You can apply chg directly  to the skin and wash                       Gently with a scrungie or clean washcloth.  5.  Apply the CHG Soap to your body ONLY FROM THE NECK DOWN.   Do not use on face/ open                           Wound or open sores. Avoid contact with eyes, ears mouth and genitals (private parts).                       Wash face,  Genitals (private parts) with your normal soap.  6.  Wash thoroughly, paying special attention to the area where your surgery  will be performed.  7.  Thoroughly rinse your body with warm water from the neck down.  8.  DO NOT shower/wash with your normal soap after using and rinsing off  the CHG Soap.                9.  Pat yourself dry with a clean towel.            10.  Wear clean pajamas.            11.  Place clean sheets on your bed the night of your first shower and do not  sleep with pets. Day of Surgery : Do not apply any lotions/deodorants the morning of surgery.  Please wear clean clothes to the hospital/surgery center.  FAILURE TO FOLLOW THESE INSTRUCTIONS MAY RESULT IN THE CANCELLATION OF YOUR SURGERY PATIENT SIGNATURE_________________________________  NURSE SIGNATURE__________________________________  ________________________________________________________________________   Adam Phenix  An incentive spirometer is a tool that can help keep your lungs clear and active. This tool measures how well you are filling your lungs with each breath. Taking long deep breaths may help reverse or decrease the chance of developing breathing (pulmonary) problems (especially infection) following:  A long period of time when you are unable to move or be active. BEFORE THE PROCEDURE   If the spirometer includes an indicator to show your best effort, your  nurse or respiratory therapist will set it to a desired goal.  If possible, sit up straight or lean slightly forward. Try not to slouch.  Hold the incentive spirometer in an upright position. INSTRUCTIONS FOR USE  1. Sit on the edge of your bed if possible, or sit up as far as you can in bed or on a chair. 2. Hold the incentive spirometer in an upright position. 3. Breathe out normally. 4. Place the mouthpiece in your mouth and seal your lips tightly around it. 5. Breathe in slowly and as deeply as possible, raising the piston or the ball toward the top of the column. 6. Hold your breath for 3-5 seconds or for as long as possible. Allow the piston or ball to fall to the bottom of the column. 7. Remove the mouthpiece from your mouth and breathe out normally. 8. Rest for a few seconds and repeat Steps 1 through 7 at least 10 times every 1-2 hours when you are awake. Take your time and take a few normal breaths between deep breaths. 9. The spirometer may include an indicator to show your best effort. Use the indicator as a goal to work toward during each repetition. 10. After each set of 10 deep breaths, practice coughing to be sure your lungs are clear. If you have an incision (the cut made at the time of surgery), support your incision when coughing by placing a pillow or rolled up towels firmly against it. Once you are able to get out of bed, walk around indoors and cough well. You may stop using the incentive spirometer when instructed by your caregiver.  RISKS AND COMPLICATIONS  Take your time so you do not get dizzy or light-headed.  If you are in pain, you may need to take or ask for pain medication before doing incentive spirometry. It is harder to take a deep breath if you are having pain. AFTER USE  Rest and breathe slowly and easily.  It can be helpful to keep track of a log of your  progress. Your caregiver can provide you with a simple table to help with this. If you are using the  spirometer at home, follow these instructions: [place] IF:   You are having difficultly using the spirometer.  You have trouble using the spirometer as often as instructed.  Your pain medication is not giving enough relief while using the spirometer.  You develop fever of 100.5 F (38.1 C) or higher. SEEK IMMEDIATE MEDICAL CARE IF:   You cough up bloody sputum that had not been present before.  You develop fever of 102 F (38.9 C) or greater.  You develop worsening pain at or near the incision site. MAKE SURE YOU:   Understand these instructions.  Will watch your condition.  Will get help right away if you are not doing well or get worse. Document Released: 08/04/2006 Document Revised: 06/16/2011 Document Reviewed: 10/05/2006 [facility] Patient Information 2014 [place], [place].   ________________________________________________________________________

## 2020-05-31 NOTE — Progress Notes (Signed)
Please place orders in epic pt. Has a preop

## 2020-06-01 ENCOUNTER — Encounter (HOSPITAL_COMMUNITY): Admission: RE | Admit: 2020-06-01 | Payer: No Typology Code available for payment source | Source: Ambulatory Visit

## 2020-06-04 ENCOUNTER — Other Ambulatory Visit: Payer: Self-pay

## 2020-06-04 ENCOUNTER — Encounter (HOSPITAL_COMMUNITY): Payer: Self-pay

## 2020-06-04 ENCOUNTER — Encounter (HOSPITAL_COMMUNITY)
Admission: RE | Admit: 2020-06-04 | Discharge: 2020-06-04 | Disposition: A | Discharge status: Home / Self Care | Payer: Medicare PPO | Source: Ambulatory Visit | Attending: Orthopedic Surgery | Admitting: Orthopedic Surgery

## 2020-06-04 DIAGNOSIS — Z01818 Encounter for other preprocedural examination: Secondary | ICD-10-CM | POA: Diagnosis not present

## 2020-06-04 DIAGNOSIS — I4891 Unspecified atrial fibrillation: Secondary | ICD-10-CM | POA: Insufficient documentation

## 2020-06-04 DIAGNOSIS — I4510 Unspecified right bundle-branch block: Secondary | ICD-10-CM | POA: Diagnosis not present

## 2020-06-04 HISTORY — DX: Personal history of urinary calculi: Z87.442

## 2020-06-04 HISTORY — DX: Chronic obstructive pulmonary disease, unspecified: J44.9

## 2020-06-04 HISTORY — DX: Type 2 diabetes mellitus without complications: E11.9

## 2020-06-04 HISTORY — DX: Malignant (primary) neoplasm, unspecified: C80.1

## 2020-06-04 HISTORY — DX: Contact with and (suspected) exposure to other hazardous, chiefly nonmedicinal, chemicals: Z77.098

## 2020-06-04 LAB — CBC
HCT: 47.7 % (ref 39.0–52.0)
Hemoglobin: 14.9 g/dL (ref 13.0–17.0)
MCH: 29.6 pg (ref 26.0–34.0)
MCHC: 31.2 g/dL (ref 30.0–36.0)
MCV: 94.6 fL (ref 80.0–100.0)
Platelets: 161 10*3/uL (ref 150–400)
RBC: 5.04 MIL/uL (ref 4.22–5.81)
RDW: 15.9 % — ABNORMAL HIGH (ref 11.5–15.5)
WBC: 10.6 10*3/uL — ABNORMAL HIGH (ref 4.0–10.5)
nRBC: 0 % (ref 0.0–0.2)

## 2020-06-04 LAB — BASIC METABOLIC PANEL
Anion gap: 14 (ref 5–15)
BUN: 24 mg/dL — ABNORMAL HIGH (ref 8–23)
CO2: 23 mmol/L (ref 22–32)
Calcium: 10 mg/dL (ref 8.9–10.3)
Chloride: 103 mmol/L (ref 98–111)
Creatinine, Ser: 0.96 mg/dL (ref 0.61–1.24)
GFR, Estimated: >60 mL/min (ref >60)
Glucose, Bld: 121 mg/dL — ABNORMAL HIGH (ref 70–99)
Potassium: 4.3 mmol/L (ref 3.5–5.1)
Sodium: 140 mmol/L (ref 135–145)

## 2020-06-04 LAB — GLUCOSE, CAPILLARY: Glucose-Capillary: 124 mg/dL — ABNORMAL HIGH (ref 70–99)

## 2020-06-04 LAB — SURGICAL PCR SCREEN
MRSA, PCR: NEGATIVE
Staphylococcus aureus: NEGATIVE

## 2020-06-05 ENCOUNTER — Other Ambulatory Visit (HOSPITAL_COMMUNITY)
Admission: RE | Admit: 2020-06-05 | Discharge: 2020-06-05 | Disposition: A | Discharge status: Home / Self Care | Payer: Medicare PPO | Source: Ambulatory Visit | Attending: Orthopedic Surgery | Admitting: Orthopedic Surgery

## 2020-06-05 DIAGNOSIS — Z01812 Encounter for preprocedural laboratory examination: Secondary | ICD-10-CM | POA: Insufficient documentation

## 2020-06-05 DIAGNOSIS — Z20822 Contact with and (suspected) exposure to covid-19: Secondary | ICD-10-CM | POA: Insufficient documentation

## 2020-06-05 LAB — HEMOGLOBIN A1C
Hgb A1c MFr Bld: 6.1 % — ABNORMAL HIGH (ref 4.8–5.6)
Mean Plasma Glucose: 128 mg/dL

## 2020-06-05 NOTE — Progress Notes (Signed)
Anesthesia Chart Review   Case: [id number] Date/Time: 06/08/20 1005   Procedure: TOTAL KNEE ARTHROPLASTY (Right Knee)   Anesthesia type: Spinal   Pre-op diagnosis: djd right knee   Location: [place] [facility]   Surgeons: Earlie Server, MD      DISCUSSION:79 y.o. never smoker with h/o PONV, HTN, sleep apnea with CPAP, COPD, DM II diet controlled, CAD (CABG 2006), PAF (on Coumadin), right knee djd scheduled for above procedure 06/08/20 with Dr. Earlie Server.   Clearance on chart from PCP which states, "Cleared for surgery by cardiology and me."   Pt last seen by cardiology 05/01/2020 for preop evaluation.  Per OV note, "BP well controlled. Last TTE in 08/2019 reported mild MR vs prior one in 2020 which called mod to severe MR. No symptoms today. He has ET limitation due to severe knee pain. LVIDS not dilated EF >55%. Does not need further work up before surgery. RCRI is 1. Has good ET. No AS noted on Echo in June 2021."  "Stop Warfarin 5 days prior to surgery."  Anticipate pt can proceed with planned procedure barring acute status change.   VS: BP 125/67   Pulse 79   Temp 36.5 C (Oral)   Resp 16   Ht 5\' 7"  (1.702 m)   Wt 93.4 kg   SpO2 98%   BMI 32.26 kg/m   PROVIDERS: [facility] [place] is PCP   [place]rey Rolls, MD is Cardiologist with [facility]: Labs reviewed: Acceptable for surgery. (all labs ordered are listed, but only abnormal results are displayed)  Labs Reviewed  HEMOGLOBIN A1C - Abnormal; Notable for the following components:      Result Value   Hgb A1c MFr Bld 6.1 (*)    All other components within normal limits  BASIC METABOLIC PANEL - Abnormal; Notable for the following components:   Glucose, Bld 121 (*)    BUN 24 (*)    All other components within normal limits  CBC - Abnormal; Notable for the following components:   WBC 10.6 (*)    RDW 15.9 (*)    All other components within normal limits  GLUCOSE, CAPILLARY - Abnormal; Notable for the following components:    Glucose-Capillary 124 (*)    All other components within normal limits  SURGICAL PCR SCREEN     IMAGES:   EKG: 06/04/2020 Rate 62 bpm  Atrial fibrillation  Incomplete RBBB Cannot rule out anterior infarct, age undetermined   CV: Echo 06/2018 The overall LV systolic function is well-maintained (Ejection-Fraction=>55%). There is moderate left ventricular hypertrophy The left ventricle is normal in size.  LA moderately to severely dilated.  The right atrium is mildly dilated.  The IVC is normal.  The IVC collapses normally.  There is moderate mitral regurgitation The mitral regurgitant jet is eccentrically directed.  There is mild tricuspid regurgitation Doppler findings do not suggest pulmonary hypertension Compared to prior, there is more MR appreciated. MR likely moderate to severe given eccentricity and jet goes to the back of the atrium Past Medical History:  Diagnosis Date  . Agent orange exposure   . Atrial fibrillation ([place]) 2013  . Cancer ([place])    bladder  . CHF (congestive heart failure) ([place])   . COPD (chronic obstructive pulmonary disease) ([place])   . Coronary artery disease   . Diabetes mellitus without complication ([facility])    diet controlled  . Dysrhythmia     A fib  . Heart murmur    / 1  MD said yes, 1 said no  . History of blood transfusion    during CABG  . History of kidney stones   . Hyperlipidemia   . Hypertension   . Left knee DJD   . MRSA (methicillin resistant staph aureus) culture positive    Drainage from Left Knee approx 2005  . Myocardial infarction ([place])    mild in 1998 per Wife   . PONV (postoperative nausea and vomiting)   . S/P coronary artery bypass graft x 2    1998  . Shortness of breath   . Sleep apnea    CPAP  . Stented coronary artery     Past Surgical History:  Procedure Laterality Date  . Bone spurs     Back  . Cardiac stents  2005  . CORONARY ARTERY BYPASS GRAFT  1988   2 vessel,  followed by [facility]  . NASAL SINUS  SURGERY     Due to sinus "impaction"  . PTCA  2005   [name]  . skin moles biopsied    . TOTAL KNEE ARTHROPLASTY  10/13/2011   Procedure: TOTAL KNEE ARTHROPLASTY;  Surgeon: Lorn Junes, MD;  Location: [facility];  Service: Orthopedics;  Laterality: Left;  left total knee arthroplasty    MEDICATIONS: . ACAI BERRY PO  . albuterol (VENTOLIN HFA) 108 (90 Base) MCG/ACT inhaler  . ALFALFA PO  . allopurinol (ZYLOPRIM) 300 MG tablet  . amLODipine (NORVASC) 2.5 MG tablet  . ammonium lactate (LAC-HYDRIN) 12 % lotion  . APPLE CIDER VINEGAR PO  . Ascorbic Acid (VITAMIN C) 1000 MG tablet  . aspirin EC 81 MG tablet  . Calcium Carb-Cholecalciferol (CALCIUM/VITAMIN D) 600-400 MG-UNIT TABS  . carvedilol (COREG) 6.25 MG tablet  . Cholecalciferol (VITAMIN D3) 250 MCG (10000 UT) capsule  . Cinnamon 500 MG capsule  . Coenzyme Q10 200 MG capsule  . cyanocobalamin 1000 MCG tablet  . ezetimibe (ZETIA) 10 MG tablet  . ferrous sulfate 325 (65 FE) MG tablet  . fluticasone (FLONASE) 50 MCG/ACT nasal spray  . Glucosamine-Chondroit-Vit C-Mn (GLUCOSAMINE CHONDR 500 COMPLEX) CAPS  . niacin 500 MG CR capsule  . Potassium 99 MG TABS  . pyridOXINE (B-6) 50 MG tablet  . RED YEAST RICE EXTRACT PO  . riboflavin (VITAMIN B-2) 100 MG TABS tablet  . senna-docusate (SENOKOT-S) 8.6-50 MG tablet  . Skin Protectants, Misc. (HYDROCERIN EX)  . spironolactone (ALDACTONE) 25 MG tablet  . tamsulosin (FLOMAX) 0.4 MG CAPS capsule  . thiamine 100 MG tablet  . traMADol (ULTRAM) 50 MG tablet  . trospium (SANCTURA) 20 MG tablet  . warfarin (COUMADIN) 1 MG tablet  . zinc gluconate 50 MG tablet   No current facility-administered medications for this encounter.    Konrad Felix, PA-C [facility] Pre-Surgical Testing [phone]

## 2020-06-05 NOTE — Anesthesia Preprocedure Evaluation (Addendum)
Anesthesia Evaluation  Patient identified by MRN, date of birth, ID band Patient awake    Reviewed: Allergy & Precautions, NPO status , Patient's Chart, lab work & pertinent test results  History of Anesthesia Complications (+) PONV  Airway Mallampati: II  TM Distance: >3 FB Neck ROM: Full    Dental no notable dental hx. (+) Implants, Edentulous Upper, Edentulous Lower   Pulmonary sleep apnea and Continuous Positive Airway Pressure Ventilation , COPD,    Pulmonary exam normal breath sounds clear to auscultation       Cardiovascular hypertension, + CAD and + Past MI  Normal cardiovascular exam+ dysrhythmias Atrial Fibrillation  Rhythm:Regular Rate:Normal     Neuro/Psych negative neurological ROS  negative psych ROS   GI/Hepatic negative GI ROS, Neg liver ROS,   Endo/Other  diabetes  Renal/GU negative Renal ROS     Musculoskeletal  (+) Arthritis ,   Abdominal (+) + obese,   Peds  Hematology   Anesthesia Other Findings   Reproductive/Obstetrics                         Anesthesia Physical Anesthesia Plan  ASA: III  Anesthesia Plan: Spinal   Post-op Pain Management:  [facility] for Post-op pain   Induction:   PONV Risk Score and Plan: 2 and Treatment may vary due to age or medical condition and Ondansetron  Airway Management Planned: Natural Airway and Nasal Cannula  Additional Equipment: None  Intra-op Plan:   Post-operative Plan:   Informed Consent: I have reviewed the patients History and Physical, chart, labs and discussed the procedure including the risks, benefits and alternatives for the proposed anesthesia with the patient or authorized representative who has indicated his/her understanding and acceptance.     Dental advisory given  Plan Discussed with: CRNA and Anesthesiologist  Anesthesia Plan Comments: (See PAT note 06/04/2020, Konrad Felix, PA-C)       Anesthesia Quick Evaluation

## 2020-06-06 LAB — SARS CORONAVIRUS 2 (TAT 6-24 HRS): SARS Coronavirus 2: NEGATIVE

## 2020-06-07 ENCOUNTER — Ambulatory Visit: Payer: Self-pay | Admitting: Physician Assistant

## 2020-06-07 NOTE — H&P (Signed)
TOTAL KNEE ADMISSION H&P  Patient is being admitted for right total knee arthroplasty.  Subjective:  Chief Complaint:right knee pain.  HPI: Chase Keller, 80 y.o. male, has a history of pain and functional disability in the right knee due to arthritis and has failed non-surgical conservative treatments for greater than 12 weeks to includeNSAID's and/or analgesics, corticosteriod injections and activity modification.  Onset of symptoms was gradual, starting >10 years ago with gradually worsening course since that time. The patient noted no past surgery on the right knee(s).  Patient currently rates pain in the right knee(s) at 9 out of 10 with activity. Patient has night pain, worsening of pain with activity and weight bearing, pain that interferes with activities of daily living, pain with passive range of motion, crepitus and joint swelling.  Patient has evidence of periarticular osteophytes and joint space narrowing by imaging studies.  There is no active infection.  Patient Active Problem List   Diagnosis Date Noted  . Pain due to onychomycosis of toenails of both feet 11/08/2018  . Pincer nail deformity 11/08/2018  . Coagulation disorder ([place]) 11/08/2018  . Diabetes mellitus type 2, diet-controlled ([place]) 06/29/2012  . Atrial fibrillation ([place])   . Elevated fasting glucose 02/25/2012  . Long term (current) use of anticoagulants 02/25/2012  . CHF (congestive heart failure) ([place]) 02/24/2012  . Cough 02/24/2012  . Shortness of breath 02/24/2012  . Incomplete right bundle branch block 10/14/2011  . Leukocytosis 10/14/2011  . Bradycardia 10/13/2011  . Left knee DJD   . Coronary artery disease   . Stented coronary artery   . S/P coronary artery bypass graft x 2   . Obesity (BMI 30-39.9) 09/01/2011  . Hypertension 04/06/2011  . Obstructive sleep apnea 04/04/2011  . Hypothyroidism 03/29/2011  . Hyperlipidemia 03/29/2011   Past Medical History:  Diagnosis Date  . Agent orange  exposure   . Atrial fibrillation ([place]) 2013  . Cancer ([place])    bladder  . CHF (congestive heart failure) ([place])   . COPD (chronic obstructive pulmonary disease) ([place])   . Coronary artery disease   . Diabetes mellitus without complication (HCC)    diet controlled  . Dysrhythmia     A fib  . Heart murmur    / 1 MD said yes, 1 said no  . History of blood transfusion    during CABG  . History of kidney stones   . Hyperlipidemia   . Hypertension   . Left knee DJD   . MRSA (methicillin resistant staph aureus) culture positive    Drainage from Left Knee approx 2005  . Myocardial infarction ([place])    mild in 1998 per Wife   . PONV (postoperative nausea and vomiting)   . S/P coronary artery bypass graft x 2    1998  . Shortness of breath   . Sleep apnea    CPAP  . Stented coronary artery     Past Surgical History:  Procedure Laterality Date  . Bone spurs     Back  . Cardiac stents  2005  . CORONARY ARTERY BYPASS GRAFT  1988   2 vessel,  followed by [facility]  . NASAL SINUS SURGERY     Due to sinus "impaction"  . PTCA  2005   [name]  . skin moles biopsied    . TOTAL KNEE ARTHROPLASTY  10/13/2011   Procedure: TOTAL KNEE ARTHROPLASTY;  Surgeon: Lorn Junes, MD;  Location: [facility];  Service: Orthopedics;  Laterality: Left;  left  total knee arthroplasty    Current Outpatient Medications  Medication Sig Dispense Refill Last Dose  . ACAI BERRY PO Take 3,000 mg by mouth daily.     Marland Kitchen albuterol (VENTOLIN HFA) 108 (90 Base) MCG/ACT inhaler Inhale 2 puffs into the lungs every 6 (six) hours as needed for wheezing or shortness of breath.     Marland Kitchen ALFALFA PO Take 1,300 mg by mouth 2 (two) times daily.     Marland Kitchen allopurinol (ZYLOPRIM) 300 MG tablet Take 300 mg by mouth daily.     Marland Kitchen amLODipine (NORVASC) 2.5 MG tablet Take 2.5 mg by mouth daily as needed (If systolic BP over 244.).     Marland Kitchen ammonium lactate (LAC-HYDRIN) 12 % lotion Apply 1 application topically daily as needed for dry skin.     .  APPLE CIDER VINEGAR PO Take 480 mg by mouth daily.     . Ascorbic Acid (VITAMIN C) 1000 MG tablet Take 1,000 mg by mouth daily.     Marland Kitchen aspirin EC 81 MG tablet Take 81 mg by mouth daily.     . Calcium Carb-Cholecalciferol (CALCIUM/VITAMIN D) 600-400 MG-UNIT TABS Take 1 tablet by mouth 2 (two) times daily.     . carvedilol (COREG) 6.25 MG tablet Take by mouth 2 (two) times daily with a meal.     . Cholecalciferol (VITAMIN D3) 250 MCG (10000 UT) capsule Take 10,000 Units by mouth daily.     . Cinnamon 500 MG capsule Take 500 mg by mouth daily.     . Coenzyme Q10 200 MG capsule Take 200 mg by mouth daily.     . cyanocobalamin 1000 MCG tablet Take 1,000 mcg by mouth daily.     Marland Kitchen ezetimibe (ZETIA) 10 MG tablet Take 10 mg by mouth daily.     . ferrous sulfate 325 (65 FE) MG tablet Take 325 mg by mouth daily with breakfast.     . fluticasone (FLONASE) 50 MCG/ACT nasal spray Place 2 sprays into both nostrils daily as needed for rhinitis.     . Glucosamine-Chondroit-Vit C-Mn (GLUCOSAMINE CHONDR 500 COMPLEX) CAPS Take 2 capsules by mouth 2 (two) times daily.     . niacin 500 MG CR capsule Take 500 mg by mouth at bedtime. B3     . Potassium 99 MG TABS Take 99 mg by mouth 2 (two) times daily.     Marland Kitchen pyridOXINE (B-6) 50 MG tablet Take 100 mg by mouth daily.     . RED YEAST RICE EXTRACT PO Take 600 mg by mouth 2 (two) times daily.     . riboflavin (VITAMIN B-2) 100 MG TABS tablet Take 100 mg by mouth daily.     Marland Kitchen senna-docusate (SENOKOT-S) 8.6-50 MG tablet Take 1 tablet by mouth 2 (two) times daily.     . Skin Protectants, Misc. (HYDROCERIN EX) Apply 1 application topically daily as needed (dry skin).     Marland Kitchen spironolactone (ALDACTONE) 25 MG tablet Take 12.5 mg by mouth daily.     . tamsulosin (FLOMAX) 0.4 MG CAPS capsule Take 0.4 mg by mouth at bedtime.     . thiamine 100 MG tablet Take 100 mg by mouth daily.     . traMADol (ULTRAM) 50 MG tablet Take 50 mg by mouth every 6 (six) hours as needed for moderate  pain.     . trospium (SANCTURA) 20 MG tablet Take 20 mg by mouth 2 (two) times daily.     Marland Kitchen warfarin (COUMADIN) 1 MG tablet Take  3 mg by mouth at bedtime.     Marland Kitchen zinc gluconate 50 MG tablet Take 50 mg by mouth daily.      No current facility-administered medications for this visit.   Allergies  Allergen Reactions  . Ace Inhibitors Cough  . Armodafinil Other (See Comments)    headache   . Oxycodone Other (See Comments)    hallucinations   . Rosuvastatin Other (See Comments)    Muscles aches   . Statins Other (See Comments)    Muscle ache    Social History   Tobacco Use  . Smoking status: Never Smoker  . Smokeless tobacco: Never Used  . Tobacco comment: father smoked in the home while pt was growing up//ex-wife smoked  Substance Use Topics  . Alcohol use: No    Family History  Problem Relation Age of Onset  . Heart disease Mother   . Heart attack Father   . Heart attack Brother        died of an MI after total knee  . Heart disease Brother        multiple bypass surgery     Review of Systems  HENT: Positive for hearing loss.   Respiratory: Positive for cough and shortness of breath.   Genitourinary: Positive for dysuria and hematuria.  Musculoskeletal: Positive for arthralgias and joint swelling.  Hematological: Bruises/bleeds easily.  All other systems reviewed and are negative.   Objective:  Physical Exam Constitutional:      General: He is not in acute distress.    Appearance: Normal appearance.  HENT:     Head: Normocephalic and atraumatic.  Eyes:     Extraocular Movements: Extraocular movements intact.     Pupils: Pupils are equal, round, and reactive to light.  Cardiovascular:     Rate and Rhythm: Normal rate. Rhythm irregular.     Pulses: Normal pulses.  Pulmonary:     Effort: Pulmonary effort is normal. No respiratory distress.     Breath sounds: Normal breath sounds.  Abdominal:     General: Abdomen is flat. Bowel sounds are normal. There is  no distension.     Palpations: Abdomen is soft.     Tenderness: There is no abdominal tenderness.  Musculoskeletal:     Cervical back: Normal range of motion and neck supple.     Right knee: Swelling and bony tenderness present. Decreased range of motion. Tenderness present.  Lymphadenopathy:     Cervical: No cervical adenopathy.  Skin:    General: Skin is warm and dry.     Findings: No erythema or rash.  Neurological:     General: No focal deficit present.     Mental Status: He is alert and oriented to person, place, and time.  Psychiatric:        Mood and Affect: Mood normal.        Behavior: Behavior normal.     Vital signs in last 24 hours: @VSRANGES @  Labs:   Estimated body mass index is 32.26 kg/m as calculated from the following:   Height as of 06/04/20: 5\' 7"  (1.702 m).   Weight as of 06/04/20: 93.4 kg.   Imaging Review Plain radiographs demonstrate moderate degenerative joint disease of the right knee(s). The overall alignment ismild varus. The bone quality appears to be good for age and reported activity level.      Assessment/Plan:  End stage arthritis, right knee   The patient history, physical examination, clinical judgment of the provider and imaging studies  are consistent with end stage degenerative joint disease of the right knee(s) and total knee arthroplasty is deemed medically necessary. The treatment options including medical management, injection therapy arthroscopy and arthroplasty were discussed at length. The risks and benefits of total knee arthroplasty were presented and reviewed. The risks due to aseptic loosening, infection, stiffness, patella tracking problems, thromboembolic complications and other imponderables were discussed. The patient acknowledged the explanation, agreed to proceed with the plan and consent was signed. Patient is being admitted for inpatient treatment for surgery, pain control, PT, OT, prophylactic antibiotics, VTE  prophylaxis, progressive ambulation and ADL's and discharge planning. The patient is planning to be discharged home with home health services    Anticipated LOS equal to or greater than 2 midnights due to - Age 57 and older with one or more of the following:  - Obesity  - Expected need for hospital services (PT, OT, Nursing) required for safe  discharge  - Anticipated need for postoperative skilled nursing care or inpatient rehab  - Active co-morbidities: Diabetes and Cardiac Arrhythmia OR   - Unanticipated findings during/Post Surgery: None  - Patient is a high risk of re-admission due to: None

## 2020-06-07 NOTE — H&P (View-Only) (Signed)
TOTAL KNEE ADMISSION H&P  Patient is being admitted for right total knee arthroplasty.  Subjective:  Chief Complaint:right knee pain.  HPI: Chase Keller, 80 y.o. male, has a history of pain and functional disability in the right knee due to arthritis and has failed non-surgical conservative treatments for greater than 12 weeks to includeNSAID's and/or analgesics, corticosteriod injections and activity modification.  Onset of symptoms was gradual, starting >10 years ago with gradually worsening course since that time. The patient noted no past surgery on the right knee(s).  Patient currently rates pain in the right knee(s) at 9 out of 10 with activity. Patient has night pain, worsening of pain with activity and weight bearing, pain that interferes with activities of daily living, pain with passive range of motion, crepitus and joint swelling.  Patient has evidence of periarticular osteophytes and joint space narrowing by imaging studies.  There is no active infection.  Patient Active Problem List   Diagnosis Date Noted  . Pain due to onychomycosis of toenails of both feet 11/08/2018  . Pincer nail deformity 11/08/2018  . Coagulation disorder ([place]) 11/08/2018  . Diabetes mellitus type 2, diet-controlled ([place]) 06/29/2012  . Atrial fibrillation ([place])   . Elevated fasting glucose 02/25/2012  . Long term (current) use of anticoagulants 02/25/2012  . CHF (congestive heart failure) ([place]) 02/24/2012  . Cough 02/24/2012  . Shortness of breath 02/24/2012  . Incomplete right bundle branch block 10/14/2011  . Leukocytosis 10/14/2011  . Bradycardia 10/13/2011  . Left knee DJD   . Coronary artery disease   . Stented coronary artery   . S/P coronary artery bypass graft x 2   . Obesity (BMI 30-39.9) 09/01/2011  . Hypertension 04/06/2011  . Obstructive sleep apnea 04/04/2011  . Hypothyroidism 03/29/2011  . Hyperlipidemia 03/29/2011   Past Medical History:  Diagnosis Date  . Agent orange  exposure   . Atrial fibrillation ([place]) 2013  . Cancer ([place])    bladder  . CHF (congestive heart failure) ([place])   . COPD (chronic obstructive pulmonary disease) ([place])   . Coronary artery disease   . Diabetes mellitus without complication (HCC)    diet controlled  . Dysrhythmia     A fib  . Heart murmur    / 1 MD said yes, 1 said no  . History of blood transfusion    during CABG  . History of kidney stones   . Hyperlipidemia   . Hypertension   . Left knee DJD   . MRSA (methicillin resistant staph aureus) culture positive    Drainage from Left Knee approx 2005  . Myocardial infarction ([place])    mild in 1998 per Wife   . PONV (postoperative nausea and vomiting)   . S/P coronary artery bypass graft x 2    1998  . Shortness of breath   . Sleep apnea    CPAP  . Stented coronary artery     Past Surgical History:  Procedure Laterality Date  . Bone spurs     Back  . Cardiac stents  2005  . CORONARY ARTERY BYPASS GRAFT  1988   2 vessel,  followed by [facility]  . NASAL SINUS SURGERY     Due to sinus "impaction"  . PTCA  2005   [name]  . skin moles biopsied    . TOTAL KNEE ARTHROPLASTY  10/13/2011   Procedure: TOTAL KNEE ARTHROPLASTY;  Surgeon: Lorn Junes, MD;  Location: [facility];  Service: Orthopedics;  Laterality: Left;  left  total knee arthroplasty    Current Outpatient Medications  Medication Sig Dispense Refill Last Dose  . ACAI BERRY PO Take 3,000 mg by mouth daily.     Marland Kitchen albuterol (VENTOLIN HFA) 108 (90 Base) MCG/ACT inhaler Inhale 2 puffs into the lungs every 6 (six) hours as needed for wheezing or shortness of breath.     Marland Kitchen ALFALFA PO Take 1,300 mg by mouth 2 (two) times daily.     Marland Kitchen allopurinol (ZYLOPRIM) 300 MG tablet Take 300 mg by mouth daily.     Marland Kitchen amLODipine (NORVASC) 2.5 MG tablet Take 2.5 mg by mouth daily as needed (If systolic BP over 562.).     Marland Kitchen ammonium lactate (LAC-HYDRIN) 12 % lotion Apply 1 application topically daily as needed for dry skin.     .  APPLE CIDER VINEGAR PO Take 480 mg by mouth daily.     . Ascorbic Acid (VITAMIN C) 1000 MG tablet Take 1,000 mg by mouth daily.     Marland Kitchen aspirin EC 81 MG tablet Take 81 mg by mouth daily.     . Calcium Carb-Cholecalciferol (CALCIUM/VITAMIN D) 600-400 MG-UNIT TABS Take 1 tablet by mouth 2 (two) times daily.     . carvedilol (COREG) 6.25 MG tablet Take by mouth 2 (two) times daily with a meal.     . Cholecalciferol (VITAMIN D3) 250 MCG (10000 UT) capsule Take 10,000 Units by mouth daily.     . Cinnamon 500 MG capsule Take 500 mg by mouth daily.     . Coenzyme Q10 200 MG capsule Take 200 mg by mouth daily.     . cyanocobalamin 1000 MCG tablet Take 1,000 mcg by mouth daily.     Marland Kitchen ezetimibe (ZETIA) 10 MG tablet Take 10 mg by mouth daily.     . ferrous sulfate 325 (65 FE) MG tablet Take 325 mg by mouth daily with breakfast.     . fluticasone (FLONASE) 50 MCG/ACT nasal spray Place 2 sprays into both nostrils daily as needed for rhinitis.     . Glucosamine-Chondroit-Vit C-Mn (GLUCOSAMINE CHONDR 500 COMPLEX) CAPS Take 2 capsules by mouth 2 (two) times daily.     . niacin 500 MG CR capsule Take 500 mg by mouth at bedtime. B3     . Potassium 99 MG TABS Take 99 mg by mouth 2 (two) times daily.     Marland Kitchen pyridOXINE (B-6) 50 MG tablet Take 100 mg by mouth daily.     . RED YEAST RICE EXTRACT PO Take 600 mg by mouth 2 (two) times daily.     . riboflavin (VITAMIN B-2) 100 MG TABS tablet Take 100 mg by mouth daily.     Marland Kitchen senna-docusate (SENOKOT-S) 8.6-50 MG tablet Take 1 tablet by mouth 2 (two) times daily.     . Skin Protectants, Misc. (HYDROCERIN EX) Apply 1 application topically daily as needed (dry skin).     Marland Kitchen spironolactone (ALDACTONE) 25 MG tablet Take 12.5 mg by mouth daily.     . tamsulosin (FLOMAX) 0.4 MG CAPS capsule Take 0.4 mg by mouth at bedtime.     . thiamine 100 MG tablet Take 100 mg by mouth daily.     . traMADol (ULTRAM) 50 MG tablet Take 50 mg by mouth every 6 (six) hours as needed for moderate  pain.     . trospium (SANCTURA) 20 MG tablet Take 20 mg by mouth 2 (two) times daily.     Marland Kitchen warfarin (COUMADIN) 1 MG tablet Take  3 mg by mouth at bedtime.     Marland Kitchen zinc gluconate 50 MG tablet Take 50 mg by mouth daily.      No current facility-administered medications for this visit.   Allergies  Allergen Reactions  . Ace Inhibitors Cough  . Armodafinil Other (See Comments)    headache   . Oxycodone Other (See Comments)    hallucinations   . Rosuvastatin Other (See Comments)    Muscles aches   . Statins Other (See Comments)    Muscle ache    Social History   Tobacco Use  . Smoking status: Never Smoker  . Smokeless tobacco: Never Used  . Tobacco comment: father smoked in the home while pt was growing up//ex-wife smoked  Substance Use Topics  . Alcohol use: No    Family History  Problem Relation Age of Onset  . Heart disease Mother   . Heart attack Father   . Heart attack Brother        died of an MI after total knee  . Heart disease Brother        multiple bypass surgery     Review of Systems  HENT: Positive for hearing loss.   Respiratory: Positive for cough and shortness of breath.   Genitourinary: Positive for dysuria and hematuria.  Musculoskeletal: Positive for arthralgias and joint swelling.  Hematological: Bruises/bleeds easily.  All other systems reviewed and are negative.   Objective:  Physical Exam Constitutional:      General: Chase Keller is not in acute distress.    Appearance: Normal appearance.  HENT:     Head: Normocephalic and atraumatic.  Eyes:     Extraocular Movements: Extraocular movements intact.     Pupils: Pupils are equal, round, and reactive to light.  Cardiovascular:     Rate and Rhythm: Normal rate. Rhythm irregular.     Pulses: Normal pulses.  Pulmonary:     Effort: Pulmonary effort is normal. No respiratory distress.     Breath sounds: Normal breath sounds.  Abdominal:     General: Abdomen is flat. Bowel sounds are normal. There is  no distension.     Palpations: Abdomen is soft.     Tenderness: There is no abdominal tenderness.  Musculoskeletal:     Cervical back: Normal range of motion and neck supple.     Right knee: Swelling and bony tenderness present. Decreased range of motion. Tenderness present.  Lymphadenopathy:     Cervical: No cervical adenopathy.  Skin:    General: Skin is warm and dry.     Findings: No erythema or rash.  Neurological:     General: No focal deficit present.     Mental Status: Chase Keller is alert and oriented to person, place, and time.  Psychiatric:        Mood and Affect: Mood normal.        Behavior: Behavior normal.     Vital signs in last 24 hours: @VSRANGES @  Labs:   Estimated body mass index is 32.26 kg/m as calculated from the following:   Height as of 06/04/20: 5\' 7"  (1.702 m).   Weight as of 06/04/20: 93.4 kg.   Imaging Review Plain radiographs demonstrate moderate degenerative joint disease of the right knee(s). The overall alignment ismild varus. The bone quality appears to be good for age and reported activity level.      Assessment/Plan:  End stage arthritis, right knee   The patient history, physical examination, clinical judgment of the provider and imaging studies  are consistent with end stage degenerative joint disease of the right knee(s) and total knee arthroplasty is deemed medically necessary. The treatment options including medical management, injection therapy arthroscopy and arthroplasty were discussed at length. The risks and benefits of total knee arthroplasty were presented and reviewed. The risks due to aseptic loosening, infection, stiffness, patella tracking problems, thromboembolic complications and other imponderables were discussed. The patient acknowledged the explanation, agreed to proceed with the plan and consent was signed. Patient is being admitted for inpatient treatment for surgery, pain control, PT, OT, prophylactic antibiotics, VTE  prophylaxis, progressive ambulation and ADL's and discharge planning. The patient is planning to be discharged home with home health services    Anticipated LOS equal to or greater than 2 midnights due to - Age 57 and older with one or more of the following:  - Obesity  - Expected need for hospital services (PT, OT, Nursing) required for safe  discharge  - Anticipated need for postoperative skilled nursing care or inpatient rehab  - Active co-morbidities: Diabetes and Cardiac Arrhythmia OR   - Unanticipated findings during/Post Surgery: None  - Patient is a high risk of re-admission due to: None

## 2020-06-08 ENCOUNTER — Encounter (HOSPITAL_COMMUNITY): Payer: Self-pay | Admitting: Orthopedic Surgery

## 2020-06-08 ENCOUNTER — Ambulatory Visit (HOSPITAL_COMMUNITY): Payer: Medicare PPO | Admitting: Anesthesiology

## 2020-06-08 ENCOUNTER — Encounter (HOSPITAL_COMMUNITY)
Admission: RE | Disposition: A | Discharge status: Home / Self Care | Payer: Self-pay | Source: Home / Self Care | Attending: Orthopedic Surgery

## 2020-06-08 ENCOUNTER — Ambulatory Visit (HOSPITAL_COMMUNITY)
Admission: RE | Admit: 2020-06-08 | Discharge: 2020-06-09 | Disposition: A | Discharge status: Home / Self Care | Payer: Medicare PPO | Attending: Orthopedic Surgery | Admitting: Orthopedic Surgery

## 2020-06-08 ENCOUNTER — Other Ambulatory Visit: Payer: Self-pay

## 2020-06-08 ENCOUNTER — Ambulatory Visit (HOSPITAL_COMMUNITY): Payer: Medicare PPO | Admitting: Physician Assistant

## 2020-06-08 DIAGNOSIS — E669 Obesity, unspecified: Secondary | ICD-10-CM | POA: Insufficient documentation

## 2020-06-08 DIAGNOSIS — Z7901 Long term (current) use of anticoagulants: Secondary | ICD-10-CM | POA: Insufficient documentation

## 2020-06-08 DIAGNOSIS — Z7982 Long term (current) use of aspirin: Secondary | ICD-10-CM | POA: Diagnosis not present

## 2020-06-08 DIAGNOSIS — M1711 Unilateral primary osteoarthritis, right knee: Secondary | ICD-10-CM | POA: Diagnosis present

## 2020-06-08 DIAGNOSIS — M25761 Osteophyte, right knee: Secondary | ICD-10-CM | POA: Insufficient documentation

## 2020-06-08 DIAGNOSIS — M25461 Effusion, right knee: Secondary | ICD-10-CM | POA: Insufficient documentation

## 2020-06-08 DIAGNOSIS — Z96652 Presence of left artificial knee joint: Secondary | ICD-10-CM | POA: Diagnosis not present

## 2020-06-08 DIAGNOSIS — Z79899 Other long term (current) drug therapy: Secondary | ICD-10-CM | POA: Insufficient documentation

## 2020-06-08 DIAGNOSIS — I10 Essential (primary) hypertension: Secondary | ICD-10-CM | POA: Insufficient documentation

## 2020-06-08 DIAGNOSIS — Z951 Presence of aortocoronary bypass graft: Secondary | ICD-10-CM | POA: Diagnosis not present

## 2020-06-08 DIAGNOSIS — Z6832 Body mass index (BMI) 32.0-32.9, adult: Secondary | ICD-10-CM | POA: Insufficient documentation

## 2020-06-08 DIAGNOSIS — I482 Chronic atrial fibrillation, unspecified: Secondary | ICD-10-CM

## 2020-06-08 DIAGNOSIS — I252 Old myocardial infarction: Secondary | ICD-10-CM | POA: Diagnosis not present

## 2020-06-08 DIAGNOSIS — M21161 Varus deformity, not elsewhere classified, right knee: Secondary | ICD-10-CM | POA: Insufficient documentation

## 2020-06-08 DIAGNOSIS — Z885 Allergy status to narcotic agent status: Secondary | ICD-10-CM | POA: Insufficient documentation

## 2020-06-08 DIAGNOSIS — M24561 Contracture, right knee: Secondary | ICD-10-CM | POA: Diagnosis not present

## 2020-06-08 DIAGNOSIS — Z8249 Family history of ischemic heart disease and other diseases of the circulatory system: Secondary | ICD-10-CM | POA: Insufficient documentation

## 2020-06-08 DIAGNOSIS — Z888 Allergy status to other drugs, medicaments and biological substances status: Secondary | ICD-10-CM | POA: Diagnosis not present

## 2020-06-08 DIAGNOSIS — Z955 Presence of coronary angioplasty implant and graft: Secondary | ICD-10-CM | POA: Diagnosis not present

## 2020-06-08 DIAGNOSIS — E119 Type 2 diabetes mellitus without complications: Secondary | ICD-10-CM | POA: Diagnosis not present

## 2020-06-08 HISTORY — PX: TOTAL KNEE ARTHROPLASTY: SHX125

## 2020-06-08 LAB — CBC
HCT: 44 % (ref 39.0–52.0)
Hemoglobin: 13.7 g/dL (ref 13.0–17.0)
MCH: 29.7 pg (ref 26.0–34.0)
MCHC: 31.1 g/dL (ref 30.0–36.0)
MCV: 95.4 fL (ref 80.0–100.0)
Platelets: 135 10*3/uL — ABNORMAL LOW (ref 150–400)
RBC: 4.61 MIL/uL (ref 4.22–5.81)
RDW: 15.9 % — ABNORMAL HIGH (ref 11.5–15.5)
WBC: 8.9 10*3/uL (ref 4.0–10.5)
nRBC: 0 % (ref 0.0–0.2)

## 2020-06-08 LAB — APTT: aPTT: 35 seconds (ref 24–36)

## 2020-06-08 LAB — PROTIME-INR
INR: 1.5 — ABNORMAL HIGH (ref 0.8–1.2)
INR: 1.5 — ABNORMAL HIGH (ref 0.8–1.2)
Prothrombin Time: 17.6 seconds — ABNORMAL HIGH (ref 11.4–15.2)
Prothrombin Time: 17.3 seconds — ABNORMAL HIGH (ref 11.4–15.2)

## 2020-06-08 LAB — CREATININE, SERUM
Creatinine, Ser: 1.05 mg/dL (ref 0.61–1.24)
GFR, Estimated: >60 mL/min (ref >60)

## 2020-06-08 LAB — GLUCOSE, CAPILLARY: Glucose-Capillary: 114 mg/dL — ABNORMAL HIGH (ref 70–99)

## 2020-06-08 SURGERY — ARTHROPLASTY, KNEE, TOTAL
Anesthesia: Spinal | Site: Knee | Laterality: Right

## 2020-06-08 MED ORDER — LACTATED RINGERS IV SOLN: INTRAVENOUS | Status: DC

## 2020-06-08 MED ORDER — ALLOPURINOL 300 MG PO TABS
300.0000 mg | ORAL_TABLET | Freq: Every day | ORAL | Status: DC
  Administered 2020-06-09: 300 mg via ORAL
  Filled 2020-06-08: qty 1

## 2020-06-08 MED ORDER — ALBUTEROL SULFATE HFA 108 (90 BASE) MCG/ACT IN AERS: 2.0000 | INHALATION_SPRAY | Freq: Four times a day (QID) | RESPIRATORY_TRACT | Status: DC | PRN

## 2020-06-08 MED ORDER — METOCLOPRAMIDE HCL 5 MG PO TABS
5.0000 mg | ORAL_TABLET | Freq: Three times a day (TID) | ORAL | Status: DC | PRN
Start: 2020-06-08 — End: 2020-06-09

## 2020-06-08 MED ORDER — WATER FOR IRRIGATION, STERILE IR SOLN
Status: DC | PRN
  Administered 2020-06-08: 2000 mL

## 2020-06-08 MED ORDER — EZETIMIBE 10 MG PO TABS
10.0000 mg | ORAL_TABLET | Freq: Every day | ORAL | Status: DC
  Administered 2020-06-09: 10 mg via ORAL
  Filled 2020-06-08: qty 1

## 2020-06-08 MED ORDER — PHENOL 1.4 % MT LIQD: 1.0000 | OROMUCOSAL | Status: DC | PRN

## 2020-06-08 MED ORDER — TRAMADOL HCL 50 MG PO TABS: 50.0000 mg | ORAL_TABLET | Freq: Four times a day (QID) | ORAL | Status: DC | PRN

## 2020-06-08 MED ORDER — ENOXAPARIN SODIUM 40 MG/0.4ML ~~LOC~~ SOLN
40.0000 mg | SUBCUTANEOUS | Status: DC
  Administered 2020-06-09: 40 mg via SUBCUTANEOUS
  Filled 2020-06-08: qty 0.4

## 2020-06-08 MED ORDER — FLEET ENEMA 7-19 GM/118ML RE ENEM: 1.0000 | ENEMA | Freq: Once | RECTAL | Status: DC | PRN

## 2020-06-08 MED ORDER — ONDANSETRON HCL 4 MG/2ML IJ SOLN
INTRAMUSCULAR | Status: DC | PRN
  Administered 2020-06-08: 4 mg via INTRAVENOUS

## 2020-06-08 MED ORDER — TAMSULOSIN HCL 0.4 MG PO CAPS
0.4000 mg | ORAL_CAPSULE | Freq: Every day | ORAL | Status: DC
  Administered 2020-06-08: 0.4 mg via ORAL
  Filled 2020-06-08: qty 1

## 2020-06-08 MED ORDER — PROPOFOL 1000 MG/100ML IV EMUL
INTRAVENOUS | Status: AC
  Filled 2020-06-08: qty 100

## 2020-06-08 MED ORDER — ONDANSETRON HCL 4 MG/2ML IJ SOLN
INTRAMUSCULAR | Status: AC
  Filled 2020-06-08: qty 2

## 2020-06-08 MED ORDER — AMLODIPINE BESYLATE 5 MG PO TABS: 2.5000 mg | ORAL_TABLET | Freq: Every day | ORAL | Status: DC | PRN

## 2020-06-08 MED ORDER — HYDROCODONE-ACETAMINOPHEN 5-325 MG PO TABS
1.0000 | ORAL_TABLET | ORAL | Status: DC | PRN
  Administered 2020-06-08 – 2020-06-09 (×5): 2 via ORAL
  Filled 2020-06-08 (×5): qty 2

## 2020-06-08 MED ORDER — DEXAMETHASONE SODIUM PHOSPHATE 10 MG/ML IJ SOLN
INTRAMUSCULAR | Status: DC | PRN
  Administered 2020-06-08: 8 mg via INTRAVENOUS

## 2020-06-08 MED ORDER — SODIUM CHLORIDE 0.9% FLUSH
INTRAVENOUS | Status: DC | PRN
  Administered 2020-06-08: 50 mL via INTRAVENOUS

## 2020-06-08 MED ORDER — SPIRONOLACTONE 12.5 MG HALF TABLET
12.5000 mg | ORAL_TABLET | Freq: Every day | ORAL | Status: DC
  Administered 2020-06-08 – 2020-06-09 (×2): 12.5 mg via ORAL
  Filled 2020-06-08 (×2): qty 1

## 2020-06-08 MED ORDER — ORAL CARE MOUTH RINSE: 15.0000 mL | Freq: Once | OROMUCOSAL | Status: AC

## 2020-06-08 MED ORDER — POTASSIUM 99 MG PO TABS: 99.0000 mg | ORAL_TABLET | Freq: Every day | ORAL | Status: DC

## 2020-06-08 MED ORDER — HYDROMORPHONE HCL 1 MG/ML IJ SOLN
0.5000 mg | INTRAMUSCULAR | Status: DC | PRN
  Administered 2020-06-08: 0.5 mg via INTRAVENOUS
  Filled 2020-06-08: qty 0.5

## 2020-06-08 MED ORDER — ACETAMINOPHEN 325 MG PO TABS: 325.0000 mg | ORAL_TABLET | Freq: Four times a day (QID) | ORAL | Status: DC | PRN

## 2020-06-08 MED ORDER — CEFAZOLIN SODIUM-DEXTROSE 2-4 GM/100ML-% IV SOLN
2.0000 g | INTRAVENOUS | Status: AC
  Administered 2020-06-08: 2 g via INTRAVENOUS
  Filled 2020-06-08: qty 100

## 2020-06-08 MED ORDER — THIAMINE HCL 100 MG PO TABS
100.0000 mg | ORAL_TABLET | Freq: Every day | ORAL | Status: DC
  Administered 2020-06-09: 100 mg via ORAL
  Filled 2020-06-08: qty 1

## 2020-06-08 MED ORDER — ROPIVACAINE HCL 7.5 MG/ML IJ SOLN
INTRAMUSCULAR | Status: DC | PRN
  Administered 2020-06-08: 20 mL via PERINEURAL

## 2020-06-08 MED ORDER — METOCLOPRAMIDE HCL 5 MG/ML IJ SOLN: 5.0000 mg | Freq: Three times a day (TID) | INTRAMUSCULAR | Status: DC | PRN

## 2020-06-08 MED ORDER — ONDANSETRON HCL 4 MG PO TABS: 4.0000 mg | ORAL_TABLET | Freq: Four times a day (QID) | ORAL | Status: DC | PRN

## 2020-06-08 MED ORDER — CARVEDILOL 6.25 MG PO TABS
6.2500 mg | ORAL_TABLET | Freq: Once | ORAL | Status: AC
  Administered 2020-06-08: 6.25 mg via ORAL
  Filled 2020-06-08: qty 1

## 2020-06-08 MED ORDER — EPHEDRINE 5 MG/ML INJ
INTRAVENOUS | Status: AC
  Filled 2020-06-08: qty 10

## 2020-06-08 MED ORDER — PROPOFOL 500 MG/50ML IV EMUL
INTRAVENOUS | Status: DC | PRN
  Administered 2020-06-08: 80 ug/kg/min via INTRAVENOUS

## 2020-06-08 MED ORDER — CLONIDINE HCL (ANALGESIA) 100 MCG/ML EP SOLN
EPIDURAL | Status: DC | PRN
  Administered 2020-06-08: 100 ug

## 2020-06-08 MED ORDER — BUPIVACAINE-EPINEPHRINE (PF) 0.25% -1:200000 IJ SOLN
INTRAMUSCULAR | Status: AC
  Filled 2020-06-08: qty 30

## 2020-06-08 MED ORDER — CEFAZOLIN SODIUM-DEXTROSE 1-4 GM/50ML-% IV SOLN
1.0000 g | Freq: Four times a day (QID) | INTRAVENOUS | Status: AC
  Administered 2020-06-08 (×2): 1 g via INTRAVENOUS
  Filled 2020-06-08 (×2): qty 50

## 2020-06-08 MED ORDER — TRANEXAMIC ACID-NACL 1000-0.7 MG/100ML-% IV SOLN
1000.0000 mg | INTRAVENOUS | Status: AC
  Administered 2020-06-08: 1000 mg via INTRAVENOUS
  Filled 2020-06-08: qty 100

## 2020-06-08 MED ORDER — EPHEDRINE SULFATE-NACL 50-0.9 MG/10ML-% IV SOSY
PREFILLED_SYRINGE | INTRAVENOUS | Status: DC | PRN
  Administered 2020-06-08 (×2): 10 mg via INTRAVENOUS

## 2020-06-08 MED ORDER — CHLORHEXIDINE GLUCONATE 0.12 % MT SOLN
15.0000 mL | Freq: Once | OROMUCOSAL | Status: AC
  Administered 2020-06-08: 15 mL via OROMUCOSAL

## 2020-06-08 MED ORDER — SODIUM CHLORIDE 0.9 % IR SOLN
Status: DC | PRN
  Administered 2020-06-08: 1000 mL

## 2020-06-08 MED ORDER — TRANEXAMIC ACID 1000 MG/10ML IV SOLN
2000.0000 mg | INTRAVENOUS | Status: DC
  Filled 2020-06-08: qty 20

## 2020-06-08 MED ORDER — DOCUSATE SODIUM 100 MG PO CAPS
100.0000 mg | ORAL_CAPSULE | Freq: Two times a day (BID) | ORAL | Status: DC
  Administered 2020-06-08 – 2020-06-09 (×2): 100 mg via ORAL
  Filled 2020-06-08 (×2): qty 1

## 2020-06-08 MED ORDER — BUPIVACAINE LIPOSOME 1.3 % IJ SUSP
INTRAMUSCULAR | Status: DC | PRN
  Administered 2020-06-08: 20 mL

## 2020-06-08 MED ORDER — FLUTICASONE PROPIONATE 50 MCG/ACT NA SUSP
2.0000 | Freq: Every day | NASAL | Status: DC | PRN
  Filled 2020-06-08: qty 16

## 2020-06-08 MED ORDER — DEXAMETHASONE SODIUM PHOSPHATE 10 MG/ML IJ SOLN
INTRAMUSCULAR | Status: AC
  Filled 2020-06-08: qty 1

## 2020-06-08 MED ORDER — PROPOFOL 10 MG/ML IV BOLUS
INTRAVENOUS | Status: AC
  Filled 2020-06-08: qty 20

## 2020-06-08 MED ORDER — ONDANSETRON HCL 4 MG/2ML IJ SOLN: 4.0000 mg | Freq: Four times a day (QID) | INTRAMUSCULAR | Status: DC | PRN

## 2020-06-08 MED ORDER — SODIUM CHLORIDE 0.9 % IV SOLN: INTRAVENOUS | Status: DC

## 2020-06-08 MED ORDER — FENTANYL CITRATE (PF) 100 MCG/2ML IJ SOLN
50.0000 ug | INTRAMUSCULAR | Status: DC
  Administered 2020-06-08: 50 ug via INTRAVENOUS
  Filled 2020-06-08: qty 2

## 2020-06-08 MED ORDER — BUPIVACAINE-EPINEPHRINE (PF) 0.25% -1:200000 IJ SOLN
INTRAMUSCULAR | Status: DC | PRN
  Administered 2020-06-08: 30 mL

## 2020-06-08 MED ORDER — TRANEXAMIC ACID 1000 MG/10ML IV SOLN
INTRAVENOUS | Status: DC | PRN
  Administered 2020-06-08: 2000 mg via TOPICAL

## 2020-06-08 MED ORDER — HYDROCODONE-ACETAMINOPHEN 5-325 MG PO TABS: 1.0000 | ORAL_TABLET | ORAL | 0 refills | Status: AC | PRN

## 2020-06-08 MED ORDER — FERROUS SULFATE 325 (65 FE) MG PO TABS
325.0000 mg | ORAL_TABLET | Freq: Every day | ORAL | Status: DC
  Administered 2020-06-09: 325 mg via ORAL
  Filled 2020-06-08: qty 1

## 2020-06-08 MED ORDER — POVIDONE-IODINE 10 % EX SWAB
2.0000 "application " | Freq: Once | CUTANEOUS | Status: AC
  Administered 2020-06-08: 2 via TOPICAL

## 2020-06-08 MED ORDER — BUPIVACAINE LIPOSOME 1.3 % IJ SUSP
20.0000 mL | Freq: Once | INTRAMUSCULAR | Status: DC
  Filled 2020-06-08: qty 20

## 2020-06-08 MED ORDER — SENNOSIDES-DOCUSATE SODIUM 8.6-50 MG PO TABS
1.0000 | ORAL_TABLET | Freq: Two times a day (BID) | ORAL | Status: DC
  Administered 2020-06-08 – 2020-06-09 (×2): 1 via ORAL
  Filled 2020-06-08 (×2): qty 1

## 2020-06-08 MED ORDER — MENTHOL 3 MG MT LOZG: 1.0000 | LOZENGE | OROMUCOSAL | Status: DC | PRN

## 2020-06-08 MED ORDER — TRANEXAMIC ACID-NACL 1000-0.7 MG/100ML-% IV SOLN
1000.0000 mg | Freq: Once | INTRAVENOUS | Status: AC
  Administered 2020-06-08: 1000 mg via INTRAVENOUS
  Filled 2020-06-08: qty 100

## 2020-06-08 MED ORDER — CARVEDILOL 6.25 MG PO TABS
6.2500 mg | ORAL_TABLET | Freq: Two times a day (BID) | ORAL | Status: DC
  Administered 2020-06-08 – 2020-06-09 (×2): 6.25 mg via ORAL
  Filled 2020-06-08 (×2): qty 1

## 2020-06-08 MED ORDER — ACETAMINOPHEN 500 MG PO TABS
500.0000 mg | ORAL_TABLET | Freq: Four times a day (QID) | ORAL | Status: DC
  Administered 2020-06-08 – 2020-06-09 (×2): 500 mg via ORAL
  Filled 2020-06-08 (×3): qty 1

## 2020-06-08 SURGICAL SUPPLY — 61 items
ATTUNE MED DOME PAT 41 KNEE (Knees) ×2 IMPLANT
ATTUNE PS FEM RT SZ 5 CEM KNEE (Femur) ×2 IMPLANT
ATTUNE PSRP INSR SZ5 5 KNEE (Insert) ×2 IMPLANT
BAG DECANTER FOR FLEXI CONT (MISCELLANEOUS) ×2 IMPLANT
BAG ZIPLOCK 12X15 (MISCELLANEOUS) ×2 IMPLANT
BASE TIBIA ATTUNE KNEE SYS SZ6 (Knees) ×1 IMPLANT
BENZOIN TINCTURE PRP APPL 2/3 (GAUZE/BANDAGES/DRESSINGS) ×2 IMPLANT
BLADE SAGITTAL 25.0X1.19X90 (BLADE) ×2 IMPLANT
BLADE SAW SGTL 13X75X1.27 (BLADE) ×2 IMPLANT
BLADE SURG 15 STRL LF DISP TIS (BLADE) ×1 IMPLANT
BLADE SURG 15 STRL SS (BLADE) ×1
BLADE SURG SZ10 CARB STEEL (BLADE) ×2 IMPLANT
BNDG ELASTIC 6X15 VLCR STRL LF (GAUZE/BANDAGES/DRESSINGS) ×2 IMPLANT
BOWL SMART MIX CTS (DISPOSABLE) ×2 IMPLANT
CEMENT HV SMART SET (Cement) ×4 IMPLANT
CLSR STERI-STRIP ANTIMIC 1/2X4 (GAUZE/BANDAGES/DRESSINGS) ×4 IMPLANT
COVER SURGICAL LIGHT HANDLE (MISCELLANEOUS) ×2 IMPLANT
COVER WAND RF STERILE (DRAPES) ×2 IMPLANT
CUFF TOURN SGL QUICK 34 (TOURNIQUET CUFF) ×1
CUFF TRNQT CYL 34X4.125X (TOURNIQUET CUFF) ×1 IMPLANT
DECANTER SPIKE VIAL GLASS SM (MISCELLANEOUS) ×4 IMPLANT
DRAPE INCISE IOBAN 66X45 STRL (DRAPES) IMPLANT
DRAPE U-SHAPE 47X51 STRL (DRAPES) ×2 IMPLANT
DRESSING AQUACEL AG SP 3.5X10 (GAUZE/BANDAGES/DRESSINGS) ×1 IMPLANT
DRSG AQUACEL AG ADV 3.5X10 (GAUZE/BANDAGES/DRESSINGS) ×2 IMPLANT
DRSG AQUACEL AG SP 3.5X10 (GAUZE/BANDAGES/DRESSINGS) ×2
DURAPREP 26ML APPLICATOR (WOUND CARE) ×4 IMPLANT
ELECT REM PT RETURN 15FT ADLT (MISCELLANEOUS) ×2 IMPLANT
GLOVE SRG 8 PF TXTR STRL LF DI (GLOVE) ×2 IMPLANT
GLOVE SURG ORTHO LTX SZ8 (GLOVE) ×2 IMPLANT
GLOVE SURG SS PI 7.5 STRL IVOR (GLOVE) ×2 IMPLANT
GLOVE SURG UNDER POLY LF SZ8 (GLOVE) ×2
GOWN STRL REUS W/TWL XL LVL3 (GOWN DISPOSABLE) ×4 IMPLANT
HANDPIECE INTERPULSE COAX TIP (DISPOSABLE) ×1
HOLDER FOLEY CATH W/STRAP (MISCELLANEOUS) IMPLANT
HOOD PEEL AWAY FLYTE STAYCOOL (MISCELLANEOUS) ×2 IMPLANT
IMMOBILIZER KNEE 20 (SOFTGOODS) ×2
IMMOBILIZER KNEE 20 THIGH 36 (SOFTGOODS) ×1 IMPLANT
KIT TURNOVER KIT A (KITS) ×2 IMPLANT
MANIFOLD NEPTUNE II (INSTRUMENTS) ×2 IMPLANT
NEEDLE HYPO 22GX1.5 SAFETY (NEEDLE) ×4 IMPLANT
NS IRRIG 1000ML POUR BTL (IV SOLUTION) ×2 IMPLANT
PACK ICE MAXI GEL EZY WRAP (MISCELLANEOUS) ×2 IMPLANT
PACK TOTAL KNEE CUSTOM (KITS) ×2 IMPLANT
PENCIL SMOKE EVACUATOR (MISCELLANEOUS) IMPLANT
PIN DRILL FIX HALF THREAD (BIT) ×2 IMPLANT
PIN STEINMAN FIXATION KNEE (PIN) ×2 IMPLANT
PROTECTOR NERVE ULNAR (MISCELLANEOUS) ×2 IMPLANT
SET HNDPC FAN SPRY TIP SCT (DISPOSABLE) ×1 IMPLANT
STRIP CLOSURE SKIN 1/2X4 (GAUZE/BANDAGES/DRESSINGS) ×2 IMPLANT
SUT ETHIBOND NAB CT1 #1 30IN (SUTURE) ×4 IMPLANT
SUT MNCRL AB 3-0 PS2 18 (SUTURE) ×2 IMPLANT
SUT VIC AB 0 CT1 36 (SUTURE) ×2 IMPLANT
SUT VIC AB 2-0 CT1 27 (SUTURE) ×2
SUT VIC AB 2-0 CT1 TAPERPNT 27 (SUTURE) ×2 IMPLANT
SYR CONTROL 10ML LL (SYRINGE) ×6 IMPLANT
TIBIA ATTUNE KNEE SYS BASE SZ6 (Knees) ×2 IMPLANT
TOWEL OR 17X26 10 PK STRL BLUE (TOWEL DISPOSABLE) ×2 IMPLANT
TRAY FOLEY MTR SLVR 16FR STAT (SET/KITS/TRAYS/PACK) ×2 IMPLANT
WATER STERILE IRR 1000ML POUR (IV SOLUTION) ×4 IMPLANT
WRAP KNEE MAXI GEL POST OP (GAUZE/BANDAGES/DRESSINGS) ×2 IMPLANT

## 2020-06-08 NOTE — Interval H&P Note (Signed)
History and Physical Interval Note:  06/08/2020 9:25 AM  Chase Keller  has presented today for surgery, with the diagnosis of djd right knee.  The various methods of treatment have been discussed with the patient and family. After consideration of risks, benefits and other options for treatment, the patient has consented to  Procedure(s): TOTAL KNEE ARTHROPLASTY (Right) as a surgical intervention.  The patient's history has been reviewed, patient examined, no change in status, stable for surgery.  I have reviewed the patient's chart and labs.  Questions were answered to the patient's satisfaction.     Yvette Rack

## 2020-06-08 NOTE — Progress Notes (Signed)
AssistedDr. Houser with right, ultrasound guided, adductor canal block. Side rails up, monitors on throughout procedure. See vital signs in flow sheet. Tolerated Procedure well.  

## 2020-06-08 NOTE — Progress Notes (Signed)
Orthopedic Tech Progress Note Patient Details:  Chase Keller [birth year] [id number]  Ortho Devices Type of Ortho Device: Bone foam zero knee   Post Interventions Patient Tolerated: Well Instructions Provided: Care of device   [name] 06/08/2020, 4:02 PM

## 2020-06-08 NOTE — Anesthesia Procedure Notes (Signed)
Spinal  Patient location during procedure: OR Start time: 06/08/2020 10:36 AM End time: 06/08/2020 10:40 AM Staffing Performed: resident/CRNA  Resident/CRNA: Niel Hummer, CRNA Preanesthetic Checklist Completed: patient identified, IV checked, risks and benefits discussed, surgical consent, monitors and equipment checked and pre-op evaluation Spinal Block Patient position: sitting Prep: DuraPrep Patient monitoring: heart rate, continuous pulse ox and blood pressure Approach: midline Location: L2-3 Injection technique: single-shot Needle Needle type: Pencan  Needle gauge: 24 G Needle length: 10 cm

## 2020-06-08 NOTE — Plan of Care (Signed)
  Problem: Education: Goal: Knowledge of General Education information will improve Description: Including pain rating scale, medication(s)/side effects and non-pharmacologic comfort measures Outcome: Progressing   Problem: Pain Managment: Goal: General experience of comfort will improve Outcome: Progressing   Problem: Activity: Goal: Risk for activity intolerance will decrease Outcome: Progressing   

## 2020-06-08 NOTE — Anesthesia Procedure Notes (Addendum)
Procedure Name: MAC Date/Time: 06/08/2020 10:27 AM Performed by: Niel Hummer, CRNA Pre-anesthesia Checklist: Emergency Drugs available, Patient identified, Suction available and Patient being monitored Oxygen Delivery Method: Simple face mask

## 2020-06-08 NOTE — Anesthesia Procedure Notes (Addendum)
Anesthesia Regional Block: Adductor canal block   Pre-Anesthetic Checklist: ,, timeout performed, Correct Patient, Correct Site, Correct Laterality, Correct Procedure, Correct Position, site marked, Risks and benefits discussed,  Surgical consent,  Pre-op evaluation,  At surgeon's request and post-op pain management  Laterality: Lower and Right  Prep: chloraprep       Needles:  Injection technique: Single-shot  Needle Type: Echogenic Needle     Needle Length: 9cm  Needle Gauge: 22     Additional Needles:   Procedures:,,,, ultrasound used (permanent image in chart),,,,  Narrative:  Start time: 06/08/2020 9:17 AM End time: 06/08/2020 9:24 AM Injection made incrementally with aspirations every 5 mL.  Performed by: Personally  Anesthesiologist: Barnet Glasgow, MD  Additional Notes: Block assessed prior to surgery. Pt tolerated procedure well.

## 2020-06-08 NOTE — Progress Notes (Signed)
Orthopedic Tech Progress Note Patient Details:  Chase Keller [birth year] [id number]  CPM Right Knee CPM Right Knee: On Right Knee Flexion (Degrees): 90 Right Knee Extension (Degrees): 0  Post Interventions Patient Tolerated: Well Instructions Provided: Care of device  [name] 06/08/2020, 12:53 PM

## 2020-06-08 NOTE — Progress Notes (Signed)
Pharmacy Brief Note  80 y/o M with a history of atrial fibrillation on chronic warfarin admitted for right TKA. Last dose of warfarin was 2/26 per med history. Patient's warfarin is managed by the [facility] with dose of 3 mg daily and therapeutic INR on 2/3. Pharmacy consulted for recommendations for dosing of warfarin, bridging, and f/u after knee surgery.   CHA2DS2/VAS= 6  Lab Results  Component Value Date   INR 1.5 (H) 06/08/2020   INR 2.5 (H) 04/14/2012   INR 2.6 (H) 03/24/2012    CBC Latest Ref Rng & Units 06/04/2020 01/15/2012 01/14/2012  WBC 4.0 - 10.5 K/uL 10.6(H) 11.7(H) 8.6  Hemoglobin 13.0 - 17.0 g/dL 14.9 14.0 13.2  Hematocrit 39.0 - 52.0 % 47.7 43.9 41.3  Platelets 150 - 400 K/uL 161 183 165.0     Would defer to cardiology for need for bridging but with almost 10% annual risk of stroke based on CHA2DS2/VAS=6, would favor bridging with Lovenox 1 mg/kg bid until therapeutic INR and patient has resumed anticoagulation for 5 days (minimum 5 day overlap). Was the patient bridged with Lovenox PTA? Would recommend continuing home dose of warfarin 3 mg daily when surgery deems appropriate to resume anticoagulation. Would recommend checking INR in 1 week.   Thank you for allowing pharmacy to participate in this patient's care.    Ulice Dash D  06/08/20 11:18 AM

## 2020-06-08 NOTE — Transfer of Care (Signed)
Immediate Anesthesia Transfer of Care Note  Patient: Chase Keller  Procedure(s) Performed: TOTAL KNEE ARTHROPLASTY (Right Knee)  Patient Location: PACU  Anesthesia Type:Spinal  Level of Consciousness: awake  Airway & Oxygen Therapy: Patient Spontanous Breathing and Patient connected to face mask oxygen  Post-op Assessment: Report given to RN and Post -op Vital signs reviewed and stable  Post vital signs: Reviewed and stable  Last Vitals:  Vitals Value Taken Time  BP    Temp    Pulse 36 06/08/20 1250  Resp 12 06/08/20 1250  SpO2 98 % 06/08/20 1250  Vitals shown include unvalidated device data.  Last Pain:  Vitals:   06/08/20 0955  TempSrc:   PainSc: 0-No pain      Patients Stated Pain Goal: 3 (59/29/24 4628)  Complications: No complications documented.

## 2020-06-08 NOTE — Discharge Instructions (Signed)

## 2020-06-09 DIAGNOSIS — M1711 Unilateral primary osteoarthritis, right knee: Secondary | ICD-10-CM | POA: Diagnosis not present

## 2020-06-09 LAB — PROTIME-INR
INR: 1.4 — ABNORMAL HIGH (ref 0.8–1.2)
Prothrombin Time: 16.5 seconds — ABNORMAL HIGH (ref 11.4–15.2)

## 2020-06-09 MED ORDER — ENOXAPARIN SODIUM 60 MG/0.6ML ~~LOC~~ SOLN: 1.0000 mg/kg | Freq: Two times a day (BID) | SUBCUTANEOUS | 0 refills | Status: DC

## 2020-06-09 NOTE — Evaluation (Signed)
Physical Therapy Evaluation Patient Details Name: Chase Keller MRN: [id number] DOB: 1940-05-04 Today's Date: 06/09/2020   History of Present Illness  80 y.o. never smoker with h/o PONV, HTN, sleep apnea with CPAP, COPD, DM II diet controlled, CAD (CABG 2006), PAF (on Coumadin) now s/p TOTAL KNEE ARTHROPLASTY (Right).  Clinical Impression  Pt s/p R TKR and presents with decreased R LE strength/ROM and post op pain limiting functional mobility.  Pt should progress to dc home with family assist and HHPT follow up.    Follow Up Recommendations Home health PT    Equipment Recommendations  None recommended by PT    Recommendations for Other Services       Precautions / Restrictions Precautions Precautions: Knee;Fall Required Braces or Orthoses: Knee Immobilizer - Right Restrictions Weight Bearing Restrictions: No RLE Weight Bearing: Weight bearing as tolerated      Mobility  Bed Mobility Overal bed mobility: Needs Assistance Bed Mobility: Sit to Supine     Supine to sit: Min guard;HOB elevated Sit to supine: Min guard   General bed mobility comments: cues for sequence with min guard for R LE    Transfers Overall transfer level: Needs assistance Equipment used: Rolling walker (2 wheeled) Transfers: Sit to/from Stand Sit to Stand: Min guard         General transfer comment: cues for LE management and use of UEs to self assist  Ambulation/Gait Ambulation/Gait assistance: Min assist;Min guard Gait Distance (Feet): 95 Feet Assistive device: Rolling walker (2 wheeled) Gait Pattern/deviations: Step-to pattern;Decreased step length - right;Decreased step length - left;Shuffle;Trunk flexed Gait velocity: decr   General Gait Details: cues for sequence, posture and position from RW; Noted improvement in stability with increased distance  Stairs            Wheelchair Mobility    Modified Rankin (Stroke Patients Only)       Balance Overall balance  assessment: Needs assistance Sitting-balance support: No upper extremity supported;Feet supported Sitting balance-Leahy Scale: Fair     Standing balance support: Bilateral upper extremity supported Standing balance-Leahy Scale: Poor Standing balance comment: reliant on external support                             Pertinent Vitals/Pain Pain Assessment: 0-10 Pain Score: 5  Faces Pain Scale: Hurts little more Pain Location: R knee Pain Descriptors / Indicators: Aching;Sore Pain Intervention(s): Limited activity within patient's tolerance;Monitored during session;Premedicated before session;Ice applied    Home Living Family/patient expects to be discharged to:: Private residence Living Arrangements: Spouse/significant other Available Help at Discharge: Family Type of Home: House Home Access: [place]: One [place]: [place];Shower seat - built in;Bedside commode      Prior Function Level of Independence: Independent               Hand Dominance        Extremity/Trunk Assessment   Upper Extremity Assessment Upper Extremity Assessment: Overall WFL for tasks assessed    Lower Extremity Assessment Lower Extremity Assessment: RLE deficits/detail RLE Deficits / Details: 3/5 quads with IND SLR; AAROM at knee - 5 - 75       Communication   Communication: HOH  Cognition Arousal/Alertness: Awake/alert Behavior During Therapy: WFL for tasks assessed/performed Overall Cognitive Status: Within Functional Limits for tasks assessed  General Comments: tangential responses and some STM deficits noted.      General Comments      Exercises Total Joint Exercises Ankle Circles/Pumps: AROM;Both;20 reps;Supine Quad Sets: AROM;Both;10 reps;Supine Heel Slides: AAROM;Right;15 reps;Supine Straight Leg Raises: AAROM;AROM;Right;10 reps;Supine   Assessment/Plan    PT  Assessment Patient needs continued PT services  PT Problem List Decreased strength;Decreased range of motion;Decreased activity tolerance;Decreased balance;Decreased mobility;Decreased knowledge of use of DME;Pain       PT Treatment Interventions DME instruction;Gait training;Functional mobility training;Therapeutic activities;Therapeutic exercise;Patient/family education    PT Goals (Current goals can be found in the Care Plan section)  Acute Rehab PT Goals Patient Stated Goal: home PT Goal Formulation: With patient Time For Goal Achievement: 06/16/20 Potential to Achieve Goals: Good    Frequency 7X/week   Barriers to discharge        Co-evaluation               AM-PAC PT "6 Clicks" Mobility  Outcome Measure Help needed turning from your back to your side while in a flat bed without using bedrails?: A Little Help needed moving from lying on your back to sitting on the side of a flat bed without using bedrails?: A Little Help needed moving to and from a bed to a chair (including a wheelchair)?: A Little Help needed standing up from a chair using your arms (e.g., wheelchair or bedside chair)?: A Little Help needed to walk in hospital room?: A Little Help needed climbing 3-5 steps with a railing? : A Little 6 Click Score: 18    End of Session Equipment Utilized During Treatment: Gait belt Activity Tolerance: Patient tolerated treatment well Patient left: in bed;with call bell/phone within reach Nurse Communication: Mobility status PT Visit Diagnosis: Difficulty in walking, not elsewhere classified (R26.2)    Time: 7412-8786 PT Time Calculation (min) (ACUTE ONLY): 30 min   Charges:   PT Evaluation $PT Eval Low Complexity: 1 Low PT Treatments $Therapeutic Exercise: 8-22 mins        Debe Coder PT Acute Rehabilitation Services Pager [phone] Office [phone]   Josh Nicolosi 06/09/2020, 12:46 PM

## 2020-06-09 NOTE — Op Note (Signed)
NAME: Chase Keller, Chase Keller MEDICAL RECORD NO: [id number] ACCOUNT NO: [id number] DATE OF BIRTH: October 04, 1940 FAC[facility]: [facility] PHYSICIAN: W D. Valeta Harms., MD  Operative Report   DATE OF PROCEDURE: 06/08/2020  PREOPERATIVE DIAGNOSES:  Severe osteoarthritis, right knee with varus deformity, flexion contracture.  POSTOPERATIVE DIAGNOSES:  Severe osteoarthritis, right knee with varus deformity, flexion contracture.  PROCEDURE:  Right total knee (Attune DePuy knee, cemented size 5 femur, 5 mm size 5 tibial bearing, size 6 tibia with a 41 mm all poly patella.  SURGEON:  W D. Valeta Harms., MD  ASSISTANTMarjo Bicker, PA  TOURNIQUET TIME:  58 minutes.  DESCRIPTION OF PROCEDURE:  Straight skin incision with medial parapatellar approach to the knee made.  We did a 5-degree 10 mm valgus cut on the femur followed by cutting 3 mm below the most diseased medial compartment with moderate stripping the medial  side of the knee due to varus deformity.  The extension gap measured at 5 mm.  Sized the femur to be a size 5, placed an all-in-1 cutting block in appropriate degree of external rotation, accomplishing anterior and posterior chamfer cuts.  PCL was  released as well.  The tibia was sized to be a size 6, placement of the keel cut for the tibia, followed by the tibial trial and the box cut on the femur and the femoral trial.  The patient had resolution of his flexion contracture and the varus  deformity.  Patella was cut for size 41, resecting 9.5 mm.  All trials were placed and all the parameters for stability and range of motion were acceptable.  The final components were inserted tibia followed by femur, patella.  Cement was allowed to  harden.  A trial bearing was removed.  ____ bits of cement were removed from the knee under direct vision.  Tourniquet was released.  No excessive bleeding was noted.  Small bleeders were coagulated.  The final bearing was placed.  Closure was affected  with #1  Ethibond, 2-0 Vicryl, and Monocryl in the skin.  Taken to recovery room in stable condition.   NIK D: 06/08/2020 12:14:51 pm T: 06/09/2020 4:33:00 am  JOB: [id number]/ [id number]

## 2020-06-09 NOTE — Evaluation (Signed)
Occupational Therapy Evaluation Patient Details Name: Chase Keller MRN: [id number] DOB: 03-28-1941 Today's Date: 06/09/2020    History of Present Illness 80 y.o. never smoker with h/o PONV, HTN, sleep apnea with CPAP, COPD, DM II diet controlled, CAD (CABG 2006), PAF (on Coumadin) now s/p TOTAL KNEE ARTHROPLASTY (Right).   Clinical Impression   Pt admitted with the above diagnoses and presents with below problem list. Pt will benefit from continued acute OT to address the below listed deficits and maximize independence with basic ADLs prior to d/c home. 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 Pt currently is min guard A with functional transfers (ie toilet) and mobility, up to min A with LB ADLs. Tolerated session well. Discussed with pt trying to arrange family or outside help for spouse while recovering at home as he is currently limited in what he can safely do at home.      Follow Up Recommendations  Home health OT;Supervision - Intermittent (OOB/mobility)    Equipment Recommendations  None recommended by OT    Recommendations for Other Services PT consult     Precautions / Restrictions Precautions Precautions: Knee;Fall Required Braces or Orthoses: Knee Immobilizer - Right Restrictions Weight Bearing Restrictions: Yes RLE Weight Bearing: Weight bearing as tolerated      Mobility Bed Mobility Overal bed mobility: Needs Assistance Bed Mobility: Supine to Sit     Supine to sit: Min guard;HOB elevated     General bed mobility comments: Pt able to advance RLE across bed with no physical assist. min guard for safety. First time up since surgery    Transfers Overall transfer level: Needs assistance Equipment used: Rolling walker (2 wheeled) Transfers: Sit to/from Stand Sit to Stand: Min guard;From elevated surface         General transfer comment: from EOB with height elevated to match his bed at home, to recliner. Min guard for  safety, first time up since surgery    Balance Overall balance assessment: Needs assistance Sitting-balance support: No upper extremity supported;Feet supported Sitting balance-Leahy Scale: Fair     Standing balance support: Bilateral upper extremity supported Standing balance-Leahy Scale: Poor Standing balance comment: reliant on external support                           ADL either performed or assessed with clinical judgement   ADL Overall ADL's : Needs assistance/impaired Eating/Feeding: Set up;Sitting   Grooming: Set up;Min guard;Sitting;Standing   Upper Body Bathing: Set up;Sitting   Lower Body Bathing: Minimal assistance;Sit to/from stand;Min guard   Upper Body Dressing : Set up;Sitting   Lower Body Dressing: Min guard;Minimal assistance;Sit to/from stand   Toilet Transfer: Min guard;Ambulation;Comfort height toilet;Grab bars;RW   Toileting- Water quality scientist and Hygiene: Min guard;Set up;Sitting/lateral lean;Sit to/from stand   Tub/ Shower Transfer: Walk-in shower;Min guard;Ambulation;Shower seat;Rolling walker   Functional mobility during ADLs: Min guard;Rolling walker General ADL Comments: Pt completed bed mobility, walked around bed to sit up in recliner. Min guard for safety with transfers and mobility. Up to min A with LB ADLs 2/2 decreased access to R foot.     Vision         Perception     Praxis      Pertinent Vitals/Pain Pain Assessment: Faces Faces Pain Scale: Hurts little more Pain Location: RLE walking in the room Pain Descriptors / Indicators: Aching;Sore Pain Intervention(s): Monitored during session     Hand Dominance  Extremity/Trunk Assessment Upper Extremity Assessment Upper Extremity Assessment: Overall WFL for tasks assessed   Lower Extremity Assessment Lower Extremity Assessment: Defer to PT evaluation       Communication Communication Communication: HOH   Cognition Arousal/Alertness:  Awake/alert Behavior During Therapy: WFL for tasks assessed/performed Overall Cognitive Status: Within Functional Limits for tasks assessed                                 General Comments: tangential responses and some STM deficits noted.   General Comments       Exercises     Shoulder Instructions      Home Living Family/patient expects to be discharged to:: Private residence Living Arrangements: Spouse/significant other Available Help at Discharge: Family Type of Home: House Home Access: [place]: One level     Bathroom Shower/Tub: Occupational psychologist: [place];Shower seat - built in;Bedside commode          Prior Functioning/Environment Level of Independence: Independent                 OT Problem List: Impaired balance (sitting and/or standing);Decreased knowledge of use of DME or AE;Decreased knowledge of precautions;Pain      OT Treatment/Interventions: Self-care/ADL training;DME and/or AE instruction;Therapeutic activities;Patient/family education;Balance training    OT Goals(Current goals can be found in the care plan section) Acute Rehab OT Goals Patient Stated Goal: home OT Goal Formulation: With patient Time For Goal Achievement: 06/23/20 Potential to Achieve Goals: Good ADL Goals Pt Will Perform Lower Body Bathing: with modified independence;sit to/from stand Pt Will Perform Lower Body Dressing: with modified independence;sit to/from stand Pt Will Transfer to Toilet: with modified independence;ambulating Pt Will Perform Toileting - Clothing Manipulation and hygiene: with modified independence;sit to/from stand Pt Will Perform Tub/Shower Transfer: Shower transfer;with modified independence;ambulating;rolling walker  OT Frequency: Min 2X/week   Barriers to D/C: Decreased caregiver support  Pt is caregiver 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       Co-evaluation              AM-PAC OT "6 Clicks" Daily Activity     Outcome Measure Help from another person eating meals?: None Help from another person taking care of personal grooming?: None Help from another person toileting, which includes using toliet, bedpan, or urinal?: A Little Help from another person bathing (including washing, rinsing, drying)?: A Little Help from another person to put on and taking off regular upper body clothing?: None Help from another person to put on and taking off regular lower body clothing?: A Little 6 Click Score: 21   End of Session Equipment Utilized During Treatment: Gait belt;Rolling walker;Right knee immobilizer CPM Right Knee CPM Right Knee: Off  Activity Tolerance: Patient tolerated treatment well Patient left: in chair;with call bell/phone within reach;with chair alarm set  OT Visit Diagnosis: Unsteadiness on feet (R26.81);Pain Pain - Right/Left: Right Pain - part of body: Knee                Time: 3825-0539 OT Time Calculation (min): 36 min Charges:  OT General Charges $OT Visit: 1 Visit OT Evaluation $OT Eval Low Complexity: 1 Low OT Treatments $Self Care/Home Management : 8-22 mins  Tyrone Schimke, OT Acute Rehabilitation Services Pager: [phone] Office: [phone]  Hortencia Pilar 06/09/2020, 9:54 AM

## 2020-06-09 NOTE — Plan of Care (Signed)
Patient discharged, all care plans met ° °

## 2020-06-09 NOTE — TOC Progression Note (Signed)
Transition of Care [facility]) - Progression Note    Patient Details  Name: Chase Keller MRN: [id number] Date of Birth: 18-Feb-1941  Transition of Care [facility]) CM/SW Contact  Joaquin Courts, RN Phone Number: 06/09/2020, 10:30 AM  Clinical Narrative:    Plan to dc home with HHPT with [facility], patient reports has rolling walker and 3in1.   Expected Discharge Plan: [place] Barriers to Discharge: No Barriers Identified  Expected Discharge Plan and Services Expected Discharge Plan: [place]   Discharge Planning Services: CM Consult Post Acute Care Choice: [place] arrangements for the past 2 months: Single Family Home Expected Discharge Date: 06/09/20               DME Arranged: N/A DME Agency: NA       HH Arranged: PT HH Agency: [facility] [employer])     Representative spoke with at [place]: pre-arranged in md office   Social Determinants of Health ([place]) Interventions    Readmission Risk Interventions No flowsheet data found.

## 2020-06-09 NOTE — Progress Notes (Signed)
Subjective: 1 Day Post-Op Procedure(s) (LRB): TOTAL KNEE ARTHROPLASTY (Right) Patient reports pain as mild.    Objective: Vital signs in last 24 hours: Temp:  [97.4 F (36.3 C)-98.1 F (36.7 C)] 98.1 F (36.7 C) (03/05 0529) Pulse Rate:  [35-90] 71 (03/05 0529) Resp:  [10-24] 16 (03/05 0529) BP: (96-116)/(45-80) 107/58 (03/05 0529) SpO2:  [93 %-100 %] 95 % (03/05 0529)  Intake/Output from previous day: 03/04 0701 - 03/05 0700 In: 2648.2 [P.O.:458; I.V.:1790.2; IV Piggyback:400] Out: 1 [Urine:1580; Blood:50] Intake/Output this shift: Total I/O In: 300 [P.O.:300] Out: -   Recent Labs    06/08/20 1449  HGB 13.7   Recent Labs    06/08/20 1449  WBC 8.9  RBC 4.61  HCT 44.0  PLT 135*   Recent Labs    06/08/20 1449  CREATININE 1.05   Recent Labs    06/08/20 1449 06/09/20 0313  INR 1.5* 1.4*    Neurovascular intact Sensation intact distally Intact pulses distally Dorsiflexion/Plantar flexion intact Incision: dressing C/D/I   Assessment/Plan: 1 Day Post-Op Procedure(s) (LRB): TOTAL KNEE ARTHROPLASTY (Right) Up with therapy Discharge home with home health WBAT RLE  Spoke with pt regarding anticoagulation, if got very specific instructions from the [place] anticoag clinic states he has a box of lovenox preloaded synringes 7 day supply and instructed to restart his coumadin tomorrow.  Would defer to their recommendations regarding this, but if he has any questions we can help sort them out for him as well.    Jaria Conway 06/09/2020, 10:20 AM

## 2020-06-09 NOTE — Progress Notes (Signed)
Per patient, walker at home. Home health PT set up and patient provided with number

## 2020-06-09 NOTE — Plan of Care (Signed)
  Problem: Education: Goal: Knowledge of General Education information will improve Description Including pain rating scale, medication(s)/side effects and non-pharmacologic comfort measures Outcome: Progressing   Problem: Nutrition: Goal: Adequate nutrition will be maintained Outcome: Progressing   Problem: Pain Managment: Goal: General experience of comfort will improve Outcome: Progressing   

## 2020-06-09 NOTE — Progress Notes (Signed)
Physical Therapy Treatment Patient Details Name: Chase Keller MRN: [id number] DOB: October 02, 1940 Today's Date: 06/09/2020    History of Present Illness 80 y.o. never smoker with h/o PONV, HTN, sleep apnea with CPAP, COPD, DM II diet controlled, CAD (CABG 2006), PAF (on Coumadin) now s/p TOTAL KNEE ARTHROPLASTY (Right).    PT Comments    Pt continues very talkative and highly distractible but progressing well with mobility and eager for dc home.  Pt up to ambulate increased distance in hall with improved stability and performed HEP with written instruction provided and reviewed.   Follow Up Recommendations  Home health PT     Equipment Recommendations  None recommended by PT    Recommendations for Other Services       Precautions / Restrictions Precautions Precautions: Knee;Fall Required Braces or Orthoses: Knee Immobilizer - Right Knee Immobilizer - Right: Discontinue once straight leg raise with < 10 degree lag (Pt performing IND SLR this date) Restrictions Weight Bearing Restrictions: No RLE Weight Bearing: Weight bearing as tolerated    Mobility  Bed Mobility Overal bed mobility: Needs Assistance Bed Mobility: Supine to Sit     Supine to sit: Supervision Sit to supine: Min guard   General bed mobility comments: No physical assist to move to EOB sitting    Transfers Overall transfer level: Needs assistance Equipment used: Rolling walker (2 wheeled) Transfers: Sit to/from Stand Sit to Stand: Supervision         General transfer comment: cues for LE management and use of UEs to self assist  Ambulation/Gait Ambulation/Gait assistance: Min guard;Supervision Gait Distance (Feet): 120 Feet Assistive device: Rolling walker (2 wheeled) Gait Pattern/deviations: Step-to pattern;Decreased step length - right;Decreased step length - left;Shuffle;Trunk flexed Gait velocity: decr   General Gait Details: min cues for sequence, posture and position from RW for  safety   Stairs             Wheelchair Mobility    Modified Rankin (Stroke Patients Only)       Balance Overall balance assessment: Needs assistance Sitting-balance support: No upper extremity supported;Feet supported Sitting balance-Leahy Scale: Good     Standing balance support: No upper extremity supported Standing balance-Leahy Scale: Fair Standing balance comment: reliant on external support                            Cognition Arousal/Alertness: Awake/alert Behavior During Therapy: WFL for tasks assessed/performed Overall Cognitive Status: Within Functional Limits for tasks assessed                                 General Comments: tangential responses and some STM deficits noted.      Exercises Total Joint Exercises Ankle Circles/Pumps: AROM;Both;20 reps;Supine Quad Sets: AROM;Both;10 reps;Supine Heel Slides: AAROM;Right;15 reps;Supine Straight Leg Raises: AAROM;AROM;Right;10 reps;Supine    General Comments        Pertinent Vitals/Pain Pain Assessment: 0-10 Pain Score: 4  Pain Location: R knee Pain Descriptors / Indicators: Aching;Sore Pain Intervention(s): Limited activity within patient's tolerance;Monitored during session;Premedicated before session    [place] expects to be discharged to:: Private residence Living Arrangements: Spouse/significant other Available Help at Discharge: Family Type of Home: House Home Access: [place]: One [place]: [place];Shower seat - built in;Bedside commode      Prior Function Level of Independence: Independent  PT Goals (current goals can now be found in the care plan section) Acute Rehab PT Goals Patient Stated Goal: home PT Goal Formulation: With patient Time For Goal Achievement: 06/16/20 Potential to Achieve Goals: Good Progress towards PT goals: Progressing toward goals    Frequency     7X/week      PT Plan Current plan remains appropriate    Co-evaluation              AM-PAC PT "6 Clicks" Mobility   Outcome Measure  Help needed turning from your back to your side while in a flat bed without using bedrails?: A Little Help needed moving from lying on your back to sitting on the side of a flat bed without using bedrails?: A Little Help needed moving to and from a bed to a chair (including a wheelchair)?: A Little Help needed standing up from a chair using your arms (e.g., wheelchair or bedside chair)?: A Little Help needed to walk in hospital room?: A Little Help needed climbing 3-5 steps with a railing? : A Little 6 Click Score: 18    End of Session Equipment Utilized During Treatment: Gait belt Activity Tolerance: Patient tolerated treatment well Patient left: in chair;with call bell/phone within reach;with chair alarm set Nurse Communication: Mobility status PT Visit Diagnosis: Difficulty in walking, not elsewhere classified (R26.2)     Time: 3235-5732 PT Time Calculation (min) (ACUTE ONLY): 30 min  Charges:  $Gait Training: 8-22 mins $Therapeutic Exercise: 8-22 mins                     [place] Pager [phone] Office [phone]    Siddhartha Hoback 06/09/2020, 2:14 PM

## 2020-06-09 NOTE — Discharge Summary (Signed)
PATIENT ID: Chase Keller        MRN:  [id number]          DOB/AGE: 1940/06/04 / 80 y.o.    DISCHARGE SUMMARY  ADMISSION DATE:    06/08/2020 DISCHARGE DATE:   06/09/2020   ADMISSION DIAGNOSIS: Primary localized osteoarthritis of right knee [M17.11]    DISCHARGE DIAGNOSIS:  djd right knee    ADDITIONAL DIAGNOSIS: Active Problems:   Primary localized osteoarthritis of right knee  Past Medical History:  Diagnosis Date  . Agent orange exposure   . Atrial fibrillation ([place]) 2013  . Cancer ([place])    bladder  . CHF (congestive heart failure) ([place])   . COPD (chronic obstructive pulmonary disease) ([place])   . Coronary artery disease   . Diabetes mellitus without complication ([facility])    diet controlled  . Dysrhythmia     A fib  . Heart murmur    / 1 MD said yes, 1 said no  . History of blood transfusion    during CABG  . History of kidney stones   . Hyperlipidemia   . Hypertension   . Left knee DJD   . MRSA (methicillin resistant staph aureus) culture positive    Drainage from Left Knee approx 2005  . Myocardial infarction ([place])    mild in 1998 per Wife   . PONV (postoperative nausea and vomiting)   . S/P coronary artery bypass graft x 2    1998  . Shortness of breath   . Sleep apnea    CPAP  . Stented coronary artery     PROCEDURE: Procedure(s): TOTAL KNEE ARTHROPLASTY Right on 06/08/2020  CONSULTS: PT/OT/Pharmacy    HISTORY:  See H&P in chart  HOSPITAL COURSE:  Chase Keller is a 80 y.o. admitted on 06/08/2020 and found to have a diagnosis of djd right knee.  After appropriate laboratory studies were obtained  they were taken to the operating room on 06/08/2020 and underwent  Procedure(s): TOTAL KNEE ARTHROPLASTY  Right.   They were given perioperative antibiotics:  Anti-infectives (From admission, onward)   Start     Dose/Rate Route Frequency Ordered Stop   06/08/20 1630  ceFAZolin (ANCEF) IVPB 1 g/50 mL premix        1 g 100 mL/hr over 30 Minutes Intravenous  Every 6 hours 06/08/20 1538 06/08/20 2201   06/08/20 0800  ceFAZolin (ANCEF) IVPB 2g/100 mL premix        2 g 200 mL/hr over 30 Minutes Intravenous On call to O.R. 06/08/20 9562 06/08/20 1110    .  Tolerated the procedure well.  Placed with a foley intraoperatively.      POD #1, allowed out of bed to a chair.  PT for ambulation and exercise program.  Foley D/C'd in morning.  IV saline locked.  O2 discontionued.   The remainder of the hospital course was dedicated to ambulation and strengthening.   The patient was discharged on 1 Day Post-Op in  Stable condition.  Blood products given:none  DIAGNOSTIC STUDIES: Recent vital signs:  Patient Vitals for the past 24 hrs:  BP Temp Temp src Pulse Resp SpO2  06/09/20 0529 (!) 107/58 98.1 F (36.7 C) Oral 71 16 95 %  06/09/20 0139 (!) 116/58 (!) 97.4 F (36.3 C) Oral (!) 50 16 97 %  06/08/20 2124 (!) 103/47 97.7 F (36.5 C) Oral (!) 54 16 93 %  06/08/20 1825 (!) 110/52 97.7 F (36.5 C) Oral (!) 45 17  97 %  06/08/20 1725 (!) 107/45 (!) 97.5 F (36.4 C) Axillary (!) 54 16 98 %  06/08/20 1620 101/61 (!) 97.5 F (36.4 C) - (!) 52 20 94 %  06/08/20 1523 (!) 112/55 (!) 97.5 F (36.4 C) - (!) 51 (!) 24 94 %  06/08/20 1500 102/70 97.6 F (36.4 C) - (!) 48 (!) 24 96 %  06/08/20 1445 - - - (!) 45 19 94 %  06/08/20 1430 96/78 - - 78 18 94 %  06/08/20 1415 (!) 104/55 - - (!) 49 17 96 %  06/08/20 1400 103/80 - - 90 18 93 %  06/08/20 1330 113/61 - - (!) 35 10 99 %  06/08/20 1315 112/70 - - (!) 53 17 100 %  06/08/20 1300 109/70 - - (!) 36 16 99 %  06/08/20 1249 106/60 97.6 F (36.4 C) - (!) 43 11 100 %       Recent laboratory studies: Recent Labs    06/04/20 1409 06/08/20 1449  WBC 10.6* 8.9  HGB 14.9 13.7  HCT 47.7 44.0  PLT 161 135*   Recent Labs    06/04/20 1409 06/08/20 1449  NA 140  --   K 4.3  --   CL 103  --   CO2 23  --   BUN 24*  --   CREATININE 0.96 1.05  GLUCOSE 121*  --   CALCIUM 10.0  --    Lab Results   Component Value Date   INR 1.4 (H) 06/09/2020   INR 1.5 (H) 06/08/2020   INR 1.5 (H) 06/08/2020     Recent Radiographic Studies :  No results found.  DISCHARGE INSTRUCTIONS:   DISCHARGE MEDICATIONS:   Allergies as of 06/09/2020      Reactions   Ace Inhibitors Cough   Armodafinil Other (See Comments)   headache   Chocolate Hives   Oxycodone Other (See Comments)   hallucinations   Rosuvastatin Other (See Comments)   Muscles aches   Statins Other (See Comments)   Muscle ache      Medication List    TAKE these medications   ACAI BERRY PO Take 3,000 mg by mouth daily.   albuterol 108 (90 Base) MCG/ACT inhaler Commonly known as: VENTOLIN HFA Inhale 2 puffs into the lungs every 6 (six) hours as needed for wheezing or shortness of breath.   ALFALFA PO Take 1,300 mg by mouth 2 (two) times daily.   allopurinol 300 MG tablet Commonly known as: ZYLOPRIM Take 300 mg by mouth daily.   amLODipine 2.5 MG tablet Commonly known as: NORVASC Take 2.5 mg by mouth daily as needed (If systolic BP over 678.).   ammonium lactate 12 % lotion Commonly known as: LAC-HYDRIN Apply 1 application topically daily as needed for dry skin.   APPLE CIDER VINEGAR PO Take 480 mg by mouth daily.   aspirin EC 81 MG tablet Take 81 mg by mouth daily.   Calcium/Vitamin D 600-400 MG-UNIT Tabs Take 1 tablet by mouth 2 (two) times daily.   carvedilol 6.25 MG tablet Commonly known as: COREG Take by mouth 2 (two) times daily with a meal.   Cinnamon 500 MG capsule Take 500 mg by mouth daily.   Coenzyme Q10 200 MG capsule Take 200 mg by mouth daily.   cyanocobalamin 1000 MCG tablet Take 1,000 mcg by mouth daily.   ezetimibe 10 MG tablet Commonly known as: ZETIA Take 10 mg by mouth daily.   ferrous sulfate 325 (65 FE)  MG tablet Take 325 mg by mouth daily with breakfast.   fluticasone 50 MCG/ACT nasal spray Commonly known as: FLONASE Place 2 sprays into both nostrils daily as needed for  rhinitis.   Glucosamine Chondr 500 Complex Caps Take 2 capsules by mouth 2 (two) times daily.   HYDROCERIN EX Apply 1 application topically daily as needed (dry skin).   HYDROcodone-acetaminophen 5-325 MG tablet Commonly known as: NORCO/VICODIN Take 1 tablet by mouth every 4 (four) hours as needed for moderate pain (may need 1-2 first few days, max 8 tabs in 24hrs).   niacin 500 MG CR capsule Take 500 mg by mouth at bedtime. B3   Potassium 99 MG Tabs Take 99 mg by mouth 2 (two) times daily.   pyridOXINE 50 MG tablet Commonly known as: B-6 Take 100 mg by mouth daily.   RED YEAST RICE EXTRACT PO Take 600 mg by mouth 2 (two) times daily.   riboflavin 100 MG Tabs tablet Commonly known as: VITAMIN B-2 Take 100 mg by mouth daily.   senna-docusate 8.6-50 MG tablet Commonly known as: Senokot-S Take 1 tablet by mouth 2 (two) times daily.   spironolactone 25 MG tablet Commonly known as: ALDACTONE Take 12.5 mg by mouth daily.   tamsulosin 0.4 MG Caps capsule Commonly known as: FLOMAX Take 0.4 mg by mouth at bedtime.   thiamine 100 MG tablet Take 100 mg by mouth daily.   traMADol 50 MG tablet Commonly known as: ULTRAM Take 50 mg by mouth every 6 (six) hours as needed for moderate pain.   trospium 20 MG tablet Commonly known as: SANCTURA Take 20 mg by mouth 2 (two) times daily.   vitamin C 1000 MG tablet Take 1,000 mg by mouth daily.   Vitamin D3 250 MCG (10000 UT) capsule Take 10,000 Units by mouth daily.   warfarin 1 MG tablet Commonly known as: COUMADIN Take 3 mg by mouth at bedtime.   zinc gluconate 50 MG tablet Take 50 mg by mouth daily.            Durable Medical Equipment  (From admission, onward)         Start     Ordered   06/08/20 1538  DME Walker rolling  Once       Question Answer Comment  Walker: With [place] Wheels   Patient needs a walker to treat with the following condition Primary localized osteoarthritis of right knee      06/08/20  1538          FOLLOW UP VISIT:    Follow-up Information    Earlie Server, MD. Schedule an appointment as soon as possible for a visit in 2 weeks.   Specialty: Orthopedic Surgery Contact information: [address] [place] [address] [phone]               DISPOSITION:   Home  CONDITION:  Stable   Chriss Czar, PA-C  06/09/2020 10:14 AM

## 2020-06-11 ENCOUNTER — Encounter (HOSPITAL_COMMUNITY): Payer: Self-pay | Admitting: Orthopedic Surgery

## 2020-06-11 NOTE — Anesthesia Postprocedure Evaluation (Signed)
Anesthesia Post Note  Patient: Leonel Ramsay  Procedure(s) Performed: TOTAL KNEE ARTHROPLASTY (Right Knee)     Patient location during evaluation: Nursing Unit Anesthesia Type: Spinal Level of consciousness: oriented and awake and alert Pain management: pain level controlled Vital Signs Assessment: post-procedure vital signs reviewed and stable Respiratory status: spontaneous breathing and respiratory function stable Cardiovascular status: blood pressure returned to baseline and stable Postop Assessment: no headache, no backache, no apparent nausea or vomiting and patient able to bend at knees Anesthetic complications: no   No complications documented.  Last Vitals:  Vitals:   06/09/20 0529 06/09/20 1032  BP: (!) 107/58 (!) 144/52  Pulse: 71 (!) 57  Resp: 16 16  Temp: 36.7 C 36.4 C  SpO2: 95% 95%    Last Pain:  Vitals:   06/09/20 1347  TempSrc:   PainSc: 6    Pain Goal: Patients Stated Pain Goal: 2 (06/09/20 1347)                 Barnet [place]

## 2020-06-19 ENCOUNTER — Telehealth: Payer: Self-pay | Admitting: General Practice

## 2020-06-19 NOTE — Telephone Encounter (Signed)
   Telephone encounter was:  Unsuccessful.  06/19/2020 Name: Chase Keller MRN: [id number] DOB: 1941/01/05  Unsuccessful outbound call made today to assist with:  Food Insecurity  Outreach Attempt:  1st Attempt  A HIPAA compliant voice message was left requesting a return call.  Instructed patient to call back at [phone]. Needs MOM's meals until he recovers from surgery  [place], Care Management Phone: [phone] Email: [email or web address]

## 2020-10-03 ENCOUNTER — Encounter (HOSPITAL_COMMUNITY): Payer: Self-pay

## 2020-10-03 ENCOUNTER — Emergency Department (HOSPITAL_COMMUNITY)
Admission: EM | Admit: 2020-10-03 | Discharge: 2020-10-03 | Disposition: A | Discharge status: Home / Self Care | Payer: No Typology Code available for payment source | Attending: Emergency Medicine | Admitting: Emergency Medicine

## 2020-10-03 ENCOUNTER — Other Ambulatory Visit: Payer: Self-pay

## 2020-10-03 DIAGNOSIS — Z96651 Presence of right artificial knee joint: Secondary | ICD-10-CM | POA: Diagnosis not present

## 2020-10-03 DIAGNOSIS — Z7982 Long term (current) use of aspirin: Secondary | ICD-10-CM | POA: Diagnosis not present

## 2020-10-03 DIAGNOSIS — Z8551 Personal history of malignant neoplasm of bladder: Secondary | ICD-10-CM | POA: Diagnosis not present

## 2020-10-03 DIAGNOSIS — Z79899 Other long term (current) drug therapy: Secondary | ICD-10-CM | POA: Insufficient documentation

## 2020-10-03 DIAGNOSIS — H9221 Otorrhagia, right ear: Secondary | ICD-10-CM | POA: Diagnosis not present

## 2020-10-03 DIAGNOSIS — I2510 Atherosclerotic heart disease of native coronary artery without angina pectoris: Secondary | ICD-10-CM | POA: Insufficient documentation

## 2020-10-03 DIAGNOSIS — R58 Hemorrhage, not elsewhere classified: Secondary | ICD-10-CM

## 2020-10-03 DIAGNOSIS — I509 Heart failure, unspecified: Secondary | ICD-10-CM | POA: Insufficient documentation

## 2020-10-03 DIAGNOSIS — Z7902 Long term (current) use of antithrombotics/antiplatelets: Secondary | ICD-10-CM | POA: Insufficient documentation

## 2020-10-03 DIAGNOSIS — Z951 Presence of aortocoronary bypass graft: Secondary | ICD-10-CM | POA: Insufficient documentation

## 2020-10-03 DIAGNOSIS — I4891 Unspecified atrial fibrillation: Secondary | ICD-10-CM | POA: Insufficient documentation

## 2020-10-03 DIAGNOSIS — I110 Hypertensive heart disease with heart failure: Secondary | ICD-10-CM | POA: Insufficient documentation

## 2020-10-03 DIAGNOSIS — E119 Type 2 diabetes mellitus without complications: Secondary | ICD-10-CM | POA: Diagnosis not present

## 2020-10-03 DIAGNOSIS — E039 Hypothyroidism, unspecified: Secondary | ICD-10-CM | POA: Diagnosis not present

## 2020-10-03 LAB — CBC
HCT: 43.7 % (ref 39.0–52.0)
Hemoglobin: 13.2 g/dL (ref 13.0–17.0)
MCH: 28.6 pg (ref 26.0–34.0)
MCHC: 30.2 g/dL (ref 30.0–36.0)
MCV: 94.6 fL (ref 80.0–100.0)
Platelets: 167 10*3/uL (ref 150–400)
RBC: 4.62 MIL/uL (ref 4.22–5.81)
RDW: 16.7 % — ABNORMAL HIGH (ref 11.5–15.5)
WBC: 8.9 10*3/uL (ref 4.0–10.5)
nRBC: 0 % (ref 0.0–0.2)

## 2020-10-03 LAB — PROTIME-INR
INR: 2.1 — ABNORMAL HIGH (ref 0.8–1.2)
Prothrombin Time: 24 seconds — ABNORMAL HIGH (ref 11.4–15.2)

## 2020-10-03 MED ORDER — TRANEXAMIC ACID FOR EPISTAXIS
500.0000 mg | Freq: Once | TOPICAL | Status: AC
  Administered 2020-10-03: 14:00:00 500 mg via TOPICAL
  Filled 2020-10-03: qty 10

## 2020-10-03 NOTE — ED Provider Notes (Signed)
[facility] EMERGENCY DEPARTMENT Provider Note   CSN: [id number] Arrival date & time: 10/03/20  3149     History Chief Complaint  Patient presents with   Ear Laceration    Chase Keller is a 80 y.o. male with a history of atrial fibrillation on Coumadin, COPD, CAD, diabetes mellitus, hypertension, and hyperlipidemia who presents to the emergency department with complaints of bleeding from his right ear at procedure site this morning.  Patient states that he had a biopsy taken from his right ear at the [place] in [facility] yesterday, he had a little bleeding after the procedure but it subsequently resolved.  Early this morning the bleeding returned.  It has been occurring intermittently since onset.  No alleviating or aggravating factors.  He denies lightheadedness, dizziness, syncope, chest pain, or trouble breathing.  He is anticoagulated on Coumadin.  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   CHF (congestive heart failure) (HCC)    COPD (chronic obstructive pulmonary disease) ([place])    Coronary artery disease    Diabetes mellitus without complication (HCC)    diet controlled   Dysrhythmia     A fib   Heart murmur    / 1 MD said yes, 1 said no   History of blood transfusion    during CABG   History of kidney stones    Hyperlipidemia    Hypertension    Left knee DJD    MRSA (methicillin resistant staph aureus) culture positive    Drainage from Left Knee approx 2005   Myocardial infarction ([place])    mild in 1998 per Wife    PONV (postoperative nausea and vomiting)    S/P coronary artery bypass graft x 2    1998   Shortness of breath    Sleep apnea    CPAP   Stented coronary artery     Patient Active Problem List   Diagnosis Date Noted   Primary localized osteoarthritis of right knee 06/08/2020   Pain due to onychomycosis of toenails of both feet 11/08/2018    Pincer nail deformity 11/08/2018   Coagulation disorder ([place]) 11/08/2018   Diabetes mellitus type 2, diet-controlled ([place]) 06/29/2012   Atrial fibrillation (HCC)    Elevated fasting glucose 02/25/2012   Long term (current) use of anticoagulants 02/25/2012   CHF (congestive heart failure) ([place]) 02/24/2012   Cough 02/24/2012   Shortness of breath 02/24/2012   Incomplete right bundle branch block 10/14/2011   Leukocytosis 10/14/2011   Bradycardia 10/13/2011   Left knee DJD    Coronary artery disease    Stented coronary artery    S/P coronary artery bypass graft x 2    Obesity (BMI 30-39.9) 09/01/2011   Hypertension 04/06/2011   Obstructive sleep apnea 04/04/2011   Hypothyroidism 03/29/2011   Hyperlipidemia 03/29/2011    Past Surgical History:  Procedure Laterality Date   Bone spurs     Back   Cardiac stents  2005   CORONARY ARTERY BYPASS GRAFT  1988   2 vessel,  followed by [facility]   NASAL SINUS SURGERY     Due to sinus "impaction"   PTCA  2005   [name]   skin moles biopsied     TOTAL KNEE ARTHROPLASTY  10/13/2011   Procedure: TOTAL KNEE ARTHROPLASTY;  Surgeon: Lorn Junes, MD;  Location: [facility];  Service:  Orthopedics;  Laterality: Left;  left total knee arthroplasty   TOTAL KNEE ARTHROPLASTY Right 06/08/2020   Procedure: TOTAL KNEE ARTHROPLASTY;  Surgeon: Earlie Server, MD;  Location: [facility];  Service: Orthopedics;  Laterality: Right;       Family History  Problem Relation Age of Onset   Heart disease Mother    Heart attack Father    Heart attack Brother        died of an MI after total knee   Heart disease Brother        multiple bypass surgery    Social History   Tobacco Use   Smoking status: Never   Smokeless tobacco: Never   Tobacco comments:    father smoked in the home while pt was growing up//ex-wife smoked  Vaping Use   Vaping Use: Never used  Substance Use Topics   Alcohol use: No   Drug use: No    Home Medications Prior to Admission  medications   Medication Sig Start Date End Date Taking? Authorizing Provider  ACAI BERRY PO Take 3,000 mg by mouth daily.    [provider]  albuterol (VENTOLIN HFA) 108 (90 Base) MCG/ACT inhaler Inhale 2 puffs into the lungs every 6 (six) hours as needed for wheezing or shortness of breath.    [provider]  ALFALFA PO Take 1,300 mg by mouth 2 (two) times daily.    [provider]  allopurinol (ZYLOPRIM) 300 MG tablet Take 300 mg by mouth daily.    [provider]  amLODipine (NORVASC) 2.5 MG tablet Take 2.5 mg by mouth daily as needed (If systolic BP over 700.). 17/49/44   Crecencio Mc, MD  ammonium lactate (LAC-HYDRIN) 12 % lotion Apply 1 application topically daily as needed for dry skin.    [provider]  APPLE CIDER VINEGAR PO Take 480 mg by mouth daily.    [provider]  Ascorbic Acid (VITAMIN C) 1000 MG tablet Take 1,000 mg by mouth daily.    [provider]  aspirin EC 81 MG tablet Take 81 mg by mouth daily.    [provider]  Calcium Carb-Cholecalciferol (CALCIUM/VITAMIN D) 600-400 MG-UNIT TABS Take 1 tablet by mouth 2 (two) times daily.    [provider]  carvedilol (COREG) 6.25 MG tablet Take by mouth 2 (two) times daily with a meal.    [provider]  Cholecalciferol (VITAMIN D3) 250 MCG (10000 UT) capsule Take 10,000 Units by mouth daily.    [provider]  Cinnamon 500 MG capsule Take 500 mg by mouth daily.    [provider]  Coenzyme Q10 200 MG capsule Take 200 mg by mouth daily.    [provider]  cyanocobalamin 1000 MCG tablet Take 1,000 mcg by mouth daily.    [provider]  ezetimibe (ZETIA) 10 MG tablet Take 10 mg by mouth daily.    [provider]  ferrous sulfate 325 (65 FE) MG tablet Take 325 mg by mouth daily with breakfast.    [provider]  fluticasone (FLONASE) 50 MCG/ACT nasal spray Place 2 sprays into both  nostrils daily as needed for rhinitis. 02/24/12   [provider]  Glucosamine-Chondroit-Vit C-Mn (GLUCOSAMINE CHONDR 500 COMPLEX) CAPS Take 2 capsules by mouth 2 (two) times daily.    [provider]  HYDROcodone-acetaminophen (NORCO/VICODIN) 5-325 MG tablet Take 1 tablet by mouth every 4 (four) hours as needed for moderate pain (may need 1-2 first few days, max  8 tabs in 24hrs). 06/08/20 06/08/21  Chadwell, Vonna Kotyk, PA-C  niacin 500 MG CR capsule Take 500 mg by mouth at bedtime. B3    [provider]  Potassium 99 MG TABS Take 99 mg by mouth 2 (two) times daily.    [provider]  pyridOXINE (B-6) 50 MG tablet Take 100 mg by mouth daily.    [provider]  RED YEAST RICE EXTRACT PO Take 600 mg by mouth 2 (two) times daily.    [provider]  riboflavin (VITAMIN B-2) 100 MG TABS tablet Take 100 mg by mouth daily.    [provider]  senna-docusate (SENOKOT-S) 8.6-50 MG tablet Take 1 tablet by mouth 2 (two) times daily.    [provider]  Skin Protectants, Misc. (HYDROCERIN EX) Apply 1 application topically daily as needed (dry skin).    [provider]  spironolactone (ALDACTONE) 25 MG tablet Take 12.5 mg by mouth daily.    [provider]  tamsulosin (FLOMAX) 0.4 MG CAPS capsule Take 0.4 mg by mouth at bedtime.    [provider]  thiamine 100 MG tablet Take 100 mg by mouth daily.    [provider]  traMADol (ULTRAM) 50 MG tablet Take 50 mg by mouth every 6 (six) hours as needed for moderate pain.    [provider]  trospium (SANCTURA) 20 MG tablet Take 20 mg by mouth 2 (two) times daily.    [provider]  warfarin (COUMADIN) 1 MG tablet Take 3 mg by mouth at bedtime. 04/11/12   [provider]  zinc gluconate 50 MG tablet Take 50 mg by mouth daily.    [provider]    Allergies    Ace inhibitors, Armodafinil, Chocolate, Oxycodone, Rosuvastatin, and  Statins  Review of Systems   Review of Systems  Constitutional:  Negative for chills and fever.  Respiratory:  Negative for shortness of breath.   Cardiovascular:  Negative for chest pain.  Gastrointestinal:  Negative for vomiting.  Skin:  Positive for wound.  Neurological:  Negative for dizziness, syncope and light-headedness.  All other systems reviewed and are negative.  Physical Exam Updated Vital Signs BP 128/66   Pulse (!) 54   Temp 97.9 F (36.6 C) (Oral)   Resp (!) 24   Ht 5\' 7"  (1.702 m)   Wt 93 kg   SpO2 97%   BMI 32.11 kg/m   Physical Exam Vitals and nursing note reviewed.  Constitutional:      General: He is not in acute distress.    Appearance: He is well-developed.  HENT:     Head: Normocephalic and atraumatic.     Ears:   Eyes:     General:        Right eye: No discharge.        Left eye: No discharge.     Conjunctiva/sclera: Conjunctivae normal.  Cardiovascular:     Rate and Rhythm: Bradycardia present. Rhythm irregular.  Pulmonary:     Effort: Pulmonary effort is normal.  Abdominal:     General: There is no distension.     Palpations: Abdomen is soft.  Skin:    General: Skin is warm.  Neurological:     Mental Status: He is alert.     Comments: Clear speech.   Psychiatric:        Behavior: Behavior normal.        Thought Content: Thought content normal.    ED Results / Procedures /  Treatments   Labs (all labs ordered are listed, but only abnormal results are displayed) Labs Reviewed  CBC - Abnormal; Notable for the following components:      Result Value   RDW 16.7 (*)    All other components within normal limits  PROTIME-INR - Abnormal; Notable for the following components:   Prothrombin Time 24.0 (*)    INR 2.1 (*)    All other components within normal limits    EKG None  Radiology No results found.  Procedures Procedures   Medications Ordered in ED Medications  tranexamic acid (CYKLOKAPRON) 1000 MG/10ML topical  solution 500 mg (500 mg Topical Given 10/03/20 1403)    ED Course  I have reviewed the triage vital signs and the nursing notes.  Pertinent labs & imaging results that were available during my care of the patient were reviewed by me and considered in my medical decision making (see chart for details).    MDM Rules/Calculators/A&P                         Patient presents to the ED with bleeding from his right ear biopsy site from yesterday. Anticoagulated on coumadin.  Patient is nontoxic, mildly bradycardic, vitals otherwise unremarkable.  He has some mild oozing on evaluation.  Additional history obtained:  Additional history obtained from chart review & nursing note review.   Lab Tests:  I reviewed and interpreted labs, which included:  CBC: No critical anemia INR: 2.1, not critically elevated.  ED Course:  Attempted application of pressure and wound powder sealant without success.  Pressure was reapplied, upon removal of Coban there is no active bleeding, subsequently applied topical TXA soaked gauze for 30 minutes with coban- no return of bleeding.  Initial plan was to remove cottonball soaked in TXA and Coban to replace with Surgicel, however we do not have Surgicel at this time, I discussed w/ attending, no further bleeding on re-evaluation, will leave current dressing in place.  We will have patient keep this in place for the next 24 hours then can slowly remove/soak at home.  Recommend he alert his provider that performed the procedure and his primary care provider of his visit. I discussed results, treatment plan, need for follow-up, and return precautions with the patient. Provided opportunity for questions, patient confirmed understanding and is in agreement with plan.    Findings and plan of care discussed with supervising physician Dr. Johnney Killian who has evaluated the patient, provided guidance & is in agreement.   Portions of this note were generated with Theatre manager. Dictation errors may occur despite best attempts at proofreading.  Final Clinical Impression(s) / ED Diagnoses Final diagnoses:  Bleeding    Rx / DC Orders ED Discharge Orders     None        Amaryllis Dyke, PA-C 10/03/20 1550    Charlesetta Shanks, MD 10/10/20 1035

## 2020-10-03 NOTE — ED Provider Notes (Signed)
Emergency Medicine Provider Triage Evaluation Note  Chase Keller , a 80 y.o. male  was evaluated in triage.  Pt complains of bleeding right ear.  Patient is on warfarin.  He went to the [place] in [facility] yesterday for biopsy.  It was not bleeding at time of discharge, but sometime last night he states that he noticed that began to bleed profusely.  No injuries.  Review of Systems  Positive: Bleeding ear Negative: New trauma  Physical Exam  BP 133/65 (BP Location: Left Arm)   Pulse 66   Temp 97.9 F (36.6 C) (Oral)   Resp 17   SpO2 94%  Gen:   Awake, no distress   Resp:  Normal effort  MSK:   Moves extremities without difficulty  Other:  Right ear: There is active bleeding from pinna.  Medical Decision Making  Medically screening exam initiated at 9:37 AM.  Appropriate orders placed.  Chase Keller was informed that the remainder of the evaluation will be completed by another provider, this initial triage assessment does not replace that evaluation, and the importance of remaining in the ED until their evaluation is complete.     Corena Herter, PA-C 10/03/20 [place], MD 10/03/20 1011

## 2020-10-03 NOTE — Discharge Instructions (Addendum)
You were seen in the emergency department for bleeding from your biopsy site.  Your INR was 2.1.  Your hemoglobin and hematocrit looked okay.  We have applied a dressing to stop the bleeding, please leave this in place for 24 hours, you may then gently take it off.  You can try to remove it by soaking it or apply some antibiotic ointment.  Please call your primary care provider and/or your provider who did the procedure today to make them aware of your ER visit and to establish close follow-up within the next few days.  Return to the ER for new or worsening symptoms including but not limited to uncontrolled bleeding, fever, lightheadedness, dizziness, passing out, chest pain, trouble breathing, or any other concerns.

## 2020-10-03 NOTE — ED Triage Notes (Signed)
T presents with uncontrolled bleeding to his Right ear. Had a biopsy done at [facility] yesterday. Intermittent bleeding yesterday. Pt is on a blood thinner. Pt uses a CPAP at night. Thinks he may have irritated it with CPAP

## 2022-02-19 ENCOUNTER — Other Ambulatory Visit: Payer: Self-pay

## 2022-02-19 ENCOUNTER — Encounter: Payer: No Typology Code available for payment source | Attending: Family Medicine

## 2022-02-19 DIAGNOSIS — Z9889 Other specified postprocedural states: Secondary | ICD-10-CM | POA: Insufficient documentation

## 2022-02-19 DIAGNOSIS — I213 ST elevation (STEMI) myocardial infarction of unspecified site: Secondary | ICD-10-CM

## 2022-02-19 DIAGNOSIS — Z48812 Encounter for surgical aftercare following surgery on the circulatory system: Secondary | ICD-10-CM | POA: Insufficient documentation

## 2022-02-19 DIAGNOSIS — Z955 Presence of coronary angioplasty implant and graft: Secondary | ICD-10-CM | POA: Insufficient documentation

## 2022-02-19 NOTE — Progress Notes (Signed)
Virtual Visit completed. Patient informed on EP and RD appointment and 6 Minute walk test. Patient also informed of patient health questionnaires on My Chart. Patient Verbalizes understanding. Visit diagnosis can be found in [facility] 02/02/2022.

## 2022-03-03 ENCOUNTER — Encounter: Payer: No Typology Code available for payment source | Admitting: *Deleted

## 2022-03-03 VITALS — Ht 65.9 in | Wt 213.3 lb

## 2022-03-03 DIAGNOSIS — Z955 Presence of coronary angioplasty implant and graft: Secondary | ICD-10-CM | POA: Diagnosis not present

## 2022-03-03 DIAGNOSIS — Z9889 Other specified postprocedural states: Secondary | ICD-10-CM | POA: Diagnosis not present

## 2022-03-03 DIAGNOSIS — Z48812 Encounter for surgical aftercare following surgery on the circulatory system: Secondary | ICD-10-CM | POA: Diagnosis present

## 2022-03-03 NOTE — Patient Instructions (Signed)
Patient Instructions  Patient Details  Name: Chase Keller MRN: [id number] Date of Birth: 12/21/1940 Referring Provider:  Lendon Collar, MD  Below are your personal goals for exercise, nutrition, and risk factors. Our goal is to help you stay on track towards obtaining and maintaining these goals. We will be discussing your progress on these goals with you throughout the program.  Initial Exercise Prescription:  Initial Exercise Prescription - 03/03/22 1500       Date of Initial Exercise RX and Referring Provider   Date 03/03/22    Referring Provider Ruther[place], Vicente Males MD ([place])      Oxygen   Maintain Oxygen Saturation 88% or higher      Recumbant Bike   Level 1    RPM 50    Watts 5    Minutes 15    METs 2      NuStep   Level 1    SPM 80    Minutes 15    METs 2      T5 Nustep   Level 1    SPM 80    Minutes 15    METs 2      Biostep-RELP   Level 1    SPM 50    Minutes 15    METs 2      Track   Laps 27    Minutes 15    METs 2.47      Prescription Details   Frequency (times per week) 3    Duration Progress to 30 minutes of continuous aerobic without signs/symptoms of physical distress      Intensity   THRR 40-80% of Max Heartrate 92-123    Ratings of Perceived Exertion 11-13    Perceived Dyspnea 0-4      Progression   Progression Continue to progress workloads to maintain intensity without signs/symptoms of physical distress.      Resistance Training   Training Prescription Yes    Weight 5 lb    Reps 10-15             Exercise Goals: Frequency: Be able to perform aerobic exercise two to three times per week in program working toward 2-5 days per week of home exercise.  Intensity: Work with a perceived exertion of 11 (fairly light) - 15 (hard) while following your exercise prescription.  We will make changes to your prescription with you as you progress through the program.   Duration: Be able to do 30 to 45 minutes of continuous aerobic  exercise in addition to a 5 minute warm-up and a 5 minute cool-down routine.   Nutrition Goals: Your personal nutrition goals will be established when you do your nutrition analysis with the dietician.  The following are general nutrition guidelines to follow: Cholesterol < '200mg'$ /day Sodium < '1500mg'$ /day Fiber: Men over 50 yrs - 30 grams per day  Personal Goals:  Personal Goals and Risk Factors at Admission - 03/03/22 1515       Core Components/Risk Factors/Patient Goals on Admission    Weight Management Yes;Weight Loss;Obesity    Intervention Weight Management: Develop a combined nutrition and exercise program designed to reach desired caloric intake, while maintaining appropriate intake of nutrient and fiber, sodium and fats, and appropriate energy expenditure required for the weight goal.;Weight Management: Provide education and appropriate resources to help participant work on and attain dietary goals.;Weight Management/Obesity: Establish reasonable short term and long term weight goals.;Obesity: Provide education and appropriate resources to help participant work on and attain  dietary goals.    Admit Weight 213 lb 4.8 oz (96.8 kg)    Goal Weight: Short Term 210 lb (95.3 kg)    Goal Weight: Long Term 205 lb (93 kg)    Expected Outcomes Short Term: Continue to assess and modify interventions until short term weight is achieved;Long Term: Adherence to nutrition and physical activity/exercise program aimed toward attainment of established weight goal;Weight Loss: Understanding of general recommendations for a balanced deficit meal plan, which promotes 1-2 lb weight loss per week and includes a negative energy balance of [phone] kcal/d;Understanding recommendations for meals to include 15-35% energy as protein, 25-35% energy from fat, 35-60% energy from carbohydrates, less than '200mg'$  of dietary cholesterol, 20-35 gm of total fiber daily;Understanding of distribution of calorie intake throughout  the day with the consumption of 4-5 meals/snacks    Improve shortness of breath with ADL's Yes    Intervention Provide education, individualized exercise plan and daily activity instruction to help decrease symptoms of SOB with activities of daily living.    Expected Outcomes Short Term: Improve cardiorespiratory fitness to achieve a reduction of symptoms when performing ADLs;Long Term: Be able to perform more ADLs without symptoms or delay the onset of symptoms    Diabetes Yes    Intervention Provide education about signs/symptoms and action to take for hypo/hyperglycemia.;Provide education about proper nutrition, including hydration, and aerobic/resistive exercise prescription along with prescribed medications to achieve blood glucose in normal ranges: Fasting glucose 65-99 mg/dL    Expected Outcomes Short Term: Participant verbalizes understanding of the signs/symptoms and immediate care of hyper/hypoglycemia, proper foot care and importance of medication, aerobic/resistive exercise and nutrition plan for blood glucose control.;Long Term: Attainment of HbA1C < 7%.    Hypertension Yes    Intervention Provide education on lifestyle modifcations including regular physical activity/exercise, weight management, moderate sodium restriction and increased consumption of fresh fruit, vegetables, and low fat dairy, alcohol moderation, and smoking cessation.;Monitor prescription use compliance.    Expected Outcomes Short Term: Continued assessment and intervention until BP is < 140/50m HG in hypertensive participants. < 130/810mHG in hypertensive participants with diabetes, heart failure or chronic kidney disease.;Long Term: Maintenance of blood pressure at goal levels.    Lipids Yes    Intervention Provide education and support for participant on nutrition & aerobic/resistive exercise along with prescribed medications to achieve LDL '70mg'$ , HDL >'40mg'$ .    Expected Outcomes Short Term: Participant states  understanding of desired cholesterol values and is compliant with medications prescribed. Participant is following exercise prescription and nutrition guidelines.;Long Term: Cholesterol controlled with medications as prescribed, with individualized exercise RX and with personalized nutrition plan. Value goals: LDL < '70mg'$ , HDL > 40 mg.             Tobacco Use Initial Evaluation: Social History   Tobacco Use  Smoking Status Never  Smokeless Tobacco Never  Tobacco Comments   father smoked in the home while pt was growing up//ex-wife smoked    Exercise Goals and Review:  Exercise Goals     Row Name 03/03/22 1512             Exercise Goals   Increase Physical Activity Yes       Intervention Provide advice, education, support and counseling about physical activity/exercise needs.;Develop an individualized exercise prescription for aerobic and resistive training based on initial evaluation findings, risk stratification, comorbidities and participant's personal goals.       Expected Outcomes Short Term: Attend rehab on a regular basis to  increase amount of physical activity.;Long Term: Add in home exercise to make exercise part of routine and to increase amount of physical activity.;Long Term: Exercising regularly at least 3-5 days a week.       Increase Strength and Stamina Yes       Intervention Provide advice, education, support and counseling about physical activity/exercise needs.;Develop an individualized exercise prescription for aerobic and resistive training based on initial evaluation findings, risk stratification, comorbidities and participant's personal goals.       Expected Outcomes Short Term: Increase workloads from initial exercise prescription for resistance, speed, and METs.;Short Term: Perform resistance training exercises routinely during rehab and add in resistance training at home;Long Term: Improve cardiorespiratory fitness, muscular endurance and strength as measured  by increased METs and functional capacity (6MWT)       Able to understand and use rate of perceived exertion (RPE) scale Yes       Intervention Provide education and explanation on how to use RPE scale       Expected Outcomes Short Term: Able to use RPE daily in rehab to express subjective intensity level;Long Term:  Able to use RPE to guide intensity level when exercising independently       Able to understand and use Dyspnea scale Yes       Intervention Provide education and explanation on how to use Dyspnea scale       Expected Outcomes Short Term: Able to use Dyspnea scale daily in rehab to express subjective sense of shortness of breath during exertion;Long Term: Able to use Dyspnea scale to guide intensity level when exercising independently       Knowledge and understanding of Target Heart Rate Range (THRR) Yes       Intervention Provide education and explanation of THRR including how the numbers were predicted and where they are located for reference       Expected Outcomes Short Term: Able to state/look up THRR;Short Term: Able to use daily as guideline for intensity in rehab;Long Term: Able to use THRR to govern intensity when exercising independently       Able to check pulse independently Yes       Intervention Provide education and demonstration on how to check pulse in carotid and radial arteries.;Review the importance of being able to check your own pulse for safety during independent exercise       Expected Outcomes Short Term: Able to explain why pulse checking is important during independent exercise;Long Term: Able to check pulse independently and accurately       Understanding of Exercise Prescription Yes       Intervention Provide education, explanation, and written materials on patient's individual exercise prescription       Expected Outcomes Short Term: Able to explain program exercise prescription;Long Term: Able to explain home exercise prescription to exercise independently                 Copy of goals given to participant.

## 2022-03-03 NOTE — Progress Notes (Signed)
Cardiac Individual Treatment Plan  Patient Details  Name: Chase Keller MRN: [id number] Date of Birth: [birth year] Referring Provider:   Flowsheet Row Cardiac Rehab from 03/03/2022 in [facility] Cardiac and Pulmonary Rehab  Referring Provider Lendon Collar MD ([place])       Initial Encounter Date:  Flowsheet Row Cardiac Rehab from 03/03/2022 in [facility] Cardiac and Pulmonary Rehab  Date 03/03/22       Visit Diagnosis: Status post coronary artery stent placement  Patient's Home Medications on Admission:  Current Outpatient Medications:    ACAI BERRY PO, Take 3,000 mg by mouth daily., Disp: , Rfl:    albuterol (VENTOLIN HFA) 108 (90 Base) MCG/ACT inhaler, Inhale 2 puffs into the lungs every 6 (six) hours as needed for wheezing or shortness of breath., Disp: , Rfl:    ALFALFA PO, Take 1,300 mg by mouth 2 (two) times daily., Disp: , Rfl:    allopurinol (ZYLOPRIM) 300 MG tablet, Take 300 mg by mouth daily., Disp: , Rfl:    amLODipine (NORVASC) 2.5 MG tablet, Take 2.5 mg by mouth daily as needed (If systolic BP over 226.)., Disp: , Rfl:    ammonium lactate (LAC-HYDRIN) 12 % lotion, Apply 1 application topically daily as needed for dry skin., Disp: , Rfl:    ammonium lactate (LAC-HYDRIN) 12 % lotion, APPLY SMALL AMOUNT TOPICALLY IN THE MORNING FOR DRY SKIN (Patient not taking: Reported on 02/19/2022), Disp: , Rfl:    apixaban (ELIQUIS) 5 MG TABS tablet, TAKE ONE TABLET BY MOUTH EVERY 12 HOURS CAUTION BLOOD THINNER THIS REPLACES YOUR WARFARIN, Disp: , Rfl:    APPLE CIDER VINEGAR PO, Take 480 mg by mouth daily., Disp: , Rfl:    Ascorbic Acid (VITAMIN C) 1000 MG tablet, Take 1,000 mg by mouth daily., Disp: , Rfl:    aspirin EC 81 MG tablet, Take 81 mg by mouth daily., Disp: , Rfl:    benzonatate (TESSALON) 100 MG capsule, Take 1 capsule by mouth 3 (three) times daily as needed., Disp: , Rfl:    Calcium Carb-Cholecalciferol (CALCIUM/VITAMIN D) 600-400 MG-UNIT TABS, Take 1 tablet by mouth 2 (two)  times daily. (Patient not taking: Reported on 02/19/2022), Disp: , Rfl:    Calcium Carbonate-Vit D-Min (CALCIUM 600+D PLUS MINERALS) 600-400 MG-UNIT TABS, Take 1 tablet by mouth 2 (two) times daily., Disp: , Rfl:    carvedilol (COREG) 6.25 MG tablet, Take by mouth 2 (two) times daily with a meal., Disp: , Rfl:    Cholecalciferol (VITAMIN D3) 250 MCG (10000 UT) capsule, Take 10,000 Units by mouth daily., Disp: , Rfl:    Cholecalciferol 125 MCG (5000 UT) TABS, Take by mouth., Disp: , Rfl:    Cinnamon 500 MG capsule, Take 500 mg by mouth daily., Disp: , Rfl:    Coenzyme Q10 200 MG capsule, Take 200 mg by mouth daily., Disp: , Rfl:    cyanocobalamin 100 MCG tablet, TAKE ONE TABLET BY MOUTH EVERY DAY TO PREVENT VITAMIN B12 DEFICIENCY (Patient not taking: Reported on 02/19/2022), Disp: , Rfl:    cyanocobalamin 1000 MCG tablet, Take 1,000 mcg by mouth daily., Disp: , Rfl:    ezetimibe (ZETIA) 10 MG tablet, Take 10 mg by mouth daily., Disp: , Rfl:    ferrous sulfate 325 (65 FE) MG tablet, Take 325 mg by mouth daily with breakfast., Disp: , Rfl:    fluticasone (FLONASE) 50 MCG/ACT nasal spray, Place 2 sprays into both nostrils daily as needed for rhinitis. (Patient not taking: Reported on 02/19/2022), Disp: , Rfl:  fluticasone (FLONASE) 50 MCG/ACT nasal spray, Place into the nose., Disp: , Rfl:    Glucosamine-Chondroit-Vit C-Mn (GLUCOSAMINE CHONDR 500 COMPLEX) CAPS, Take 2 capsules by mouth 2 (two) times daily., Disp: , Rfl:    lidocaine (LIDODERM) 5 %, Place onto the skin., Disp: , Rfl:    MAGNESIUM PO, Take by mouth., Disp: , Rfl:    niacin 500 MG CR capsule, Take 500 mg by mouth at bedtime. B3, Disp: , Rfl:    Potassium 99 MG TABS, Take 99 mg by mouth 2 (two) times daily., Disp: , Rfl:    potassium gluconate 595 (99 K) MG TABS tablet, Take by mouth., Disp: , Rfl:    pyridOXINE (B-6) 50 MG tablet, Take 100 mg by mouth daily., Disp: , Rfl:    pyridOXINE (B-6) 50 MG tablet, Take 2 tablets by mouth  daily. (Patient not taking: Reported on 02/19/2022), Disp: , Rfl:    RED YEAST RICE EXTRACT PO, Take 600 mg by mouth 2 (two) times daily., Disp: , Rfl:    riboflavin (VITAMIN B-2) 100 MG TABS tablet, Take 100 mg by mouth daily., Disp: , Rfl:    senna-docusate (SENOKOT-S) 8.6-50 MG tablet, Take 1 tablet by mouth 2 (two) times daily., Disp: , Rfl:    Skin Protectants, Misc. (HYDROCERIN EX), Apply 1 application topically daily as needed (dry skin)., Disp: , Rfl:    Skin Protectants, Misc. (MINERIN CREME EX), Apply topically., Disp: , Rfl:    spironolactone (ALDACTONE) 25 MG tablet, Take 12.5 mg by mouth daily. (Patient not taking: Reported on 02/19/2022), Disp: , Rfl:    spironolactone (ALDACTONE) 25 MG tablet, Take 0.5 tablets by mouth daily., Disp: , Rfl:    tamsulosin (FLOMAX) 0.4 MG CAPS capsule, Take 0.4 mg by mouth at bedtime. (Patient not taking: Reported on 02/19/2022), Disp: , Rfl:    tamsulosin (FLOMAX) 0.4 MG CAPS capsule, Take 1 capsule by mouth at bedtime., Disp: , Rfl:    thiamine 100 MG tablet, Take 100 mg by mouth daily., Disp: , Rfl:    traMADol (ULTRAM) 50 MG tablet, Take 50 mg by mouth every 6 (six) hours as needed for moderate pain., Disp: , Rfl:    trospium (SANCTURA) 20 MG tablet, Take 20 mg by mouth 2 (two) times daily., Disp: , Rfl:    warfarin (COUMADIN) 1 MG tablet, Take 3 mg by mouth at bedtime., Disp: , Rfl:    zinc gluconate 50 MG tablet, Take 50 mg by mouth daily., Disp: , Rfl:   Past Medical History: Past Medical History:  Diagnosis Date   Agent orange exposure    Atrial fibrillation ([place]) 2013   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   CHF (congestive heart failure) (HCC)    COPD (chronic obstructive pulmonary disease) (HCC)    Coronary artery disease    Diabetes mellitus without complication (HCC)    diet controlled   Dysrhythmia     A fib   Heart murmur    / 1 MD said yes, 1 said no   History of blood transfusion    during CABG   History of kidney stones     Hyperlipidemia    Hypertension    Left knee DJD    MRSA (methicillin resistant staph aureus) culture positive    Drainage from Left Knee approx 2005   Myocardial infarction ([place])    mild in 1998 per Wife    PONV (postoperative nausea and vomiting)    S/P coronary artery bypass graft x  2    1998   Shortness of breath    Sleep apnea    CPAP   Stented coronary artery     Tobacco Use: Social History   Tobacco Use  Smoking Status Never  Smokeless Tobacco Never  Tobacco Comments   father smoked in the home while pt was growing up//ex-wife smoked    Labs: Review Flowsheet       Latest Ref Rng & Units 03/28/2011 01/14/2012 02/25/2012 06/04/2020  Labs for ITP Cardiac and Pulmonary Rehab  Cholestrol 0 - 200 mg/dL 179  158  - -  LDL (calc) 0 - 99 mg/dL 124  110  - -  HDL-C >39.00 mg/dL 26.00  24.00  - -  Trlycerides 0.0 - 149.0 mg/dL 146.0  122.0  - -  Hemoglobin A1c 4.8 - 5.6 % - - 6.3  6.1      Exercise Target Goals: Exercise Program Goal: Individual exercise prescription set using results from initial 6 min walk test and THRR while considering  patient's activity barriers and safety.   Exercise Prescription Goal: Initial exercise prescription builds to 30-45 minutes a day of aerobic activity, 2-3 days per week.  Home exercise guidelines will be given to patient during program as part of exercise prescription that the participant will acknowledge.   Education: Aerobic Exercise: - Group verbal and visual presentation on the components of exercise prescription. Introduces F.I.T.T principle from ACSM for exercise prescriptions.  Reviews F.I.T.T. principles of aerobic exercise including progression. Written material given at graduation. Flowsheet Row Cardiac Rehab from 03/03/2022 in [facility] Cardiac and Pulmonary Rehab  Education need identified 03/03/22       Education: Resistance Exercise: - Group verbal and visual presentation on the components of exercise prescription.  Introduces F.I.T.T principle from ACSM for exercise prescriptions  Reviews F.I.T.T. principles of resistance exercise including progression. Written material given at graduation.    Education: Exercise & Equipment Safety: - Individual verbal instruction and demonstration of equipment use and safety with use of the equipment. Flowsheet Row Cardiac Rehab from 03/03/2022 in [facility] Cardiac and Pulmonary Rehab  Date 02/19/22  Educator [facility]  Instruction Review Code 1- Verbalizes Understanding       Education: Exercise Physiology & General Exercise Guidelines: - Group verbal and written instruction with models to review the exercise physiology of the cardiovascular system and associated critical values. Provides general exercise guidelines with specific guidelines to those with heart or lung disease.    Education: Flexibility, Balance, Mind/Body Relaxation: - Group verbal and visual presentation with interactive activity on the components of exercise prescription. Introduces F.I.T.T principle from ACSM for exercise prescriptions. Reviews F.I.T.T. principles of flexibility and balance exercise training including progression. Also discusses the mind body connection.  Reviews various relaxation techniques to help reduce and manage stress (i.e. Deep breathing, progressive muscle relaxation, and visualization). Balance handout provided to take home. Written material given at graduation.   Activity Barriers & Risk Stratification:  Activity Barriers & Cardiac Risk Stratification - 03/03/22 1507       Activity Barriers & Cardiac Risk Stratification   Activity Barriers Left Knee Replacement;Arthritis;Deconditioning;Muscular Weakness;Shortness of Breath   arthritis in feet   Cardiac Risk Stratification Moderate             6 Minute Walk:  6 Minute Walk     Row Name 03/03/22 1507         6 Minute Walk   Phase Initial     Distance 1040 feet  Walk Time 6 minutes     # of Rest Breaks 0      MPH 197     METS 1.12     RPE 11     Perceived Dyspnea  2     VO2 Peak 5.31     Symptoms Yes (comment)     Comments SOB     Resting HR 61 bpm     Resting BP 128/64     Resting Oxygen Saturation  96 %     Exercise Oxygen Saturation  during 6 min walk 97 %     Max Ex. HR 105 bpm     Max Ex. BP 136/74     2 Minute Post BP 122/60              Oxygen Initial Assessment:   Oxygen Re-Evaluation:   Oxygen Discharge (Final Oxygen Re-Evaluation):   Initial Exercise Prescription:  Initial Exercise Prescription - 03/03/22 1500       Date of Initial Exercise RX and Referring Provider   Date 03/03/22    Referring Provider Ruther[place], Vicente Males MD ([place])      Oxygen   Maintain Oxygen Saturation 88% or higher      Recumbant Bike   Level 1    RPM 50    Watts 5    Minutes 15    METs 2      NuStep   Level 1    SPM 80    Minutes 15    METs 2      T5 Nustep   Level 1    SPM 80    Minutes 15    METs 2      Biostep-RELP   Level 1    SPM 50    Minutes 15    METs 2      Track   Laps 27    Minutes 15    METs 2.47      Prescription Details   Frequency (times per week) 3    Duration Progress to 30 minutes of continuous aerobic without signs/symptoms of physical distress      Intensity   THRR 40-80% of Max Heartrate 92-123    Ratings of Perceived Exertion 11-13    Perceived Dyspnea 0-4      Progression   Progression Continue to progress workloads to maintain intensity without signs/symptoms of physical distress.      Resistance Training   Training Prescription Yes    Weight 5 lb    Reps 10-15             Perform Capillary Blood Glucose checks as needed.  Exercise Prescription Changes:   Exercise Prescription Changes     Row Name 03/03/22 1500             Response to Exercise   Blood Pressure (Admit) 128/64       Blood Pressure (Exercise) 136/74       Blood Pressure (Exit) 122/60       Heart Rate (Admit) 61 bpm       Heart Rate (Exercise) 105  bpm       Heart Rate (Exit) 75 bpm       Oxygen Saturation (Admit) 96 %       Oxygen Saturation (Exercise) 97 %       Rating of Perceived Exertion (Exercise) 11       Perceived Dyspnea (Exercise) 2       Symptoms SOB  Comments walk test results                Exercise Comments:   Exercise Goals and Review:   Exercise Goals     Row Name 03/03/22 1512             Exercise Goals   Increase Physical Activity Yes       Intervention Provide advice, education, support and counseling about physical activity/exercise needs.;Develop an individualized exercise prescription for aerobic and resistive training based on initial evaluation findings, risk stratification, comorbidities and participant's personal goals.       Expected Outcomes Short Term: Attend rehab on a regular basis to increase amount of physical activity.;Long Term: Add in home exercise to make exercise part of routine and to increase amount of physical activity.;Long Term: Exercising regularly at least 3-5 days a week.       Increase Strength and Stamina Yes       Intervention Provide advice, education, support and counseling about physical activity/exercise needs.;Develop an individualized exercise prescription for aerobic and resistive training based on initial evaluation findings, risk stratification, comorbidities and participant's personal goals.       Expected Outcomes Short Term: Increase workloads from initial exercise prescription for resistance, speed, and METs.;Short Term: Perform resistance training exercises routinely during rehab and add in resistance training at home;Long Term: Improve cardiorespiratory fitness, muscular endurance and strength as measured by increased METs and functional capacity (6MWT)       Able to understand and use rate of perceived exertion (RPE) scale Yes       Intervention Provide education and explanation on how to use RPE scale       Expected Outcomes Short Term: Able to use RPE  daily in rehab to express subjective intensity level;Long Term:  Able to use RPE to guide intensity level when exercising independently       Able to understand and use Dyspnea scale Yes       Intervention Provide education and explanation on how to use Dyspnea scale       Expected Outcomes Short Term: Able to use Dyspnea scale daily in rehab to express subjective sense of shortness of breath during exertion;Long Term: Able to use Dyspnea scale to guide intensity level when exercising independently       Knowledge and understanding of Target Heart Rate Range (THRR) Yes       Intervention Provide education and explanation of THRR including how the numbers were predicted and where they are located for reference       Expected Outcomes Short Term: Able to state/look up THRR;Short Term: Able to use daily as guideline for intensity in rehab;Long Term: Able to use THRR to govern intensity when exercising independently       Able to check pulse independently Yes       Intervention Provide education and demonstration on how to check pulse in carotid and radial arteries.;Review the importance of being able to check your own pulse for safety during independent exercise       Expected Outcomes Short Term: Able to explain why pulse checking is important during independent exercise;Long Term: Able to check pulse independently and accurately       Understanding of Exercise Prescription Yes       Intervention Provide education, explanation, and written materials on patient's individual exercise prescription       Expected Outcomes Short Term: Able to explain program exercise prescription;Long Term: Able to explain home exercise prescription to  exercise independently                Exercise Goals Re-Evaluation :   Discharge Exercise Prescription (Final Exercise Prescription Changes):  Exercise Prescription Changes - 03/03/22 1500       Response to Exercise   Blood Pressure (Admit) 128/64    Blood  Pressure (Exercise) 136/74    Blood Pressure (Exit) 122/60    Heart Rate (Admit) 61 bpm    Heart Rate (Exercise) 105 bpm    Heart Rate (Exit) 75 bpm    Oxygen Saturation (Admit) 96 %    Oxygen Saturation (Exercise) 97 %    Rating of Perceived Exertion (Exercise) 11    Perceived Dyspnea (Exercise) 2    Symptoms SOB    Comments walk test results             Nutrition:  Target Goals: Understanding of nutrition guidelines, daily intake of sodium '1500mg'$ , cholesterol '200mg'$ , calories 30% from fat and 7% or less from saturated fats, daily to have 5 or more servings of fruits and vegetables.  Education: All About Nutrition: -Group instruction provided by verbal, written material, interactive activities, discussions, models, and posters to present general guidelines for heart healthy nutrition including fat, fiber, MyPlate, the role of sodium in heart healthy nutrition, utilization of the nutrition label, and utilization of this knowledge for meal planning. Follow up email sent as well. Written material given at graduation. Flowsheet Row Cardiac Rehab from 03/03/2022 in [facility] Cardiac and Pulmonary Rehab  Education need identified 03/03/22       Biometrics:  Pre Biometrics - 03/03/22 1513       Pre Biometrics   Height 5' 5.9" (1.674 m)    Weight 213 lb 4.8 oz (96.8 kg)    Waist Circumference 44.75 inches    Hip Circumference 41.5 inches    Waist to Hip Ratio 1.08 %    BMI (Calculated) 34.53    Single Leg Stand 4.9 seconds              Nutrition Therapy Plan and Nutrition Goals:  Nutrition Therapy & Goals - 03/03/22 1042       Nutrition Therapy   Diet Heart healthy, low Na, T2DM    Protein (specify units) 85-95g    Fiber 28 grams    Whole Grain Foods 3 servings    Saturated Fats 16 max. grams    Fruits and Vegetables 8 servings/day    Sodium 2 grams      Personal Nutrition Goals   Nutrition Goal ST: LT: limit sodium <2g/day, limit saturated fat <16g, follow MyPlate  guidelines    Comments 81 y.o. M admitted to cardiac rehab s/p stent placement.PMHx includes HTN, CAD, CHF, a.fib, OSA, T2DM, hypothyroidism. Relevant medications includes apple cider vinegar pill, calcium, vitamin D3, CoQ10, B-12, ferrous sulfate, vitamin C, cinnamon, magnesium, potassium, red yeast extract, B-6, B-2, thiamine, tramadol, zinc, senokot, acai berry PO, alfalfa PO, MSM. Pt reports taking many supplements for a long time; discussed how many can be unnecessary, it is important to get our nutrients from food first when we can, supplements should be third party tested, you can exceed recommended doeses especially with fat soluble vitamins such as vitamin D, and to check with your health care provider before starting any supplements as they can also interact with medications and medical conditions, pt voiced understanding. B: scrambled eggs or grits or oatmeal L: vegetable plate at restaurant (macoroni salad, green beans, peaches). He does not have raw  red tomato as that bothers his gums. He limits bread and tries to not eat it. D: [employer] chicken on tuesday, vegetables when he goes out to eat. He also limits his dessert. He reports cooking about 2x/week. Wilgus reports he would like to lose weight around his midsection - discussed how we cannot spot-reduce fat, aging can cause natural body composition changes, and some behavior changes that can help such as including fiber, reducing excess fat and added sugar, and honoring hunger. Reviewed heart healthy nutrition and diabetes guidelines. Discussed how going out to eat can make it hard to include whole grains, include variety, limit saturated fat, and limit sodium. Discussed some tips on how to eat more heart healthy options when out to eat such as including more non-starchy vegetables, choosing more lean proteins such as chicken, limiting red meat, limiting creamy sauces/dressings and limiting fried or breaded foods.      Intervention Plan    Intervention Prescribe, educate and counsel regarding individualized specific dietary modifications aiming towards targeted core components such as weight, hypertension, lipid management, diabetes, heart failure and other comorbidities.;Nutrition handout(s) given to patient.    Expected Outcomes Short Term Goal: Understand basic principles of dietary content, such as calories, fat, sodium, cholesterol and nutrients.;Short Term Goal: A plan has been developed with personal nutrition goals set during dietitian appointment.;Long Term Goal: Adherence to prescribed nutrition plan.             Nutrition Assessments:  MEDIFICTS Score Key: ?70 Need to make dietary changes  40-70 Heart Healthy Diet ? 40 Therapeutic Level Cholesterol Diet  Flowsheet Row Cardiac Rehab from 03/03/2022 in [facility] Cardiac and Pulmonary Rehab  Picture Your Plate Total Score on Admission 69      Picture Your Plate Scores: <03 Unhealthy dietary pattern with much room for improvement. 41-50 Dietary pattern unlikely to meet recommendations for good health and room for improvement. 51-60 More healthful dietary pattern, with some room for improvement.  >60 Healthy dietary pattern, although there may be some specific behaviors that could be improved.    Nutrition Goals Re-Evaluation:   Nutrition Goals Discharge (Final Nutrition Goals Re-Evaluation):   Psychosocial: Target Goals: Acknowledge presence or absence of significant depression and/or stress, maximize coping skills, provide positive support system. Participant is able to verbalize types and ability to use techniques and skills needed for reducing stress and depression.   Education: Stress, Anxiety, and Depression - Group verbal and visual presentation to define topics covered.  Reviews how body is impacted by stress, anxiety, and depression.  Also discusses healthy ways to reduce stress and to treat/manage anxiety and depression.  Written material given at  graduation. Flowsheet Row Cardiac Rehab from 11/30/2019 in [facility] Cardiac and Pulmonary Rehab  Date 11/09/19  Educator SB  Instruction Review Code 1- [employer] Understanding       Education: Sleep Hygiene -Provides group verbal and written instruction about how sleep can affect your health.  Define sleep hygiene, discuss sleep cycles and impact of sleep habits. Review good sleep hygiene tips.    Initial Review & Psychosocial Screening:  Initial Psych Review & Screening - 02/19/22 1437       Initial Review   Current issues with None Identified      Family Dynamics   Good Support System? Yes    Comments His wife is his support system. He has done the program before and is ready to get healthier.      Barriers   Psychosocial barriers to participate  in program There are no identifiable barriers or psychosocial needs.;The patient should benefit from training in stress management and relaxation.      Screening Interventions   Interventions Encouraged to exercise;To provide support and resources with identified psychosocial needs;Provide feedback about the scores to participant    Expected Outcomes Short Term goal: Utilizing psychosocial counselor, staff and physician to assist with identification of specific Stressors or current issues interfering with healing process. Setting desired goal for each stressor or current issue identified.;Short Term goal: Identification and review with participant of any Quality of Life or Depression concerns found by scoring the questionnaire.;Long Term Goal: Stressors or current issues are controlled or eliminated.;Long Term goal: The participant improves quality of Life and PHQ9 Scores as seen by post scores and/or verbalization of changes             Quality of Life Scores:   Quality of Life - 03/03/22 1513       Quality of Life   Select Quality of Life      Quality of Life Scores   Health/Function Pre 17.5 %    Socioeconomic Pre 19.71 %     Psych/Spiritual Pre 22.79 %    Family Pre 18 %    GLOBAL Pre 19.12 %            Scores of 19 and below usually indicate a poorer quality of life in these areas.  A difference of  2-3 points is a clinically meaningful difference.  A difference of 2-3 points in the total score of the Quality of Life Index has been associated with significant improvement in overall quality of life, self-image, physical symptoms, and general health in studies assessing change in quality of life.  PHQ-9: Review Flowsheet       03/03/2022 10/27/2019 02/26/2012  Depression screen PHQ 2/9  Decreased Interest 0 0 0  Down, Depressed, Hopeless 0 0 0  PHQ - 2 Score 0 0 0  Altered sleeping 0 0 -  Tired, decreased energy 0 0 -  Change in appetite 0 0 -  Feeling bad or failure about yourself  0 0 -  Trouble concentrating 0 0 -  Moving slowly or fidgety/restless 0 0 -  Suicidal thoughts 0 0 -  PHQ-9 Score 0 0 -  Difficult doing work/chores Not difficult at all Not difficult at all -   Interpretation of Total Score  Total Score Depression Severity:  1-4 = Minimal depression, 5-9 = Mild depression, 10-14 = Moderate depression, 15-19 = Moderately severe depression, 20-27 = Severe depression   Psychosocial Evaluation and Intervention:  Psychosocial Evaluation - 02/19/22 1438       Psychosocial Evaluation & Interventions   Interventions Stress management education;Relaxation education;Encouraged to exercise with the program and follow exercise prescription    Comments His wife is his support system. He has done the program before and is ready to get healthier.    Expected Outcomes Short: attend cardiac rehab for exercise and education. Long: develop positive self care habits.    Continue Psychosocial Services  Follow up required by staff             Psychosocial Re-Evaluation:   Psychosocial Discharge (Final Psychosocial Re-Evaluation):   Vocational Rehabilitation: Provide vocational rehab  assistance to qualifying candidates.   Vocational Rehab Evaluation & Intervention:   Education: Education Goals: Education classes will be provided on a variety of topics geared toward better understanding of heart health and risk factor modification. Participant will state understanding/return  demonstration of topics presented as noted by education test scores.  Learning Barriers/Preferences:  Learning Barriers/Preferences - 02/19/22 1436       Learning Barriers/Preferences   Learning Barriers None    Learning Preferences None             General Cardiac Education Topics:  AED/CPR: - Group verbal and written instruction with the use of models to demonstrate the basic use of the AED with the basic ABC's of resuscitation.   Anatomy and Cardiac Procedures: - Group verbal and visual presentation and models provide information about basic cardiac anatomy and function. Reviews the testing methods done to diagnose heart disease and the outcomes of the test results. Describes the treatment choices: Medical Management, Angioplasty, or Coronary Bypass Surgery for treating various heart conditions including Myocardial Infarction, Angina, Valve Disease, and Cardiac Arrhythmias.  Written material given at graduation.   Medication Safety: - Group verbal and visual instruction to review commonly prescribed medications for heart and lung disease. Reviews the medication, class of the drug, and side effects. Includes the steps to properly store meds and maintain the prescription regimen.  Written material given at graduation.   Intimacy: - Group verbal instruction through game format to discuss how heart and lung disease can affect sexual intimacy. Written material given at graduation..   Know Your Numbers and Heart Failure: - Group verbal and visual instruction to discuss disease risk factors for cardiac and pulmonary disease and treatment options.  Reviews associated critical values for  Overweight/Obesity, Hypertension, Cholesterol, and Diabetes.  Discusses basics of heart failure: signs/symptoms and treatments.  Introduces Heart Failure Zone chart for action plan for heart failure.  Written material given at graduation. Flowsheet Row Cardiac Rehab from 03/03/2022 in [facility] Cardiac and Pulmonary Rehab  Education need identified 03/03/22       Infection Prevention: - Provides verbal and written material to individual with discussion of infection control including proper hand washing and proper equipment cleaning during exercise session. Flowsheet Row Cardiac Rehab from 03/03/2022 in [facility] Cardiac and Pulmonary Rehab  Date 02/19/22  Educator [facility]  Instruction Review Code 1- Verbalizes Understanding       Falls Prevention: - Provides verbal and written material to individual with discussion of falls prevention and safety. Flowsheet Row Cardiac Rehab from 03/03/2022 in [facility] Cardiac and Pulmonary Rehab  Date 02/19/22  Educator [facility]  Instruction Review Code 1- Verbalizes Understanding       Other: -Provides group and verbal instruction on various topics (see comments)   Knowledge Questionnaire Score:  Knowledge Questionnaire Score - 03/03/22 1514       Knowledge Questionnaire Score   Pre Score 22/26             Core Components/Risk Factors/Patient Goals at Admission:  Personal Goals and Risk Factors at Admission - 03/03/22 1515       Core Components/Risk Factors/Patient Goals on Admission    Weight Management Yes;Weight Loss;Obesity    Intervention Weight Management: Develop a combined nutrition and exercise program designed to reach desired caloric intake, while maintaining appropriate intake of nutrient and fiber, sodium and fats, and appropriate energy expenditure required for the weight goal.;Weight Management: Provide education and appropriate resources to help participant work on and attain dietary goals.;Weight Management/Obesity: Establish reasonable  short term and long term weight goals.;Obesity: Provide education and appropriate resources to help participant work on and attain dietary goals.    Admit Weight 213 lb 4.8 oz (96.8 kg)    Goal Weight: Short Term  210 lb (95.3 kg)    Goal Weight: Long Term 205 lb (93 kg)    Expected Outcomes Short Term: Continue to assess and modify interventions until short term weight is achieved;Long Term: Adherence to nutrition and physical activity/exercise program aimed toward attainment of established weight goal;Weight Loss: Understanding of general recommendations for a balanced deficit meal plan, which promotes 1-2 lb weight loss per week and includes a negative energy balance of [phone] kcal/d;Understanding recommendations for meals to include 15-35% energy as protein, 25-35% energy from fat, 35-60% energy from carbohydrates, less than '200mg'$  of dietary cholesterol, 20-35 gm of total fiber daily;Understanding of distribution of calorie intake throughout the day with the consumption of 4-5 meals/snacks    Improve shortness of breath with ADL's Yes    Intervention Provide education, individualized exercise plan and daily activity instruction to help decrease symptoms of SOB with activities of daily living.    Expected Outcomes Short Term: Improve cardiorespiratory fitness to achieve a reduction of symptoms when performing ADLs;Long Term: Be able to perform more ADLs without symptoms or delay the onset of symptoms    Diabetes Yes    Intervention Provide education about signs/symptoms and action to take for hypo/hyperglycemia.;Provide education about proper nutrition, including hydration, and aerobic/resistive exercise prescription along with prescribed medications to achieve blood glucose in normal ranges: Fasting glucose 65-99 mg/dL    Expected Outcomes Short Term: Participant verbalizes understanding of the signs/symptoms and immediate care of hyper/hypoglycemia, proper foot care and importance of medication,  aerobic/resistive exercise and nutrition plan for blood glucose control.;Long Term: Attainment of HbA1C < 7%.    Hypertension Yes    Intervention Provide education on lifestyle modifcations including regular physical activity/exercise, weight management, moderate sodium restriction and increased consumption of fresh fruit, vegetables, and low fat dairy, alcohol moderation, and smoking cessation.;Monitor prescription use compliance.    Expected Outcomes Short Term: Continued assessment and intervention until BP is < 140/1m HG in hypertensive participants. < 130/876mHG in hypertensive participants with diabetes, heart failure or chronic kidney disease.;Long Term: Maintenance of blood pressure at goal levels.    Lipids Yes    Intervention Provide education and support for participant on nutrition & aerobic/resistive exercise along with prescribed medications to achieve LDL '70mg'$ , HDL >'40mg'$ .    Expected Outcomes Short Term: Participant states understanding of desired cholesterol values and is compliant with medications prescribed. Participant is following exercise prescription and nutrition guidelines.;Long Term: Cholesterol controlled with medications as prescribed, with individualized exercise RX and with personalized nutrition plan. Value goals: LDL < '70mg'$ , HDL > 40 mg.             Education:Diabetes - Individual verbal and written instruction to review signs/symptoms of diabetes, desired ranges of glucose level fasting, after meals and with exercise. Acknowledge that pre and post exercise glucose checks will be done for 3 sessions at entry of program. [facility]  Date 03/03/22  [facility] Code 1- Verbalizes Understanding       Core Components/Risk Factors/Patient Goals Review:    Core Components/Risk Factors/Patient Goals at Discharge (Final Review):    ITP Comments:  ITP Comments     Row  Name 02/19/22 1440 03/03/22 1507         ITP Comments Virtual Visit completed. Patient informed on EP and RD appointment and 6 Minute walk test. Patient also informed of patient health questionnaires on My Chart. Patient Verbalizes understanding. Visit diagnosis can  be found in [facility] 02/02/2022. Completed 6MWT and gym orientation. Initial ITP created and sent for review to Dr. Emily Filbert, Medical Director.               Comments: Initial ITP

## 2022-03-05 ENCOUNTER — Encounter: Payer: Self-pay | Admitting: *Deleted

## 2022-03-05 DIAGNOSIS — Z955 Presence of coronary angioplasty implant and graft: Secondary | ICD-10-CM

## 2022-03-05 NOTE — Progress Notes (Signed)
Cardiac Individual Treatment Plan  Patient Details  Name: LANDAN FEDIE MRN: [id number] Date of Birth: [birth year] Referring Provider:   Flowsheet Row Cardiac Rehab from 03/03/2022 in [facility] Cardiac and Pulmonary Rehab  Referring Provider Lendon Collar MD ([place])       Initial Encounter Date:  Flowsheet Row Cardiac Rehab from 03/03/2022 in [facility] Cardiac and Pulmonary Rehab  Date 03/03/22       Visit Diagnosis: Status post coronary artery stent placement  Patient's Home Medications on Admission:  Current Outpatient Medications:    ACAI BERRY PO, Take 3,000 mg by mouth daily., Disp: , Rfl:    albuterol (VENTOLIN HFA) 108 (90 Base) MCG/ACT inhaler, Inhale 2 puffs into the lungs every 6 (six) hours as needed for wheezing or shortness of breath., Disp: , Rfl:    ALFALFA PO, Take 1,300 mg by mouth 2 (two) times daily., Disp: , Rfl:    allopurinol (ZYLOPRIM) 300 MG tablet, Take 300 mg by mouth daily., Disp: , Rfl:    amLODipine (NORVASC) 2.5 MG tablet, Take 2.5 mg by mouth daily as needed (If systolic BP over 226.)., Disp: , Rfl:    ammonium lactate (LAC-HYDRIN) 12 % lotion, Apply 1 application topically daily as needed for dry skin., Disp: , Rfl:    ammonium lactate (LAC-HYDRIN) 12 % lotion, APPLY SMALL AMOUNT TOPICALLY IN THE MORNING FOR DRY SKIN (Patient not taking: Reported on 02/19/2022), Disp: , Rfl:    apixaban (ELIQUIS) 5 MG TABS tablet, TAKE ONE TABLET BY MOUTH EVERY 12 HOURS CAUTION BLOOD THINNER THIS REPLACES YOUR WARFARIN, Disp: , Rfl:    APPLE CIDER VINEGAR PO, Take 480 mg by mouth daily., Disp: , Rfl:    Ascorbic Acid (VITAMIN C) 1000 MG tablet, Take 1,000 mg by mouth daily., Disp: , Rfl:    aspirin EC 81 MG tablet, Take 81 mg by mouth daily., Disp: , Rfl:    benzonatate (TESSALON) 100 MG capsule, Take 1 capsule by mouth 3 (three) times daily as needed., Disp: , Rfl:    Calcium Carb-Cholecalciferol (CALCIUM/VITAMIN D) 600-400 MG-UNIT TABS, Take 1 tablet by mouth 2 (two)  times daily. (Patient not taking: Reported on 02/19/2022), Disp: , Rfl:    Calcium Carbonate-Vit D-Min (CALCIUM 600+D PLUS MINERALS) 600-400 MG-UNIT TABS, Take 1 tablet by mouth 2 (two) times daily., Disp: , Rfl:    carvedilol (COREG) 6.25 MG tablet, Take by mouth 2 (two) times daily with a meal., Disp: , Rfl:    Cholecalciferol (VITAMIN D3) 250 MCG (10000 UT) capsule, Take 10,000 Units by mouth daily., Disp: , Rfl:    Cholecalciferol 125 MCG (5000 UT) TABS, Take by mouth., Disp: , Rfl:    Cinnamon 500 MG capsule, Take 500 mg by mouth daily., Disp: , Rfl:    Coenzyme Q10 200 MG capsule, Take 200 mg by mouth daily., Disp: , Rfl:    cyanocobalamin 100 MCG tablet, TAKE ONE TABLET BY MOUTH EVERY DAY TO PREVENT VITAMIN B12 DEFICIENCY (Patient not taking: Reported on 02/19/2022), Disp: , Rfl:    cyanocobalamin 1000 MCG tablet, Take 1,000 mcg by mouth daily., Disp: , Rfl:    ezetimibe (ZETIA) 10 MG tablet, Take 10 mg by mouth daily., Disp: , Rfl:    ferrous sulfate 325 (65 FE) MG tablet, Take 325 mg by mouth daily with breakfast., Disp: , Rfl:    fluticasone (FLONASE) 50 MCG/ACT nasal spray, Place 2 sprays into both nostrils daily as needed for rhinitis. (Patient not taking: Reported on 02/19/2022), Disp: , Rfl:  fluticasone (FLONASE) 50 MCG/ACT nasal spray, Place into the nose., Disp: , Rfl:    Glucosamine-Chondroit-Vit C-Mn (GLUCOSAMINE CHONDR 500 COMPLEX) CAPS, Take 2 capsules by mouth 2 (two) times daily., Disp: , Rfl:    lidocaine (LIDODERM) 5 %, Place onto the skin., Disp: , Rfl:    MAGNESIUM PO, Take by mouth., Disp: , Rfl:    niacin 500 MG CR capsule, Take 500 mg by mouth at bedtime. B3, Disp: , Rfl:    Potassium 99 MG TABS, Take 99 mg by mouth 2 (two) times daily., Disp: , Rfl:    potassium gluconate 595 (99 K) MG TABS tablet, Take by mouth., Disp: , Rfl:    pyridOXINE (B-6) 50 MG tablet, Take 100 mg by mouth daily., Disp: , Rfl:    pyridOXINE (B-6) 50 MG tablet, Take 2 tablets by mouth  daily. (Patient not taking: Reported on 02/19/2022), Disp: , Rfl:    RED YEAST RICE EXTRACT PO, Take 600 mg by mouth 2 (two) times daily., Disp: , Rfl:    riboflavin (VITAMIN B-2) 100 MG TABS tablet, Take 100 mg by mouth daily., Disp: , Rfl:    senna-docusate (SENOKOT-S) 8.6-50 MG tablet, Take 1 tablet by mouth 2 (two) times daily., Disp: , Rfl:    Skin Protectants, Misc. (HYDROCERIN EX), Apply 1 application topically daily as needed (dry skin)., Disp: , Rfl:    Skin Protectants, Misc. (MINERIN CREME EX), Apply topically., Disp: , Rfl:    spironolactone (ALDACTONE) 25 MG tablet, Take 12.5 mg by mouth daily. (Patient not taking: Reported on 02/19/2022), Disp: , Rfl:    spironolactone (ALDACTONE) 25 MG tablet, Take 0.5 tablets by mouth daily., Disp: , Rfl:    tamsulosin (FLOMAX) 0.4 MG CAPS capsule, Take 0.4 mg by mouth at bedtime. (Patient not taking: Reported on 02/19/2022), Disp: , Rfl:    tamsulosin (FLOMAX) 0.4 MG CAPS capsule, Take 1 capsule by mouth at bedtime., Disp: , Rfl:    thiamine 100 MG tablet, Take 100 mg by mouth daily., Disp: , Rfl:    traMADol (ULTRAM) 50 MG tablet, Take 50 mg by mouth every 6 (six) hours as needed for moderate pain., Disp: , Rfl:    trospium (SANCTURA) 20 MG tablet, Take 20 mg by mouth 2 (two) times daily., Disp: , Rfl:    warfarin (COUMADIN) 1 MG tablet, Take 3 mg by mouth at bedtime., Disp: , Rfl:    zinc gluconate 50 MG tablet, Take 50 mg by mouth daily., Disp: , Rfl:   Past Medical History: Past Medical History:  Diagnosis Date   Agent orange exposure    Atrial fibrillation ([place]) 2013   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   CHF (congestive heart failure) (HCC)    COPD (chronic obstructive pulmonary disease) (HCC)    Coronary artery disease    Diabetes mellitus without complication (HCC)    diet controlled   Dysrhythmia     A fib   Heart murmur    / 1 MD said yes, 1 said no   History of blood transfusion    during CABG   History of kidney stones     Hyperlipidemia    Hypertension    Left knee DJD    MRSA (methicillin resistant staph aureus) culture positive    Drainage from Left Knee approx 2005   Myocardial infarction ([place])    mild in 1998 per Wife    PONV (postoperative nausea and vomiting)    S/P coronary artery bypass graft x  2    1998   Shortness of breath    Sleep apnea    CPAP   Stented coronary artery     Tobacco Use: Social History   Tobacco Use  Smoking Status Never  Smokeless Tobacco Never  Tobacco Comments   father smoked in the home while pt was growing up//ex-wife smoked    Labs: Review Flowsheet       Latest Ref Rng & Units 03/28/2011 01/14/2012 02/25/2012 06/04/2020  Labs for ITP Cardiac and Pulmonary Rehab  Cholestrol 0 - 200 mg/dL 179  158  - -  LDL (calc) 0 - 99 mg/dL 124  110  - -  HDL-C >39.00 mg/dL 26.00  24.00  - -  Trlycerides 0.0 - 149.0 mg/dL 146.0  122.0  - -  Hemoglobin A1c 4.8 - 5.6 % - - 6.3  6.1      Exercise Target Goals: Exercise Program Goal: Individual exercise prescription set using results from initial 6 min walk test and THRR while considering  patient's activity barriers and safety.   Exercise Prescription Goal: Initial exercise prescription builds to 30-45 minutes a day of aerobic activity, 2-3 days per week.  Home exercise guidelines will be given to patient during program as part of exercise prescription that the participant will acknowledge.   Education: Aerobic Exercise: - Group verbal and visual presentation on the components of exercise prescription. Introduces F.I.T.T principle from ACSM for exercise prescriptions.  Reviews F.I.T.T. principles of aerobic exercise including progression. Written material given at graduation. Flowsheet Row Cardiac Rehab from 03/03/2022 in [facility] Cardiac and Pulmonary Rehab  Education need identified 03/03/22       Education: Resistance Exercise: - Group verbal and visual presentation on the components of exercise prescription.  Introduces F.I.T.T principle from ACSM for exercise prescriptions  Reviews F.I.T.T. principles of resistance exercise including progression. Written material given at graduation.    Education: Exercise & Equipment Safety: - Individual verbal instruction and demonstration of equipment use and safety with use of the equipment. Flowsheet Row Cardiac Rehab from 03/03/2022 in [facility] Cardiac and Pulmonary Rehab  Date 02/19/22  Educator [facility]  Instruction Review Code 1- Verbalizes Understanding       Education: Exercise Physiology & General Exercise Guidelines: - Group verbal and written instruction with models to review the exercise physiology of the cardiovascular system and associated critical values. Provides general exercise guidelines with specific guidelines to those with heart or lung disease.    Education: Flexibility, Balance, Mind/Body Relaxation: - Group verbal and visual presentation with interactive activity on the components of exercise prescription. Introduces F.I.T.T principle from ACSM for exercise prescriptions. Reviews F.I.T.T. principles of flexibility and balance exercise training including progression. Also discusses the mind body connection.  Reviews various relaxation techniques to help reduce and manage stress (i.e. Deep breathing, progressive muscle relaxation, and visualization). Balance handout provided to take home. Written material given at graduation.   Activity Barriers & Risk Stratification:  Activity Barriers & Cardiac Risk Stratification - 03/03/22 1507       Activity Barriers & Cardiac Risk Stratification   Activity Barriers Left Knee Replacement;Arthritis;Deconditioning;Muscular Weakness;Shortness of Breath   arthritis in feet   Cardiac Risk Stratification Moderate             6 Minute Walk:  6 Minute Walk     Row Name 03/03/22 1507         6 Minute Walk   Phase Initial     Distance 1040 feet  Walk Time 6 minutes     # of Rest Breaks 0      MPH 197     METS 1.12     RPE 11     Perceived Dyspnea  2     VO2 Peak 5.31     Symptoms Yes (comment)     Comments SOB     Resting HR 61 bpm     Resting BP 128/64     Resting Oxygen Saturation  96 %     Exercise Oxygen Saturation  during 6 min walk 97 %     Max Ex. HR 105 bpm     Max Ex. BP 136/74     2 Minute Post BP 122/60              Oxygen Initial Assessment:   Oxygen Re-Evaluation:   Oxygen Discharge (Final Oxygen Re-Evaluation):   Initial Exercise Prescription:  Initial Exercise Prescription - 03/03/22 1500       Date of Initial Exercise RX and Referring Provider   Date 03/03/22    Referring Provider Ruther[place], Vicente Males MD ([place])      Oxygen   Maintain Oxygen Saturation 88% or higher      Recumbant Bike   Level 1    RPM 50    Watts 5    Minutes 15    METs 2      NuStep   Level 1    SPM 80    Minutes 15    METs 2      T5 Nustep   Level 1    SPM 80    Minutes 15    METs 2      Biostep-RELP   Level 1    SPM 50    Minutes 15    METs 2      Track   Laps 27    Minutes 15    METs 2.47      Prescription Details   Frequency (times per week) 3    Duration Progress to 30 minutes of continuous aerobic without signs/symptoms of physical distress      Intensity   THRR 40-80% of Max Heartrate 92-123    Ratings of Perceived Exertion 11-13    Perceived Dyspnea 0-4      Progression   Progression Continue to progress workloads to maintain intensity without signs/symptoms of physical distress.      Resistance Training   Training Prescription Yes    Weight 5 lb    Reps 10-15             Perform Capillary Blood Glucose checks as needed.  Exercise Prescription Changes:   Exercise Prescription Changes     Row Name 03/03/22 1500             Response to Exercise   Blood Pressure (Admit) 128/64       Blood Pressure (Exercise) 136/74       Blood Pressure (Exit) 122/60       Heart Rate (Admit) 61 bpm       Heart Rate (Exercise) 105  bpm       Heart Rate (Exit) 75 bpm       Oxygen Saturation (Admit) 96 %       Oxygen Saturation (Exercise) 97 %       Rating of Perceived Exertion (Exercise) 11       Perceived Dyspnea (Exercise) 2       Symptoms SOB  Comments walk test results                Exercise Comments:   Exercise Goals and Review:   Exercise Goals     Row Name 03/03/22 1512             Exercise Goals   Increase Physical Activity Yes       Intervention Provide advice, education, support and counseling about physical activity/exercise needs.;Develop an individualized exercise prescription for aerobic and resistive training based on initial evaluation findings, risk stratification, comorbidities and participant's personal goals.       Expected Outcomes Short Term: Attend rehab on a regular basis to increase amount of physical activity.;Long Term: Add in home exercise to make exercise part of routine and to increase amount of physical activity.;Long Term: Exercising regularly at least 3-5 days a week.       Increase Strength and Stamina Yes       Intervention Provide advice, education, support and counseling about physical activity/exercise needs.;Develop an individualized exercise prescription for aerobic and resistive training based on initial evaluation findings, risk stratification, comorbidities and participant's personal goals.       Expected Outcomes Short Term: Increase workloads from initial exercise prescription for resistance, speed, and METs.;Short Term: Perform resistance training exercises routinely during rehab and add in resistance training at home;Long Term: Improve cardiorespiratory fitness, muscular endurance and strength as measured by increased METs and functional capacity (6MWT)       Able to understand and use rate of perceived exertion (RPE) scale Yes       Intervention Provide education and explanation on how to use RPE scale       Expected Outcomes Short Term: Able to use RPE  daily in rehab to express subjective intensity level;Long Term:  Able to use RPE to guide intensity level when exercising independently       Able to understand and use Dyspnea scale Yes       Intervention Provide education and explanation on how to use Dyspnea scale       Expected Outcomes Short Term: Able to use Dyspnea scale daily in rehab to express subjective sense of shortness of breath during exertion;Long Term: Able to use Dyspnea scale to guide intensity level when exercising independently       Knowledge and understanding of Target Heart Rate Range (THRR) Yes       Intervention Provide education and explanation of THRR including how the numbers were predicted and where they are located for reference       Expected Outcomes Short Term: Able to state/look up THRR;Short Term: Able to use daily as guideline for intensity in rehab;Long Term: Able to use THRR to govern intensity when exercising independently       Able to check pulse independently Yes       Intervention Provide education and demonstration on how to check pulse in carotid and radial arteries.;Review the importance of being able to check your own pulse for safety during independent exercise       Expected Outcomes Short Term: Able to explain why pulse checking is important during independent exercise;Long Term: Able to check pulse independently and accurately       Understanding of Exercise Prescription Yes       Intervention Provide education, explanation, and written materials on patient's individual exercise prescription       Expected Outcomes Short Term: Able to explain program exercise prescription;Long Term: Able to explain home exercise prescription to  exercise independently                Exercise Goals Re-Evaluation :   Discharge Exercise Prescription (Final Exercise Prescription Changes):  Exercise Prescription Changes - 03/03/22 1500       Response to Exercise   Blood Pressure (Admit) 128/64    Blood  Pressure (Exercise) 136/74    Blood Pressure (Exit) 122/60    Heart Rate (Admit) 61 bpm    Heart Rate (Exercise) 105 bpm    Heart Rate (Exit) 75 bpm    Oxygen Saturation (Admit) 96 %    Oxygen Saturation (Exercise) 97 %    Rating of Perceived Exertion (Exercise) 11    Perceived Dyspnea (Exercise) 2    Symptoms SOB    Comments walk test results             Nutrition:  Target Goals: Understanding of nutrition guidelines, daily intake of sodium '1500mg'$ , cholesterol '200mg'$ , calories 30% from fat and 7% or less from saturated fats, daily to have 5 or more servings of fruits and vegetables.  Education: All About Nutrition: -Group instruction provided by verbal, written material, interactive activities, discussions, models, and posters to present general guidelines for heart healthy nutrition including fat, fiber, MyPlate, the role of sodium in heart healthy nutrition, utilization of the nutrition label, and utilization of this knowledge for meal planning. Follow up email sent as well. Written material given at graduation. Flowsheet Row Cardiac Rehab from 03/03/2022 in [facility] Cardiac and Pulmonary Rehab  Education need identified 03/03/22       Biometrics:  Pre Biometrics - 03/03/22 1513       Pre Biometrics   Height 5' 5.9" (1.674 m)    Weight 213 lb 4.8 oz (96.8 kg)    Waist Circumference 44.75 inches    Hip Circumference 41.5 inches    Waist to Hip Ratio 1.08 %    BMI (Calculated) 34.53    Single Leg Stand 4.9 seconds              Nutrition Therapy Plan and Nutrition Goals:  Nutrition Therapy & Goals - 03/03/22 1042       Nutrition Therapy   Diet Heart healthy, low Na, T2DM    Protein (specify units) 85-95g    Fiber 28 grams    Whole Grain Foods 3 servings    Saturated Fats 16 max. grams    Fruits and Vegetables 8 servings/day    Sodium 2 grams      Personal Nutrition Goals   Nutrition Goal ST: LT: limit sodium <2g/day, limit saturated fat <16g, follow MyPlate  guidelines    Comments 81 y.o. M admitted to cardiac rehab s/p stent placement.PMHx includes HTN, CAD, CHF, a.fib, OSA, T2DM, hypothyroidism. Relevant medications includes apple cider vinegar pill, calcium, vitamin D3, CoQ10, B-12, ferrous sulfate, vitamin C, cinnamon, magnesium, potassium, red yeast extract, B-6, B-2, thiamine, tramadol, zinc, senokot, acai berry PO, alfalfa PO, MSM. Pt reports taking many supplements for a long time; discussed how many can be unnecessary, it is important to get our nutrients from food first when we can, supplements should be third party tested, you can exceed recommended doeses especially with fat soluble vitamins such as vitamin D, and to check with your health care provider before starting any supplements as they can also interact with medications and medical conditions, pt voiced understanding. B: scrambled eggs or grits or oatmeal L: vegetable plate at restaurant (macoroni salad, green beans, peaches). He does not have raw  red tomato as that bothers his gums. He limits bread and tries to not eat it. D: [employer] chicken on tuesday, vegetables when he goes out to eat. He also limits his dessert. He reports cooking about 2x/week. Brandonlee reports he would like to lose weight around his midsection - discussed how we cannot spot-reduce fat, aging can cause natural body composition changes, and some behavior changes that can help such as including fiber, reducing excess fat and added sugar, and honoring hunger. Reviewed heart healthy nutrition and diabetes guidelines. Discussed how going out to eat can make it hard to include whole grains, include variety, limit saturated fat, and limit sodium. Discussed some tips on how to eat more heart healthy options when out to eat such as including more non-starchy vegetables, choosing more lean proteins such as chicken, limiting red meat, limiting creamy sauces/dressings and limiting fried or breaded foods.      Intervention Plan    Intervention Prescribe, educate and counsel regarding individualized specific dietary modifications aiming towards targeted core components such as weight, hypertension, lipid management, diabetes, heart failure and other comorbidities.;Nutrition handout(s) given to patient.    Expected Outcomes Short Term Goal: Understand basic principles of dietary content, such as calories, fat, sodium, cholesterol and nutrients.;Short Term Goal: A plan has been developed with personal nutrition goals set during dietitian appointment.;Long Term Goal: Adherence to prescribed nutrition plan.             Nutrition Assessments:  MEDIFICTS Score Key: ?70 Need to make dietary changes  40-70 Heart Healthy Diet ? 40 Therapeutic Level Cholesterol Diet  Flowsheet Row Cardiac Rehab from 03/03/2022 in [facility] Cardiac and Pulmonary Rehab  Picture Your Plate Total Score on Admission 69      Picture Your Plate Scores: <47 Unhealthy dietary pattern with much room for improvement. 41-50 Dietary pattern unlikely to meet recommendations for good health and room for improvement. 51-60 More healthful dietary pattern, with some room for improvement.  >60 Healthy dietary pattern, although there may be some specific behaviors that could be improved.    Nutrition Goals Re-Evaluation:   Nutrition Goals Discharge (Final Nutrition Goals Re-Evaluation):   Psychosocial: Target Goals: Acknowledge presence or absence of significant depression and/or stress, maximize coping skills, provide positive support system. Participant is able to verbalize types and ability to use techniques and skills needed for reducing stress and depression.   Education: Stress, Anxiety, and Depression - Group verbal and visual presentation to define topics covered.  Reviews how body is impacted by stress, anxiety, and depression.  Also discusses healthy ways to reduce stress and to treat/manage anxiety and depression.  Written material given at  graduation. Flowsheet Row Cardiac Rehab from 11/30/2019 in [facility] Cardiac and Pulmonary Rehab  Date 11/09/19  Educator SB  Instruction Review Code 1- [employer] Understanding       Education: Sleep Hygiene -Provides group verbal and written instruction about how sleep can affect your health.  Define sleep hygiene, discuss sleep cycles and impact of sleep habits. Review good sleep hygiene tips.    Initial Review & Psychosocial Screening:  Initial Psych Review & Screening - 02/19/22 1437       Initial Review   Current issues with None Identified      Family Dynamics   Good Support System? Yes    Comments His wife is his support system. He has done the program before and is ready to get healthier.      Barriers   Psychosocial barriers to participate  in program There are no identifiable barriers or psychosocial needs.;The patient should benefit from training in stress management and relaxation.      Screening Interventions   Interventions Encouraged to exercise;To provide support and resources with identified psychosocial needs;Provide feedback about the scores to participant    Expected Outcomes Short Term goal: Utilizing psychosocial counselor, staff and physician to assist with identification of specific Stressors or current issues interfering with healing process. Setting desired goal for each stressor or current issue identified.;Short Term goal: Identification and review with participant of any Quality of Life or Depression concerns found by scoring the questionnaire.;Long Term Goal: Stressors or current issues are controlled or eliminated.;Long Term goal: The participant improves quality of Life and PHQ9 Scores as seen by post scores and/or verbalization of changes             Quality of Life Scores:   Quality of Life - 03/03/22 1513       Quality of Life   Select Quality of Life      Quality of Life Scores   Health/Function Pre 17.5 %    Socioeconomic Pre 19.71 %     Psych/Spiritual Pre 22.79 %    Family Pre 18 %    GLOBAL Pre 19.12 %            Scores of 19 and below usually indicate a poorer quality of life in these areas.  A difference of  2-3 points is a clinically meaningful difference.  A difference of 2-3 points in the total score of the Quality of Life Index has been associated with significant improvement in overall quality of life, self-image, physical symptoms, and general health in studies assessing change in quality of life.  PHQ-9: Review Flowsheet       03/03/2022 10/27/2019 02/26/2012  Depression screen PHQ 2/9  Decreased Interest 0 0 0  Down, Depressed, Hopeless 0 0 0  PHQ - 2 Score 0 0 0  Altered sleeping 0 0 -  Tired, decreased energy 0 0 -  Change in appetite 0 0 -  Feeling bad or failure about yourself  0 0 -  Trouble concentrating 0 0 -  Moving slowly or fidgety/restless 0 0 -  Suicidal thoughts 0 0 -  PHQ-9 Score 0 0 -  Difficult doing work/chores Not difficult at all Not difficult at all -   Interpretation of Total Score  Total Score Depression Severity:  1-4 = Minimal depression, 5-9 = Mild depression, 10-14 = Moderate depression, 15-19 = Moderately severe depression, 20-27 = Severe depression   Psychosocial Evaluation and Intervention:  Psychosocial Evaluation - 02/19/22 1438       Psychosocial Evaluation & Interventions   Interventions Stress management education;Relaxation education;Encouraged to exercise with the program and follow exercise prescription    Comments His wife is his support system. He has done the program before and is ready to get healthier.    Expected Outcomes Short: attend cardiac rehab for exercise and education. Long: develop positive self care habits.    Continue Psychosocial Services  Follow up required by staff             Psychosocial Re-Evaluation:   Psychosocial Discharge (Final Psychosocial Re-Evaluation):   Vocational Rehabilitation: Provide vocational rehab  assistance to qualifying candidates.   Vocational Rehab Evaluation & Intervention:   Education: Education Goals: Education classes will be provided on a variety of topics geared toward better understanding of heart health and risk factor modification. Participant will state understanding/return  demonstration of topics presented as noted by education test scores.  Learning Barriers/Preferences:  Learning Barriers/Preferences - 02/19/22 1436       Learning Barriers/Preferences   Learning Barriers None    Learning Preferences None             General Cardiac Education Topics:  AED/CPR: - Group verbal and written instruction with the use of models to demonstrate the basic use of the AED with the basic ABC's of resuscitation.   Anatomy and Cardiac Procedures: - Group verbal and visual presentation and models provide information about basic cardiac anatomy and function. Reviews the testing methods done to diagnose heart disease and the outcomes of the test results. Describes the treatment choices: Medical Management, Angioplasty, or Coronary Bypass Surgery for treating various heart conditions including Myocardial Infarction, Angina, Valve Disease, and Cardiac Arrhythmias.  Written material given at graduation.   Medication Safety: - Group verbal and visual instruction to review commonly prescribed medications for heart and lung disease. Reviews the medication, class of the drug, and side effects. Includes the steps to properly store meds and maintain the prescription regimen.  Written material given at graduation.   Intimacy: - Group verbal instruction through game format to discuss how heart and lung disease can affect sexual intimacy. Written material given at graduation..   Know Your Numbers and Heart Failure: - Group verbal and visual instruction to discuss disease risk factors for cardiac and pulmonary disease and treatment options.  Reviews associated critical values for  Overweight/Obesity, Hypertension, Cholesterol, and Diabetes.  Discusses basics of heart failure: signs/symptoms and treatments.  Introduces Heart Failure Zone chart for action plan for heart failure.  Written material given at graduation. Flowsheet Row Cardiac Rehab from 03/03/2022 in [facility] Cardiac and Pulmonary Rehab  Education need identified 03/03/22       Infection Prevention: - Provides verbal and written material to individual with discussion of infection control including proper hand washing and proper equipment cleaning during exercise session. Flowsheet Row Cardiac Rehab from 03/03/2022 in [facility] Cardiac and Pulmonary Rehab  Date 02/19/22  Educator [facility]  Instruction Review Code 1- Verbalizes Understanding       Falls Prevention: - Provides verbal and written material to individual with discussion of falls prevention and safety. Flowsheet Row Cardiac Rehab from 03/03/2022 in [facility] Cardiac and Pulmonary Rehab  Date 02/19/22  Educator [facility]  Instruction Review Code 1- Verbalizes Understanding       Other: -Provides group and verbal instruction on various topics (see comments)   Knowledge Questionnaire Score:  Knowledge Questionnaire Score - 03/03/22 1514       Knowledge Questionnaire Score   Pre Score 22/26             Core Components/Risk Factors/Patient Goals at Admission:  Personal Goals and Risk Factors at Admission - 03/03/22 1515       Core Components/Risk Factors/Patient Goals on Admission    Weight Management Yes;Weight Loss;Obesity    Intervention Weight Management: Develop a combined nutrition and exercise program designed to reach desired caloric intake, while maintaining appropriate intake of nutrient and fiber, sodium and fats, and appropriate energy expenditure required for the weight goal.;Weight Management: Provide education and appropriate resources to help participant work on and attain dietary goals.;Weight Management/Obesity: Establish reasonable  short term and long term weight goals.;Obesity: Provide education and appropriate resources to help participant work on and attain dietary goals.    Admit Weight 213 lb 4.8 oz (96.8 kg)    Goal Weight: Short Term  210 lb (95.3 kg)    Goal Weight: Long Term 205 lb (93 kg)    Expected Outcomes Short Term: Continue to assess and modify interventions until short term weight is achieved;Long Term: Adherence to nutrition and physical activity/exercise program aimed toward attainment of established weight goal;Weight Loss: Understanding of general recommendations for a balanced deficit meal plan, which promotes 1-2 lb weight loss per week and includes a negative energy balance of [phone] kcal/d;Understanding recommendations for meals to include 15-35% energy as protein, 25-35% energy from fat, 35-60% energy from carbohydrates, less than '200mg'$  of dietary cholesterol, 20-35 gm of total fiber daily;Understanding of distribution of calorie intake throughout the day with the consumption of 4-5 meals/snacks    Improve shortness of breath with ADL's Yes    Intervention Provide education, individualized exercise plan and daily activity instruction to help decrease symptoms of SOB with activities of daily living.    Expected Outcomes Short Term: Improve cardiorespiratory fitness to achieve a reduction of symptoms when performing ADLs;Long Term: Be able to perform more ADLs without symptoms or delay the onset of symptoms    Diabetes Yes    Intervention Provide education about signs/symptoms and action to take for hypo/hyperglycemia.;Provide education about proper nutrition, including hydration, and aerobic/resistive exercise prescription along with prescribed medications to achieve blood glucose in normal ranges: Fasting glucose 65-99 mg/dL    Expected Outcomes Short Term: Participant verbalizes understanding of the signs/symptoms and immediate care of hyper/hypoglycemia, proper foot care and importance of medication,  aerobic/resistive exercise and nutrition plan for blood glucose control.;Long Term: Attainment of HbA1C < 7%.    Hypertension Yes    Intervention Provide education on lifestyle modifcations including regular physical activity/exercise, weight management, moderate sodium restriction and increased consumption of fresh fruit, vegetables, and low fat dairy, alcohol moderation, and smoking cessation.;Monitor prescription use compliance.    Expected Outcomes Short Term: Continued assessment and intervention until BP is < 140/54m HG in hypertensive participants. < 130/834mHG in hypertensive participants with diabetes, heart failure or chronic kidney disease.;Long Term: Maintenance of blood pressure at goal levels.    Lipids Yes    Intervention Provide education and support for participant on nutrition & aerobic/resistive exercise along with prescribed medications to achieve LDL '70mg'$ , HDL >'40mg'$ .    Expected Outcomes Short Term: Participant states understanding of desired cholesterol values and is compliant with medications prescribed. Participant is following exercise prescription and nutrition guidelines.;Long Term: Cholesterol controlled with medications as prescribed, with individualized exercise RX and with personalized nutrition plan. Value goals: LDL < '70mg'$ , HDL > 40 mg.             Education:Diabetes - Individual verbal and written instruction to review signs/symptoms of diabetes, desired ranges of glucose level fasting, after meals and with exercise. Acknowledge that pre and post exercise glucose checks will be done for 3 sessions at entry of program. [place] [facility]  Date 03/03/22  [facility] Review Code 1- Verbalizes Understanding       Core Components/Risk Factors/Patient Goals Review:    Core Components/Risk Factors/Patient Goals at Discharge (Final Review):    ITP Comments:  ITP Comments     Row  Name 02/19/22 1440 03/03/22 1507 03/05/22 0836       ITP Comments Virtual Visit completed. Patient informed on EP and RD appointment and 6 Minute walk test. Patient also informed of patient health questionnaires on My Chart. Patient Verbalizes understanding. Visit diagnosis can  be found in [facility] 02/02/2022. Completed 6MWT and gym orientation. Initial ITP created and sent for review to Dr. Emily Filbert, Medical Director. 30 Day review completed. Medical Director ITP review done, changes made as directed, and signed approval by Medical Director.   new to program              Comments:

## 2022-03-10 ENCOUNTER — Encounter: Payer: No Typology Code available for payment source | Attending: Family Medicine | Admitting: *Deleted

## 2022-03-10 DIAGNOSIS — Z9889 Other specified postprocedural states: Secondary | ICD-10-CM | POA: Diagnosis not present

## 2022-03-10 DIAGNOSIS — Z955 Presence of coronary angioplasty implant and graft: Secondary | ICD-10-CM | POA: Diagnosis present

## 2022-03-10 DIAGNOSIS — Z48812 Encounter for surgical aftercare following surgery on the circulatory system: Secondary | ICD-10-CM | POA: Diagnosis not present

## 2022-03-10 DIAGNOSIS — I213 ST elevation (STEMI) myocardial infarction of unspecified site: Secondary | ICD-10-CM | POA: Insufficient documentation

## 2022-03-10 LAB — GLUCOSE, CAPILLARY
Glucose-Capillary: 104 mg/dL — ABNORMAL HIGH (ref 70–99)
Glucose-Capillary: 119 mg/dL — ABNORMAL HIGH (ref 70–99)

## 2022-03-10 NOTE — Progress Notes (Signed)
Daily Session Note  Patient Details  Name: Chase Keller MRN: [id number] Date of Birth: [birth year] Referring Provider:   Flowsheet Row Cardiac Rehab from 03/03/2022 in [facility] Cardiac and Pulmonary Rehab  Referring Provider Lendon Collar MD ([place])       Encounter Date: 03/10/2022  Check In:  Session Check In - 03/10/22 1037       Check-In   Supervising physician immediately available to respond to emergencies See telemetry face sheet for immediately available ER MD    Location [facility]-Cardiac & Pulmonary Rehab    Staff Present Earlean Shawl, BS, ACSM CEP, Exercise Physiologist;Adilee Lemme Tamala Julian, RN, ADN;Meredith Sherryll Burger, RN BSN;Noah Tickle, BS, Exercise Physiologist    Virtual Visit No    Medication changes reported     No    Fall or balance concerns reported    No    Warm-up and Cool-down Performed on first and last piece of equipment    Resistance Training Performed Yes    VAD Patient? No    PAD/SET Patient? No      Pain Assessment   Currently in Pain? No/denies                Social History   Tobacco Use  Smoking Status Never  Smokeless Tobacco Never  Tobacco Comments   father smoked in the home while pt was growing up//ex-wife smoked    Goals Met:  Independence with exercise equipment Exercise tolerated well No report of concerns or symptoms today Strength training completed today  Goals Unmet:  Not Applicable  Comments: First full day of exercise!  Patient was oriented to gym and equipment including functions, settings, policies, and procedures.  Patient's individual exercise prescription and treatment plan were reviewed.  All starting workloads were established based on the results of the 6 minute walk test done at initial orientation visit.  The plan for exercise progression was also introduced and progression will be customized based on patient's performance and goals.    Dr. Emily Filbert is Medical Director for [place].  Dr. Ottie Glazier is Medical Director for [facility] Pulmonary Rehabilitation.

## 2022-03-12 ENCOUNTER — Encounter: Payer: No Typology Code available for payment source | Admitting: *Deleted

## 2022-03-12 DIAGNOSIS — I213 ST elevation (STEMI) myocardial infarction of unspecified site: Secondary | ICD-10-CM

## 2022-03-12 DIAGNOSIS — Z955 Presence of coronary angioplasty implant and graft: Secondary | ICD-10-CM

## 2022-03-12 DIAGNOSIS — Z48812 Encounter for surgical aftercare following surgery on the circulatory system: Secondary | ICD-10-CM | POA: Diagnosis not present

## 2022-03-12 LAB — GLUCOSE, CAPILLARY
Glucose-Capillary: 110 mg/dL — ABNORMAL HIGH (ref 70–99)
Glucose-Capillary: 159 mg/dL — ABNORMAL HIGH (ref 70–99)

## 2022-03-12 NOTE — Progress Notes (Signed)
Daily Session Note  Patient Details  Name: OKLEY MAGNUSSEN MRN: [id number] Date of Birth: [birth year] Referring Provider:   Flowsheet Row Cardiac Rehab from 03/03/2022 in [facility] Cardiac and Pulmonary Rehab  Referring Provider Lendon Collar MD ([place])       Encounter Date: 03/12/2022  Check In:  Session Check In - 03/12/22 1221       Check-In   Supervising physician immediately available to respond to emergencies See telemetry face sheet for immediately available ER MD    Location [facility]-Cardiac & Pulmonary Rehab    Staff Present Nyoka Cowden, RN, BSN, Lauretta Grill, RCP,RRT,BSRT;Noah Tickle, BS, Exercise Physiologist;Laureen Owens Shark, BS, RRT, CPFT    Virtual Visit No    Medication changes reported     No    Tobacco Cessation No Change    Warm-up and Cool-down Performed on first and last piece of equipment    Resistance Training Performed Yes    VAD Patient? No    PAD/SET Patient? No      Pain Assessment   Currently in Pain? No/denies                Social History   Tobacco Use  Smoking Status Never  Smokeless Tobacco Never  Tobacco Comments   father smoked in the home while pt was growing up//ex-wife smoked    Goals Met:  Independence with exercise equipment Exercise tolerated well No report of concerns or symptoms today  Goals Unmet:  Not Applicable  Comments: Pt able to follow exercise prescription today without complaint.  Will continue to monitor for progression.    Dr. Emily Filbert is Medical Director for [place].  Dr. Ottie Glazier is Medical Director for [facility] Pulmonary Rehabilitation.

## 2022-03-14 ENCOUNTER — Encounter: Payer: No Typology Code available for payment source | Admitting: *Deleted

## 2022-03-14 DIAGNOSIS — Z48812 Encounter for surgical aftercare following surgery on the circulatory system: Secondary | ICD-10-CM | POA: Diagnosis not present

## 2022-03-14 DIAGNOSIS — I213 ST elevation (STEMI) myocardial infarction of unspecified site: Secondary | ICD-10-CM

## 2022-03-14 DIAGNOSIS — Z955 Presence of coronary angioplasty implant and graft: Secondary | ICD-10-CM

## 2022-03-14 LAB — GLUCOSE, CAPILLARY: Glucose-Capillary: 130 mg/dL — ABNORMAL HIGH (ref 70–99)

## 2022-03-14 NOTE — Progress Notes (Signed)
Daily Session Note  Patient Details  Name: OLON RUSS MRN: [id number] Date of Birth: 1941/02/05 Referring Provider:   Flowsheet Row Cardiac Rehab from 03/03/2022 in [facility] Cardiac and Pulmonary Rehab  Referring Provider Lendon Collar MD ([place])       Encounter Date: 03/14/2022  Check In:  Session Check In - 03/14/22 1011       Check-In   Supervising physician immediately available to respond to emergencies See telemetry face sheet for immediately available ER MD    Location [facility]-Cardiac & Pulmonary Rehab    Staff Present Heath Lark, RN, BSN, CCRP;Jessica [place], MA, RCEP, CCRP, CCET;Joseph [place], [place]    Virtual Visit No    Medication changes reported     No    Fall or balance concerns reported    No    Warm-up and Cool-down Performed on first and last piece of equipment    Resistance Training Performed Yes    VAD Patient? No    PAD/SET Patient? No      Pain Assessment   Currently in Pain? No/denies                Social History   Tobacco Use  Smoking Status Never  Smokeless Tobacco Never  Tobacco Comments   father smoked in the home while pt was growing up//ex-wife smoked    Goals Met:  Independence with exercise equipment Exercise tolerated well No report of concerns or symptoms today  Goals Unmet:  Not Applicable  Comments: Pt able to follow exercise prescription today without complaint.  Will continue to monitor for progression.    Dr. Emily Filbert is Medical Director for [place].  Dr. Ottie Glazier is Medical Director for [facility] Pulmonary Rehabilitation.

## 2022-03-17 ENCOUNTER — Encounter: Payer: No Typology Code available for payment source | Admitting: *Deleted

## 2022-03-17 DIAGNOSIS — Z48812 Encounter for surgical aftercare following surgery on the circulatory system: Secondary | ICD-10-CM | POA: Diagnosis not present

## 2022-03-17 DIAGNOSIS — Z955 Presence of coronary angioplasty implant and graft: Secondary | ICD-10-CM

## 2022-03-17 NOTE — Progress Notes (Signed)
Daily Session Note  Patient Details  Name: Chase Keller MRN: [id number] Date of Birth: 07/26/1940 Referring Provider:   Flowsheet Row Cardiac Rehab from 03/03/2022 in [facility] Cardiac and Pulmonary Rehab  Referring Provider Lendon Collar MD ([place])       Encounter Date: 03/17/2022  Check In:  Session Check In - 03/17/22 0938       Check-In   Supervising physician immediately available to respond to emergencies See telemetry face sheet for immediately available ER MD    Location [facility]-Cardiac & Pulmonary Rehab    Staff Present Darlyne Russian, RN, Doyce Para, BS, ACSM CEP, Exercise Physiologist;Noah Tickle, BS, Exercise Physiologist    Virtual Visit No    Medication changes reported     No    Fall or balance concerns reported    No    Warm-up and Cool-down Performed on first and last piece of equipment    Resistance Training Performed Yes    VAD Patient? No    PAD/SET Patient? No      Pain Assessment   Currently in Pain? No/denies                Social History   Tobacco Use  Smoking Status Never  Smokeless Tobacco Never  Tobacco Comments   father smoked in the home while pt was growing up//ex-wife smoked    Goals Met:  Independence with exercise equipment Exercise tolerated well No report of concerns or symptoms today Strength training completed today  Goals Unmet:  Not Applicable  Comments: Pt able to follow exercise prescription today without complaint.  Will continue to monitor for progression.    Dr. Emily Filbert is Medical Director for [place].  Dr. Ottie Glazier is Medical Director for [facility] Pulmonary Rehabilitation.

## 2022-03-19 ENCOUNTER — Encounter: Payer: No Typology Code available for payment source | Admitting: *Deleted

## 2022-03-19 DIAGNOSIS — Z955 Presence of coronary angioplasty implant and graft: Secondary | ICD-10-CM

## 2022-03-19 DIAGNOSIS — Z48812 Encounter for surgical aftercare following surgery on the circulatory system: Secondary | ICD-10-CM | POA: Diagnosis not present

## 2022-03-19 NOTE — Progress Notes (Signed)
Daily Session Note  Patient Details  Name: BOBBI KOZAKIEWICZ MRN: [id number] Date of Birth: 10-06-1940 Referring Provider:   Flowsheet Row Cardiac Rehab from 03/03/2022 in [facility] Cardiac and Pulmonary Rehab  Referring Provider Lendon Collar MD ([place])       Encounter Date: 03/19/2022  Check In:  Session Check In - 03/19/22 0929       Check-In   Supervising physician immediately available to respond to emergencies See telemetry face sheet for immediately available ER MD    Location [facility]-Cardiac & Pulmonary Rehab    Staff Present Darlyne Russian, RN, Dimple Nanas, BS, Exercise Physiologist;Joseph [place], RCP,RRT,BSRT;Melissa [place], [place], LDN    Virtual Visit No    Medication changes reported     No    Fall or balance concerns reported    No    Warm-up and Cool-down Performed on first and last piece of equipment    Resistance Training Performed Yes    VAD Patient? No    PAD/SET Patient? No      Pain Assessment   Currently in Pain? No/denies                Social History   Tobacco Use  Smoking Status Never  Smokeless Tobacco Never  Tobacco Comments   father smoked in the home while pt was growing up//ex-wife smoked    Goals Met:  Independence with exercise equipment Exercise tolerated well No report of concerns or symptoms today Strength training completed today  Goals Unmet:  Not Applicable  Comments: Pt able to follow exercise prescription today without complaint.  Will continue to monitor for progression.    Dr. Emily Filbert is Medical Director for [place].  Dr. Ottie Glazier is Medical Director for [facility] Pulmonary Rehabilitation.

## 2022-03-21 ENCOUNTER — Encounter: Payer: No Typology Code available for payment source | Admitting: *Deleted

## 2022-03-21 DIAGNOSIS — Z955 Presence of coronary angioplasty implant and graft: Secondary | ICD-10-CM

## 2022-03-21 DIAGNOSIS — Z48812 Encounter for surgical aftercare following surgery on the circulatory system: Secondary | ICD-10-CM | POA: Diagnosis not present

## 2022-03-21 NOTE — Progress Notes (Signed)
Daily Session Note  Patient Details  Name: Chase Keller MRN: [id number] Date of Birth: 03/16/1941 Referring Provider:   Flowsheet Row Cardiac Rehab from 03/03/2022 in [facility] Cardiac and Pulmonary Rehab  Referring Provider Lendon Collar MD ([place])       Encounter Date: 03/21/2022  Check In:  Session Check In - 03/21/22 1015       Check-In   Supervising physician immediately available to respond to emergencies See telemetry face sheet for immediately available ER MD    Location [facility]-Cardiac & Pulmonary Rehab    Staff Present Heath Lark, RN, BSN, CCRP;Jessica [place], MA, RCEP, CCRP, CCET;Joseph [place], [place]    Virtual Visit No    Medication changes reported     No    Fall or balance concerns reported    No    Warm-up and Cool-down Performed on first and last piece of equipment    Resistance Training Performed Yes    VAD Patient? No    PAD/SET Patient? No      Pain Assessment   Currently in Pain? No/denies                Social History   Tobacco Use  Smoking Status Never  Smokeless Tobacco Never  Tobacco Comments   father smoked in the home while pt was growing up//ex-wife smoked    Goals Met:  Independence with exercise equipment Exercise tolerated well No report of concerns or symptoms today  Goals Unmet:  Not Applicable  Comments: Pt able to follow exercise prescription today without complaint.  Will continue to monitor for progression.    Dr. Emily Filbert is Medical Director for [place].  Dr. Ottie Glazier is Medical Director for [facility] Pulmonary Rehabilitation.

## 2022-03-24 ENCOUNTER — Encounter: Payer: No Typology Code available for payment source | Admitting: *Deleted

## 2022-03-24 DIAGNOSIS — Z48812 Encounter for surgical aftercare following surgery on the circulatory system: Secondary | ICD-10-CM | POA: Diagnosis not present

## 2022-03-24 DIAGNOSIS — Z955 Presence of coronary angioplasty implant and graft: Secondary | ICD-10-CM

## 2022-03-24 NOTE — Progress Notes (Signed)
Daily Session Note  Patient Details  Name: QUINTUS PREMO MRN: [id number] Date of Birth: 08-20-1940 Referring Provider:   Flowsheet Row Cardiac Rehab from 03/03/2022 in [facility] Cardiac and Pulmonary Rehab  Referring Provider Lendon Collar MD ([place])       Encounter Date: 03/24/2022  Check In:  Session Check In - 03/24/22 0929       Check-In   Supervising physician immediately available to respond to emergencies See telemetry face sheet for immediately available ER MD    Location [facility]-Cardiac & Pulmonary Rehab    Staff Present Darlyne Russian, RN, Doyce Para, BS, ACSM CEP, Exercise Physiologist;Joseph Tessie Fass, [place]    Virtual Visit No    Medication changes reported     No    Fall or balance concerns reported    No    Warm-up and Cool-down Performed on first and last piece of equipment    Resistance Training Performed Yes    VAD Patient? No    PAD/SET Patient? No      Pain Assessment   Currently in Pain? No/denies                Social History   Tobacco Use  Smoking Status Never  Smokeless Tobacco Never  Tobacco Comments   father smoked in the home while pt was growing up//ex-wife smoked    Goals Met:  Independence with exercise equipment Exercise tolerated well Personal goals reviewed No report of concerns or symptoms today Strength training completed today  Goals Unmet:  Not Applicable  Comments: Pt able to follow exercise prescription today without complaint.  Will continue to monitor for progression.    Dr. Emily Filbert is Medical Director for [place].  Dr. Ottie Glazier is Medical Director for [facility] Pulmonary Rehabilitation.

## 2022-03-28 ENCOUNTER — Encounter: Payer: No Typology Code available for payment source | Admitting: *Deleted

## 2022-03-28 DIAGNOSIS — Z48812 Encounter for surgical aftercare following surgery on the circulatory system: Secondary | ICD-10-CM | POA: Diagnosis not present

## 2022-03-28 DIAGNOSIS — I213 ST elevation (STEMI) myocardial infarction of unspecified site: Secondary | ICD-10-CM

## 2022-03-28 DIAGNOSIS — Z955 Presence of coronary angioplasty implant and graft: Secondary | ICD-10-CM

## 2022-03-28 NOTE — Progress Notes (Signed)
Daily Session Note  Patient Details  Name: BARRET ESQUIVEL MRN: [id number] Date of Birth: 1940-12-12 Referring Provider:   Flowsheet Row Cardiac Rehab from 03/03/2022 in [facility] Cardiac and Pulmonary Rehab  Referring Provider Lendon Collar MD ([place])       Encounter Date: 03/28/2022  Check In:  Session Check In - 03/28/22 1002       Check-In   Supervising physician immediately available to respond to emergencies See telemetry face sheet for immediately available ER MD    Location [facility]-Cardiac & Pulmonary Rehab    Staff Present Heath Lark, RN, BSN, CCRP;Jessica [place], MA, RCEP, CCRP, CCET;Joseph [place], [place]    Virtual Visit No    Medication changes reported     No    Fall or balance concerns reported    No    Warm-up and Cool-down Performed on first and last piece of equipment    Resistance Training Performed Yes    VAD Patient? No    PAD/SET Patient? No      Pain Assessment   Currently in Pain? No/denies                Social History   Tobacco Use  Smoking Status Never  Smokeless Tobacco Never  Tobacco Comments   father smoked in the home while pt was growing up//ex-wife smoked    Goals Met:  Independence with exercise equipment Exercise tolerated well No report of concerns or symptoms today  Goals Unmet:  Not Applicable  Comments: Pt able to follow exercise prescription today without complaint.  Will continue to monitor for progression.    Dr. Emily Filbert is Medical Director for [place].  Dr. Ottie Glazier is Medical Director for [facility] Pulmonary Rehabilitation.

## 2022-04-02 ENCOUNTER — Encounter: Payer: Self-pay | Admitting: *Deleted

## 2022-04-02 ENCOUNTER — Encounter: Payer: No Typology Code available for payment source | Admitting: *Deleted

## 2022-04-02 DIAGNOSIS — Z955 Presence of coronary angioplasty implant and graft: Secondary | ICD-10-CM

## 2022-04-02 DIAGNOSIS — Z48812 Encounter for surgical aftercare following surgery on the circulatory system: Secondary | ICD-10-CM | POA: Diagnosis not present

## 2022-04-02 NOTE — Progress Notes (Signed)
Cardiac Individual Treatment Plan  Patient Details  Name: LANDAN FEDIE MRN: [id number] Date of Birth: [birth year] Referring Provider:   Flowsheet Row Cardiac Rehab from 03/03/2022 in [facility] Cardiac and Pulmonary Rehab  Referring Provider Lendon Collar MD ([place])       Initial Encounter Date:  Flowsheet Row Cardiac Rehab from 03/03/2022 in [facility] Cardiac and Pulmonary Rehab  Date 03/03/22       Visit Diagnosis: Status post coronary artery stent placement  Patient's Home Medications on Admission:  Current Outpatient Medications:    ACAI BERRY PO, Take 3,000 mg by mouth daily., Disp: , Rfl:    albuterol (VENTOLIN HFA) 108 (90 Base) MCG/ACT inhaler, Inhale 2 puffs into the lungs every 6 (six) hours as needed for wheezing or shortness of breath., Disp: , Rfl:    ALFALFA PO, Take 1,300 mg by mouth 2 (two) times daily., Disp: , Rfl:    allopurinol (ZYLOPRIM) 300 MG tablet, Take 300 mg by mouth daily., Disp: , Rfl:    amLODipine (NORVASC) 2.5 MG tablet, Take 2.5 mg by mouth daily as needed (If systolic BP over 226.)., Disp: , Rfl:    ammonium lactate (LAC-HYDRIN) 12 % lotion, Apply 1 application topically daily as needed for dry skin., Disp: , Rfl:    ammonium lactate (LAC-HYDRIN) 12 % lotion, APPLY SMALL AMOUNT TOPICALLY IN THE MORNING FOR DRY SKIN (Patient not taking: Reported on 02/19/2022), Disp: , Rfl:    apixaban (ELIQUIS) 5 MG TABS tablet, TAKE ONE TABLET BY MOUTH EVERY 12 HOURS CAUTION BLOOD THINNER THIS REPLACES YOUR WARFARIN, Disp: , Rfl:    APPLE CIDER VINEGAR PO, Take 480 mg by mouth daily., Disp: , Rfl:    Ascorbic Acid (VITAMIN C) 1000 MG tablet, Take 1,000 mg by mouth daily., Disp: , Rfl:    aspirin EC 81 MG tablet, Take 81 mg by mouth daily., Disp: , Rfl:    benzonatate (TESSALON) 100 MG capsule, Take 1 capsule by mouth 3 (three) times daily as needed., Disp: , Rfl:    Calcium Carb-Cholecalciferol (CALCIUM/VITAMIN D) 600-400 MG-UNIT TABS, Take 1 tablet by mouth 2 (two)  times daily. (Patient not taking: Reported on 02/19/2022), Disp: , Rfl:    Calcium Carbonate-Vit D-Min (CALCIUM 600+D PLUS MINERALS) 600-400 MG-UNIT TABS, Take 1 tablet by mouth 2 (two) times daily., Disp: , Rfl:    carvedilol (COREG) 6.25 MG tablet, Take by mouth 2 (two) times daily with a meal., Disp: , Rfl:    Cholecalciferol (VITAMIN D3) 250 MCG (10000 UT) capsule, Take 10,000 Units by mouth daily., Disp: , Rfl:    Cholecalciferol 125 MCG (5000 UT) TABS, Take by mouth., Disp: , Rfl:    Cinnamon 500 MG capsule, Take 500 mg by mouth daily., Disp: , Rfl:    Coenzyme Q10 200 MG capsule, Take 200 mg by mouth daily., Disp: , Rfl:    cyanocobalamin 100 MCG tablet, TAKE ONE TABLET BY MOUTH EVERY DAY TO PREVENT VITAMIN B12 DEFICIENCY (Patient not taking: Reported on 02/19/2022), Disp: , Rfl:    cyanocobalamin 1000 MCG tablet, Take 1,000 mcg by mouth daily., Disp: , Rfl:    ezetimibe (ZETIA) 10 MG tablet, Take 10 mg by mouth daily., Disp: , Rfl:    ferrous sulfate 325 (65 FE) MG tablet, Take 325 mg by mouth daily with breakfast., Disp: , Rfl:    fluticasone (FLONASE) 50 MCG/ACT nasal spray, Place 2 sprays into both nostrils daily as needed for rhinitis. (Patient not taking: Reported on 02/19/2022), Disp: , Rfl:  fluticasone (FLONASE) 50 MCG/ACT nasal spray, Place into the nose., Disp: , Rfl:    Glucosamine-Chondroit-Vit C-Mn (GLUCOSAMINE CHONDR 500 COMPLEX) CAPS, Take 2 capsules by mouth 2 (two) times daily., Disp: , Rfl:    lidocaine (LIDODERM) 5 %, Place onto the skin., Disp: , Rfl:    MAGNESIUM PO, Take by mouth., Disp: , Rfl:    niacin 500 MG CR capsule, Take 500 mg by mouth at bedtime. B3, Disp: , Rfl:    Potassium 99 MG TABS, Take 99 mg by mouth 2 (two) times daily., Disp: , Rfl:    potassium gluconate 595 (99 K) MG TABS tablet, Take by mouth., Disp: , Rfl:    pyridOXINE (B-6) 50 MG tablet, Take 100 mg by mouth daily., Disp: , Rfl:    pyridOXINE (B-6) 50 MG tablet, Take 2 tablets by mouth  daily. (Patient not taking: Reported on 02/19/2022), Disp: , Rfl:    RED YEAST RICE EXTRACT PO, Take 600 mg by mouth 2 (two) times daily., Disp: , Rfl:    riboflavin (VITAMIN B-2) 100 MG TABS tablet, Take 100 mg by mouth daily., Disp: , Rfl:    senna-docusate (SENOKOT-S) 8.6-50 MG tablet, Take 1 tablet by mouth 2 (two) times daily., Disp: , Rfl:    Skin Protectants, Misc. (HYDROCERIN EX), Apply 1 application topically daily as needed (dry skin)., Disp: , Rfl:    Skin Protectants, Misc. (MINERIN CREME EX), Apply topically., Disp: , Rfl:    spironolactone (ALDACTONE) 25 MG tablet, Take 12.5 mg by mouth daily. (Patient not taking: Reported on 02/19/2022), Disp: , Rfl:    spironolactone (ALDACTONE) 25 MG tablet, Take 0.5 tablets by mouth daily., Disp: , Rfl:    tamsulosin (FLOMAX) 0.4 MG CAPS capsule, Take 0.4 mg by mouth at bedtime. (Patient not taking: Reported on 02/19/2022), Disp: , Rfl:    tamsulosin (FLOMAX) 0.4 MG CAPS capsule, Take 1 capsule by mouth at bedtime., Disp: , Rfl:    thiamine 100 MG tablet, Take 100 mg by mouth daily., Disp: , Rfl:    traMADol (ULTRAM) 50 MG tablet, Take 50 mg by mouth every 6 (six) hours as needed for moderate pain., Disp: , Rfl:    trospium (SANCTURA) 20 MG tablet, Take 20 mg by mouth 2 (two) times daily., Disp: , Rfl:    warfarin (COUMADIN) 1 MG tablet, Take 3 mg by mouth at bedtime., Disp: , Rfl:    zinc gluconate 50 MG tablet, Take 50 mg by mouth daily., Disp: , Rfl:   Past Medical History: Past Medical History:  Diagnosis Date   Agent orange exposure    Atrial fibrillation ([place]) 2013   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   CHF (congestive heart failure) (HCC)    COPD (chronic obstructive pulmonary disease) (HCC)    Coronary artery disease    Diabetes mellitus without complication (HCC)    diet controlled   Dysrhythmia     A fib   Heart murmur    / 1 MD said yes, 1 said no   History of blood transfusion    during CABG   History of kidney stones     Hyperlipidemia    Hypertension    Left knee DJD    MRSA (methicillin resistant staph aureus) culture positive    Drainage from Left Knee approx 2005   Myocardial infarction ([place])    mild in 1998 per Wife    PONV (postoperative nausea and vomiting)    S/P coronary artery bypass graft x  2    1998   Shortness of breath    Sleep apnea    CPAP   Stented coronary artery     Tobacco Use: Social History   Tobacco Use  Smoking Status Never  Smokeless Tobacco Never  Tobacco Comments   father smoked in the home while pt was growing up//ex-wife smoked    Labs: Review Flowsheet       Latest Ref Rng & Units 03/28/2011 01/14/2012 02/25/2012 06/04/2020  Labs for ITP Cardiac and Pulmonary Rehab  Cholestrol 0 - 200 mg/dL 179  158  - -  LDL (calc) 0 - 99 mg/dL 124  110  - -  HDL-C >39.00 mg/dL 26.00  24.00  - -  Trlycerides 0.0 - 149.0 mg/dL 146.0  122.0  - -  Hemoglobin A1c 4.8 - 5.6 % - - 6.3  6.1      Exercise Target Goals: Exercise Program Goal: Individual exercise prescription set using results from initial 6 min walk test and THRR while considering  patient's activity barriers and safety.   Exercise Prescription Goal: Initial exercise prescription builds to 30-45 minutes a day of aerobic activity, 2-3 days per week.  Home exercise guidelines will be given to patient during program as part of exercise prescription that the participant will acknowledge.   Education: Aerobic Exercise: - Group verbal and visual presentation on the components of exercise prescription. Introduces F.I.T.T principle from ACSM for exercise prescriptions.  Reviews F.I.T.T. principles of aerobic exercise including progression. Written material given at graduation. Flowsheet Row Cardiac Rehab from 03/12/2022 in [facility] Cardiac and Pulmonary Rehab  Education need identified 03/03/22       Education: Resistance Exercise: - Group verbal and visual presentation on the components of exercise prescription.  Introduces F.I.T.T principle from ACSM for exercise prescriptions  Reviews F.I.T.T. principles of resistance exercise including progression. Written material given at graduation.    Education: Exercise & Equipment Safety: - Individual verbal instruction and demonstration of equipment use and safety with use of the equipment. Flowsheet Row Cardiac Rehab from 03/12/2022 in [facility] Cardiac and Pulmonary Rehab  Date 02/19/22  Educator [facility]  Instruction Review Code 1- Verbalizes Understanding       Education: Exercise Physiology & General Exercise Guidelines: - Group verbal and written instruction with models to review the exercise physiology of the cardiovascular system and associated critical values. Provides general exercise guidelines with specific guidelines to those with heart or lung disease.    Education: Flexibility, Balance, Mind/Body Relaxation: - Group verbal and visual presentation with interactive activity on the components of exercise prescription. Introduces F.I.T.T principle from ACSM for exercise prescriptions. Reviews F.I.T.T. principles of flexibility and balance exercise training including progression. Also discusses the mind body connection.  Reviews various relaxation techniques to help reduce and manage stress (i.e. Deep breathing, progressive muscle relaxation, and visualization). Balance handout provided to take home. Written material given at graduation.   Activity Barriers & Risk Stratification:  Activity Barriers & Cardiac Risk Stratification - 03/03/22 1507       Activity Barriers & Cardiac Risk Stratification   Activity Barriers Left Knee Replacement;Arthritis;Deconditioning;Muscular Weakness;Shortness of Breath   arthritis in feet   Cardiac Risk Stratification Moderate             6 Minute Walk:  6 Minute Walk     Row Name 03/03/22 1507         6 Minute Walk   Phase Initial     Distance 1040 feet  Walk Time 6 minutes     # of Rest Breaks 0      MPH 197     METS 1.12     RPE 11     Perceived Dyspnea  2     VO2 Peak 5.31     Symptoms Yes (comment)     Comments SOB     Resting HR 61 bpm     Resting BP 128/64     Resting Oxygen Saturation  96 %     Exercise Oxygen Saturation  during 6 min walk 97 %     Max Ex. HR 105 bpm     Max Ex. BP 136/74     2 Minute Post BP 122/60              Oxygen Initial Assessment:   Oxygen Re-Evaluation:   Oxygen Discharge (Final Oxygen Re-Evaluation):   Initial Exercise Prescription:  Initial Exercise Prescription - 03/03/22 1500       Date of Initial Exercise RX and Referring Provider   Date 03/03/22    Referring Provider R[place], Vicente Males MD ([place])      Oxygen   Maintain Oxygen Saturation 88% or higher      Recumbant Bike   Level 1    RPM 50    Watts 5    Minutes 15    METs 2      NuStep   Level 1    SPM 80    Minutes 15    METs 2      T5 Nustep   Level 1    SPM 80    Minutes 15    METs 2      Biostep-RELP   Level 1    SPM 50    Minutes 15    METs 2      Track   Laps 27    Minutes 15    METs 2.47      Prescription Details   Frequency (times per week) 3    Duration Progress to 30 minutes of continuous aerobic without signs/symptoms of physical distress      Intensity   THRR 40-80% of Max Heartrate 92-123    Ratings of Perceived Exertion 11-13    Perceived Dyspnea 0-4      Progression   Progression Continue to progress workloads to maintain intensity without signs/symptoms of physical distress.      Resistance Training   Training Prescription Yes    Weight 5 lb    Reps 10-15             Perform Capillary Blood Glucose checks as needed.  Exercise Prescription Changes:   Exercise Prescription Changes     Row Name 03/03/22 1500 03/18/22 1500           Response to Exercise   Blood Pressure (Admit) 128/64 128/72      Blood Pressure (Exercise) 136/74 150/88      Blood Pressure (Exit) 122/60 122/62      Heart Rate (Admit) 61 bpm 67  bpm      Heart Rate (Exercise) 105 bpm 103 bpm      Heart Rate (Exit) 75 bpm 66 bpm      Oxygen Saturation (Admit) 96 % --      Oxygen Saturation (Exercise) 97 % --      Rating of Perceived Exertion (Exercise) 11 13      Perceived Dyspnea (Exercise) 2 --      Symptoms SOB  none      Comments walk test results 3rd full day of exercise      Duration -- Progress to 30 minutes of  aerobic without signs/symptoms of physical distress      Intensity -- THRR unchanged        Progression   Progression -- Continue to progress workloads to maintain intensity without signs/symptoms of physical distress.      Average METs -- 2.25        Resistance Training   Training Prescription -- Yes      Weight -- 5 lb      Reps -- 10-15        Interval Training   Interval Training -- No        Recumbant Bike   Level -- 3      Minutes -- 15      METs -- 2        NuStep   Level -- 2      Minutes -- 30      METs -- 2        T5 Nustep   Level -- 2      Minutes -- 15        Biostep-RELP   Level -- 3      Minutes -- 15      METs -- 3        Oxygen   Maintain Oxygen Saturation -- 88% or higher               Exercise Comments:   Exercise Comments     Row Name 03/10/22 1038           Exercise Comments First full day of exercise!  Patient was oriented to gym and equipment including functions, settings, policies, and procedures.  Patient's individual exercise prescription and treatment plan were reviewed.  All starting workloads were established based on the results of the 6 minute walk test done at initial orientation visit.  The plan for exercise progression was also introduced and progression will be customized based on patient's performance and goals.                Exercise Goals and Review:   Exercise Goals     Row Name 03/03/22 1512             Exercise Goals   Increase Physical Activity Yes       Intervention Provide advice, education, support and counseling about  physical activity/exercise needs.;Develop an individualized exercise prescription for aerobic and resistive training based on initial evaluation findings, risk stratification, comorbidities and participant's personal goals.       Expected Outcomes Short Term: Attend rehab on a regular basis to increase amount of physical activity.;Long Term: Add in home exercise to make exercise part of routine and to increase amount of physical activity.;Long Term: Exercising regularly at least 3-5 days a week.       Increase Strength and Stamina Yes       Intervention Provide advice, education, support and counseling about physical activity/exercise needs.;Develop an individualized exercise prescription for aerobic and resistive training based on initial evaluation findings, risk stratification, comorbidities and participant's personal goals.       Expected Outcomes Short Term: Increase workloads from initial exercise prescription for resistance, speed, and METs.;Short Term: Perform resistance training exercises routinely during rehab and add in resistance training at home;Long Term: Improve cardiorespiratory fitness, muscular endurance and strength as measured by increased METs and functional capacity (  6MWT)       Able to understand and use rate of perceived exertion (RPE) scale Yes       Intervention Provide education and explanation on how to use RPE scale       Expected Outcomes Short Term: Able to use RPE daily in rehab to express subjective intensity level;Long Term:  Able to use RPE to guide intensity level when exercising independently       Able to understand and use Dyspnea scale Yes       Intervention Provide education and explanation on how to use Dyspnea scale       Expected Outcomes Short Term: Able to use Dyspnea scale daily in rehab to express subjective sense of shortness of breath during exertion;Long Term: Able to use Dyspnea scale to guide intensity level when exercising independently       Knowledge  and understanding of Target Heart Rate Range (THRR) Yes       Intervention Provide education and explanation of THRR including how the numbers were predicted and where they are located for reference       Expected Outcomes Short Term: Able to state/look up THRR;Short Term: Able to use daily as guideline for intensity in rehab;Long Term: Able to use THRR to govern intensity when exercising independently       Able to check pulse independently Yes       Intervention Provide education and demonstration on how to check pulse in carotid and radial arteries.;Review the importance of being able to check your own pulse for safety during independent exercise       Expected Outcomes Short Term: Able to explain why pulse checking is important during independent exercise;Long Term: Able to check pulse independently and accurately       Understanding of Exercise Prescription Yes       Intervention Provide education, explanation, and written materials on patient's individual exercise prescription       Expected Outcomes Short Term: Able to explain program exercise prescription;Long Term: Able to explain home exercise prescription to exercise independently                Exercise Goals Re-Evaluation :  Exercise Goals Re-Evaluation     Row Name 03/10/22 1038 03/18/22 1529 03/24/22 0926         Exercise Goal Re-Evaluation   Exercise Goals Review Able to understand and use rate of perceived exertion (RPE) scale;Able to understand and use Dyspnea scale;Knowledge and understanding of Target Heart Rate Range (THRR);Understanding of Exercise Prescription Increase Physical Activity;Increase Strength and Stamina;Understanding of Exercise Prescription Increase Physical Activity;Increase Strength and Stamina;Understanding of Exercise Prescription     Comments Reviewed RPE scale, THR and program prescription with pt today.  Pt voiced understanding and was given a copy of goals to take home. Paola is off to a good start  with rehab for the first couple of sessions he has been here. He has already increased on all of his seated machines between level 2-3. Patient was not willing to try walking on the track yet, but will keep encouraging patient until he feels ready. Will continue to monitor as he progresses in the program. Romeo is tolerating his exercise well. He has been coming consistently to cardiac rehab. He has made some increases in his workloads since starting the program. He does have a bad foot and is working with the [facility] to have that checked before he starts walking in the program.     Expected Outcomes Short:  Use RPE daily to regulate intensity. Long: Follow program prescription in THR. Short: Incorporate couple laps of walking on track when ready Long: Continue to increase overall MET level Short: Incorporate couple laps of walking on track when ready Long: Continue to increase overall MET level              Discharge Exercise Prescription (Final Exercise Prescription Changes):  Exercise Prescription Changes - 03/18/22 1500       Response to Exercise   Blood Pressure (Admit) 128/72    Blood Pressure (Exercise) 150/88    Blood Pressure (Exit) 122/62    Heart Rate (Admit) 67 bpm    Heart Rate (Exercise) 103 bpm    Heart Rate (Exit) 66 bpm    Rating of Perceived Exertion (Exercise) 13    Symptoms none    Comments 3rd full day of exercise    Duration Progress to 30 minutes of  aerobic without signs/symptoms of physical distress    Intensity THRR unchanged      Progression   Progression Continue to progress workloads to maintain intensity without signs/symptoms of physical distress.    Average METs 2.25      Resistance Training   Training Prescription Yes    Weight 5 lb    Reps 10-15      Interval Training   Interval Training No      Recumbant Bike   Level 3    Minutes 15    METs 2      NuStep   Level 2    Minutes 30    METs 2      T5 Nustep   Level 2    Minutes 15       Biostep-RELP   Level 3    Minutes 15    METs 3      Oxygen   Maintain Oxygen Saturation 88% or higher             Nutrition:  Target Goals: Understanding of nutrition guidelines, daily intake of sodium <1567m, cholesterol <201m calories 30% from fat and 7% or less from saturated fats, daily to have 5 or more servings of fruits and vegetables.  Education: All About Nutrition: -Group instruction provided by verbal, written material, interactive activities, discussions, models, and posters to present general guidelines for heart healthy nutrition including fat, fiber, MyPlate, the role of sodium in heart healthy nutrition, utilization of the nutrition label, and utilization of this knowledge for meal planning. Follow up email sent as well. Written material given at graduation. Flow[facility]  Education need identified 03/03/22       Biometrics:  Pre Biometrics - 03/03/22 1513       Pre Biometrics   Height 5' 5.9" (1.674 m)    Weight 213 lb 4.8 oz (96.8 kg)    Waist Circumference 44.75 inches    Hip Circumference 41.5 inches    Waist to Hip Ratio 1.08 %    BMI (Calculated) 34.53    Single Leg Stand 4.9 seconds              Nutrition Therapy Plan and Nutrition Goals:  Nutrition Therapy & Goals - 03/03/22 1042       Nutrition Therapy   Diet Heart healthy, low Na, T2DM    Protein (specify units) 85-95g    Fiber 28 grams    Whole Grain Foods 3 servings    Saturated Fats 16 max. grams  Fruits and Vegetables 8 servings/day    Sodium 2 grams      Personal Nutrition Goals   Nutrition Goal ST: LT: limit sodium <2g/day, limit saturated fat <16g, follow MyPlate guidelines    Comments 81 y.o. M admitted to cardiac rehab s/p stent placement.PMHx includes HTN, CAD, CHF, a.fib, OSA, T2DM, hypothyroidism. Relevant medications includes apple cider vinegar pill, calcium, vitamin D3, CoQ10, B-12, ferrous  sulfate, vitamin C, cinnamon, magnesium, potassium, red yeast extract, B-6, B-2, thiamine, tramadol, zinc, senokot, acai berry PO, alfalfa PO, MSM. Pt reports taking many supplements for a long time; discussed how many can be unnecessary, it is important to get our nutrients from food first when we can, supplements should be third party tested, you can exceed recommended doeses especially with fat soluble vitamins such as vitamin D, and to check with your health care provider before starting any supplements as they can also interact with medications and medical conditions, pt voiced understanding. B: scrambled eggs or grits or oatmeal L: vegetable plate at restaurant (macoroni salad, green beans, peaches). He does not have raw red tomato as that bothers his gums. He limits bread and tries to not eat it. D: [employer] chicken on tuesday, vegetables when he goes out to eat. He also limits his dessert. He reports cooking about 2x/week. [place] reports he would like to lose weight around his midsection - discussed how we cannot spot-reduce fat, aging can cause natural body composition changes, and some behavior changes that can help such as including fiber, reducing excess fat and added sugar, and honoring hunger. Reviewed heart healthy nutrition and diabetes guidelines. Discussed how going out to eat can make it hard to include whole grains, include variety, limit saturated fat, and limit sodium. Discussed some tips on how to eat more heart healthy options when out to eat such as including more non-starchy vegetables, choosing more lean proteins such as chicken, limiting red meat, limiting creamy sauces/dressings and limiting fried or breaded foods.      Intervention Plan   Intervention Prescribe, educate and counsel regarding individualized specific dietary modifications aiming towards targeted core components such as weight, hypertension, lipid management, diabetes, heart failure and other comorbidities.;Nutrition  handout(s) given to patient.    Expected Outcomes Short Term Goal: Understand basic principles of dietary content, such as calories, fat, sodium, cholesterol and nutrients.;Short Term Goal: A plan has been developed with personal nutrition goals set during dietitian appointment.;Long Term Goal: Adherence to prescribed nutrition plan.             Nutrition Assessments:  MEDIFICTS Score Key: ?70 Need to make dietary changes  40-70 Heart Healthy Diet ? 40 Therapeutic Level Cholesterol Diet  Flowsheet Row Cardiac Rehab from 03/03/2022 in [facility] Cardiac and Pulmonary Rehab  Picture Your Plate Total Score on Admission 69      Picture Your Plate Scores: <54 Unhealthy dietary pattern with much room for improvement. 41-50 Dietary pattern unlikely to meet recommendations for good health and room for improvement. 51-60 More healthful dietary pattern, with some room for improvement.  >60 Healthy dietary pattern, although there may be some specific behaviors that could be improved.    Nutrition Goals Re-Evaluation:  Nutrition Goals Re-Evaluation     [place] Name 03/24/22 0931             Goals   Nutrition Goal ST: LT: limit sodium <2g/day, limit saturated fat <16g, follow MyPlate guidelines       Comment Patient reports that he has been  trying to cut back on sodium an fat. He now eats his seafood broiled instead of fried. He has also been removing skin from chicken.       Expected Outcome Short: continue to to limit sodium and fat. Long: continue to make heart healthy dietary choices to help control risk factors.                Nutrition Goals Discharge (Final Nutrition Goals Re-Evaluation):  Nutrition Goals Re-Evaluation - 03/24/22 0931       Goals   Nutrition Goal ST: LT: limit sodium <2g/day, limit saturated fat <16g, follow MyPlate guidelines    Comment Patient reports that he has been trying to cut back on sodium an fat. He now eats his seafood broiled instead of fried. He has  also been removing skin from chicken.    Expected Outcome Short: continue to to limit sodium and fat. Long: continue to make heart healthy dietary choices to help control risk factors.             Psychosocial: Target Goals: Acknowledge presence or absence of significant depression and/or stress, maximize coping skills, provide positive support system. Participant is able to verbalize types and ability to use techniques and skills needed for reducing stress and depression.   Education: Stress, Anxiety, and Depression - Group verbal and visual presentation to define topics covered.  Reviews how body is impacted by stress, anxiety, and depression.  Also discusses healthy ways to reduce stress and to treat/manage anxiety and depression.  Written material given at graduation. Flowsheet Row Cardiac Rehab from 11/30/2019 in [facility] Cardiac and Pulmonary Rehab  Date 11/09/19  Educator SB  Instruction Review Code 1- [employer] Understanding       Education: Sleep Hygiene -Provides group verbal and written instruction about how sleep can affect your health.  Define sleep hygiene, discuss sleep cycles and impact of sleep habits. Review good sleep hygiene tips.    Initial Review & Psychosocial Screening:  Initial Psych Review & Screening - 02/19/22 1437       Initial Review   Current issues with None Identified      Family Dynamics   Good Support System? Yes    Comments His wife is his support system. He has done the program before and is ready to get healthier.      Barriers   Psychosocial barriers to participate in program There are no identifiable barriers or psychosocial needs.;The patient should benefit from training in stress management and relaxation.      Screening Interventions   Interventions Encouraged to exercise;To provide support and resources with identified psychosocial needs;Provide feedback about the scores to participant    Expected Outcomes Short Term goal: Utilizing  psychosocial counselor, staff and physician to assist with identification of specific Stressors or current issues interfering with healing process. Setting desired goal for each stressor or current issue identified.;Short Term goal: Identification and review with participant of any Quality of Life or Depression concerns found by scoring the questionnaire.;Long Term Goal: Stressors or current issues are controlled or eliminated.;Long Term goal: The participant improves quality of Life and PHQ9 Scores as seen by post scores and/or verbalization of changes             Quality of Life Scores:   Quality of Life - 03/03/22 1513       Quality of Life   Select Quality of Life      Quality of Life Scores   Health/Function Pre 17.5 %  Socioeconomic Pre 19.71 %    Psych/Spiritual Pre 22.79 %    Family Pre 18 %    GLOBAL Pre 19.12 %            Scores of 19 and below usually indicate a poorer quality of life in these areas.  A difference of  2-3 points is a clinically meaningful difference.  A difference of 2-3 points in the total score of the Quality of Life Index has been associated with significant improvement in overall quality of life, self-image, physical symptoms, and general health in studies assessing change in quality of life.  PHQ-9: Review Flowsheet       03/03/2022 10/27/2019 02/26/2012  Depression screen PHQ 2/9  Decreased Interest 0 0 0  Down, Depressed, Hopeless 0 0 0  PHQ - 2 Score 0 0 0  Altered sleeping 0 0 -  Tired, decreased energy 0 0 -  Change in appetite 0 0 -  Feeling bad or failure about yourself  0 0 -  Trouble concentrating 0 0 -  Moving slowly or fidgety/restless 0 0 -  Suicidal thoughts 0 0 -  PHQ-9 Score 0 0 -  Difficult doing work/chores Not difficult at all Not difficult at all -   Interpretation of Total Score  Total Score Depression Severity:  1-4 = Minimal depression, 5-9 = Mild depression, 10-14 = Moderate depression, 15-19 = Moderately  severe depression, 20-27 = Severe depression   Psychosocial Evaluation and Intervention:  Psychosocial Evaluation - 02/19/22 1438       Psychosocial Evaluation & Interventions   Interventions Stress management education;Relaxation education;Encouraged to exercise with the program and follow exercise prescription    Comments His wife is his support system. He has done the program before and is ready to get healthier.    Expected Outcomes Short: attend cardiac rehab for exercise and education. Long: develop positive self care habits.    Continue Psychosocial Services  Follow up required by staff             Psychosocial Re-Evaluation:  Psychosocial Re-Evaluation     [place] Name 03/24/22 [phone]             Psychosocial Re-Evaluation   Current issues with None Identified       Comments Patient reports no new mental health or sleep concerns.       Expected Outcomes Short: continue to attend cardiac rehab and exercise consistently to benefit mental health. Long: mantain good mental health habits.       Interventions Encouraged to attend Cardiac Rehabilitation for the exercise       Continue Psychosocial Services  Follow up required by staff                Psychosocial Discharge (Final Psychosocial Re-Evaluation):  Psychosocial Re-Evaluation - 03/24/22 3329       Psychosocial Re-Evaluation   Current issues with None Identified    Comments Patient reports no new mental health or sleep concerns.    Expected Outcomes Short: continue to attend cardiac rehab and exercise consistently to benefit mental health. Long: mantain good mental health habits.    Interventions Encouraged to attend Cardiac Rehabilitation for the exercise    Continue Psychosocial Services  Follow up required by staff             Vocational Rehabilitation: Provide vocational rehab assistance to qualifying candidates.   Vocational Rehab Evaluation & Intervention:   Education: Education Goals:  Education classes will be provided on a  variety of topics geared toward better understanding of heart health and risk factor modification. Participant will state understanding/return demonstration of topics presented as noted by education test scores.  Learning Barriers/Preferences:  Learning Barriers/Preferences - 02/19/22 1436       Learning Barriers/Preferences   Learning Barriers None    Learning Preferences None             General Cardiac Education Topics:  AED/CPR: - Group verbal and written instruction with the use of models to demonstrate the basic use of the AED with the basic ABC's of resuscitation.   Anatomy and Cardiac Procedures: - Group verbal and visual presentation and models provide information about basic cardiac anatomy and function. Reviews the testing methods done to diagnose heart disease and the outcomes of the test results. Describes the treatment choices: Medical Management, Angioplasty, or Coronary Bypass Surgery for treating various heart conditions including Myocardial Infarction, Angina, Valve Disease, and Cardiac Arrhythmias.  Written material given at graduation. Flowsheet Row Cardiac Rehab from 03/12/2022 in [facility] Cardiac and Pulmonary Rehab  Date 03/12/22  Educator [facility]  Instruction Review Code 1- Verbalizes Understanding       Medication Safety: - Group verbal and visual instruction to review commonly prescribed medications for heart and lung disease. Reviews the medication, class of the drug, and side effects. Includes the steps to properly store meds and maintain the prescription regimen.  Written material given at graduation.   Intimacy: - Group verbal instruction through game format to discuss how heart and lung disease can affect sexual intimacy. Written material given at graduation..   Know Your Numbers and Heart Failure: - Group verbal and visual instruction to discuss disease risk factors for cardiac and pulmonary disease and treatment  options.  Reviews associated critical values for Overweight/Obesity, Hypertension, Cholesterol, and Diabetes.  Discusses basics of heart failure: signs/symptoms and treatments.  Introduces Heart Failure Zone chart for action plan for heart failure.  Written material given at graduation. Flowsheet Row Cardiac Rehab from 03/12/2022 in [facility] Cardiac and Pulmonary Rehab  Education need identified 03/03/22       Infection Prevention: - Provides verbal and written material to individual with discussion of infection control including proper hand washing and proper equipment cleaning during exercise session. Flowsheet Row Cardiac Rehab from 03/12/2022 in [facility] Cardiac and Pulmonary Rehab  Date 02/19/22  Educator [facility]  Instruction Review Code 1- Verbalizes Understanding       Falls Prevention: - Provides verbal and written material to individual with discussion of falls prevention and safety. Flowsheet Row Cardiac Rehab from 03/12/2022 in [facility] Cardiac and Pulmonary Rehab  Date 02/19/22  Educator [facility]  Instruction Review Code 1- Verbalizes Understanding       Other: -Provides group and verbal instruction on various topics (see comments)   Knowledge Questionnaire Score:  Knowledge Questionnaire Score - 03/03/22 1514       Knowledge Questionnaire Score   Pre Score 22/26             Core Components/Risk Factors/Patient Goals at Admission:  Personal Goals and Risk Factors at Admission - 03/03/22 1515       Core Components/Risk Factors/Patient Goals on Admission    Weight Management Yes;Weight Loss;Obesity    Intervention Weight Management: Develop a combined nutrition and exercise program designed to reach desired caloric intake, while maintaining appropriate intake of nutrient and fiber, sodium and fats, and appropriate energy expenditure required for the weight goal.;Weight Management: Provide education and appropriate resources to help participant work on  and attain dietary  goals.;Weight Management/Obesity: Establish reasonable short term and long term weight goals.;Obesity: Provide education and appropriate resources to help participant work on and attain dietary goals.    Admit Weight 213 lb 4.8 oz (96.8 kg)    Goal Weight: Short Term 210 lb (95.3 kg)    Goal Weight: Long Term 205 lb (93 kg)    Expected Outcomes Short Term: Continue to assess and modify interventions until short term weight is achieved;Long Term: Adherence to nutrition and physical activity/exercise program aimed toward attainment of established weight goal;Weight Loss: Understanding of general recommendations for a balanced deficit meal plan, which promotes 1-2 lb weight loss per week and includes a negative energy balance of [phone] kcal/d;Understanding recommendations for meals to include 15-35% energy as protein, 25-35% energy from fat, 35-60% energy from carbohydrates, less than 229m of dietary cholesterol, 20-35 gm of total fiber daily;Understanding of distribution of calorie intake throughout the day with the consumption of 4-5 meals/snacks    Improve shortness of breath with ADL's Yes    Intervention Provide education, individualized exercise plan and daily activity instruction to help decrease symptoms of SOB with activities of daily living.    Expected Outcomes Short Term: Improve cardiorespiratory fitness to achieve a reduction of symptoms when performing ADLs;Long Term: Be able to perform more ADLs without symptoms or delay the onset of symptoms    Diabetes Yes    Intervention Provide education about signs/symptoms and action to take for hypo/hyperglycemia.;Provide education about proper nutrition, including hydration, and aerobic/resistive exercise prescription along with prescribed medications to achieve blood glucose in normal ranges: Fasting glucose 65-99 mg/dL    Expected Outcomes Short Term: Participant verbalizes understanding of the signs/symptoms and immediate care of  hyper/hypoglycemia, proper foot care and importance of medication, aerobic/resistive exercise and nutrition plan for blood glucose control.;Long Term: Attainment of HbA1C < 7%.    Hypertension Yes    Intervention Provide education on lifestyle modifcations including regular physical activity/exercise, weight management, moderate sodium restriction and increased consumption of fresh fruit, vegetables, and low fat dairy, alcohol moderation, and smoking cessation.;Monitor prescription use compliance.    Expected Outcomes Short Term: Continued assessment and intervention until BP is < 140/94mHG in hypertensive participants. < 130/8076mG in hypertensive participants with diabetes, heart failure or chronic kidney disease.;Long Term: Maintenance of blood pressure at goal levels.    Lipids Yes    Intervention Provide education and support for participant on nutrition & aerobic/resistive exercise along with prescribed medications to achieve LDL <24m29mDL >40mg22m Expected Outcomes Short Term: Participant states understanding of desired cholesterol values and is compliant with medications prescribed. Participant is following exercise prescription and nutrition guidelines.;Long Term: Cholesterol controlled with medications as prescribed, with individualized exercise RX and with personalized nutrition plan. Value goals: LDL < 24mg,37m > 40 mg.             Education:Diabetes - Individual verbal and written instruction to review signs/symptoms of diabetes, desired ranges of glucose level fasting, after meals and with exercise. Acknowledge that pre and post exercise glucose checks will be done for 3 sessions at entry of program. [place]  Date 03/03/22  [facility] Code 1- Verbalizes Understanding       Core Components/Risk Factors/Patient Goals Review:   Goals and Risk Factor Review     Row Name 03/24/22 [id number]  Core Components/Risk Factors/Patient Goals Review   Personal Goals Review Weight Management/Obesity;Hypertension;Improve shortness of breath with ADL's;Diabetes;Lipids       Review Patient reports that he monitors his blood sugar and blood presure at home. He takes all meds as prescribed. Diabetes is still diet controlled and he reports that his blood sugar numbers are good.       Expected Outcomes Short: continue to take all meds and continue to monitor blood sugars and blood pressures at home. Long: continue to work on weight loss goal.                Core Components/Risk Factors/Patient Goals at Discharge (Final Review):   Goals and Risk Factor Review - 03/24/22 0934       Core Components/Risk Factors/Patient Goals Review   Personal Goals Review Weight Management/Obesity;Hypertension;Improve shortness of breath with ADL's;Diabetes;Lipids    Review Patient reports that he monitors his blood sugar and blood presure at home. He takes all meds as prescribed. Diabetes is still diet controlled and he reports that his blood sugar numbers are good.    Expected Outcomes Short: continue to take all meds and continue to monitor blood sugars and blood pressures at home. Long: continue to work on weight loss goal.             ITP Comments:  ITP Comments     Row Name 02/19/22 1440 03/03/22 1507 03/05/22 0836 03/10/22 1038 04/02/22 1100   ITP Comments Virtual Visit completed. Patient informed on EP and RD appointment and 6 Minute walk test. Patient also informed of patient health questionnaires on My Chart. Patient Verbalizes understanding. Visit diagnosis can be found in [facility] 02/02/2022. Completed 6MWT and gym orientation. Initial ITP created and sent for review to Dr. Emily Filbert, Medical Director. 30 Day review completed. Medical Director ITP review done, changes made as directed, and signed approval by Medical Director.   new to program First full day of exercise!  Patient was  oriented to gym and equipment including functions, settings, policies, and procedures.  Patient's individual exercise prescription and treatment plan were reviewed.  All starting workloads were established based on the results of the 6 minute walk test done at initial orientation visit.  The plan for exercise progression was also introduced and progression will be customized based on patient's performance and goals. 30 Day review completed. Medical Director ITP review done, changes made as directed, and signed approval by Medical Director.     new to prgram            Comments:

## 2022-04-02 NOTE — Progress Notes (Signed)
Daily Session Note  Patient Details  Name: Chase Keller MRN: [id number] Date of Birth: 1940/07/25 Referring Provider:   Flowsheet Row Cardiac Rehab from 03/03/2022 in [facility] Cardiac and Pulmonary Rehab  Referring Provider Lendon Collar MD ([place])       Encounter Date: 04/02/2022  Check In:  Session Check In - 04/02/22 0924       Check-In   Supervising physician immediately available to respond to emergencies See telemetry face sheet for immediately available ER MD    Location [facility]-Cardiac & Pulmonary Rehab    Staff Present Darlyne Russian, RN, ADN;Jessica Luan [place], MA, RCEP, CCRP, CCET;Noah Tickle, BS, Exercise Physiologist    Virtual Visit No    Medication changes reported     No    Fall or balance concerns reported    No    Warm-up and Cool-down Performed on first and last piece of equipment    Resistance Training Performed Yes    VAD Patient? No    PAD/SET Patient? No      Pain Assessment   Currently in Pain? No/denies                Social History   Tobacco Use  Smoking Status Never  Smokeless Tobacco Never  Tobacco Comments   father smoked in the home while pt was growing up//ex-wife smoked    Goals Met:  Independence with exercise equipment Exercise tolerated well No report of concerns or symptoms today Strength training completed today  Goals Unmet:  Not Applicable  Comments: Pt able to follow exercise prescription today without complaint.  Will continue to monitor for progression.    Dr. Emily Filbert is Medical Director for [place].  Dr. Ottie Glazier is Medical Director for [facility] Pulmonary Rehabilitation.

## 2022-04-04 ENCOUNTER — Encounter: Payer: No Typology Code available for payment source | Admitting: *Deleted

## 2022-04-04 DIAGNOSIS — Z955 Presence of coronary angioplasty implant and graft: Secondary | ICD-10-CM

## 2022-04-04 DIAGNOSIS — Z48812 Encounter for surgical aftercare following surgery on the circulatory system: Secondary | ICD-10-CM | POA: Diagnosis not present

## 2022-04-04 NOTE — Progress Notes (Signed)
Daily Session Note  Patient Details  Name: RENALD HAITHCOCK MRN: [id number] Date of Birth: [birth year] Referring Provider:   Flowsheet Row Cardiac Rehab from 03/03/2022 in [facility] Cardiac and Pulmonary Rehab  Referring Provider Lendon Collar MD ([place])       Encounter Date: 04/04/2022  Check In:  Session Check In - 04/04/22 1010       Check-In   Supervising physician immediately available to respond to emergencies See telemetry face sheet for immediately available ER MD    Location [facility]-Cardiac & Pulmonary Rehab    Staff Present Heath Lark, RN, BSN, CCRP;Jessica [place], MA, RCEP, CCRP, CCET;Joseph [place], [place]    Virtual Visit No    Medication changes reported     No    Fall or balance concerns reported    No    Warm-up and Cool-down Performed on first and last piece of equipment    Resistance Training Performed Yes    VAD Patient? No    PAD/SET Patient? No      Pain Assessment   Currently in Pain? No/denies                Social History   Tobacco Use  Smoking Status Never  Smokeless Tobacco Never  Tobacco Comments   father smoked in the home while pt was growing up//ex-wife smoked    Goals Met:  Independence with exercise equipment Exercise tolerated well No report of concerns or symptoms today  Goals Unmet:  Not Applicable  Comments: Pt able to follow exercise prescription today without complaint.  Will continue to monitor for progression.    Dr. Emily Filbert is Medical Director for [place].  Dr. Ottie Glazier is Medical Director for [facility] Pulmonary Rehabilitation.

## 2022-04-09 ENCOUNTER — Encounter: Payer: No Typology Code available for payment source | Admitting: *Deleted

## 2022-04-11 ENCOUNTER — Encounter: Payer: No Typology Code available for payment source | Attending: Family Medicine | Admitting: *Deleted

## 2022-04-11 DIAGNOSIS — Z955 Presence of coronary angioplasty implant and graft: Secondary | ICD-10-CM | POA: Insufficient documentation

## 2022-04-11 DIAGNOSIS — Z48812 Encounter for surgical aftercare following surgery on the circulatory system: Secondary | ICD-10-CM | POA: Diagnosis not present

## 2022-04-11 DIAGNOSIS — I213 ST elevation (STEMI) myocardial infarction of unspecified site: Secondary | ICD-10-CM | POA: Diagnosis present

## 2022-04-11 DIAGNOSIS — I252 Old myocardial infarction: Secondary | ICD-10-CM | POA: Insufficient documentation

## 2022-04-11 NOTE — Progress Notes (Signed)
Daily Session Note  Patient Details  Name: Chase Keller MRN: [id number] Date of Birth: Aug 21, 1940 Referring Provider:   Flowsheet Row Cardiac Rehab from 03/03/2022 in [facility] Cardiac and Pulmonary Rehab  Referring Provider Lendon Collar MD ([place])       Encounter Date: 04/11/2022  Check In:  Session Check In - 04/11/22 1007       Check-In   Supervising physician immediately available to respond to emergencies See telemetry face sheet for immediately available ER MD    Location [facility]-Cardiac & Pulmonary Rehab    Staff Present Heath Lark, RN, BSN, CCRP;Jessica [place], MA, RCEP, CCRP, CCET;Joseph [place], [place]    Virtual Visit No    Medication changes reported     No    Fall or balance concerns reported    No    Warm-up and Cool-down Performed on first and last piece of equipment    Resistance Training Performed Yes    VAD Patient? No    PAD/SET Patient? No      Pain Assessment   Currently in Pain? No/denies                Social History   Tobacco Use  Smoking Status Never  Smokeless Tobacco Never  Tobacco Comments   father smoked in the home while pt was growing up//ex-wife smoked    Goals Met:  Independence with exercise equipment Exercise tolerated well No report of concerns or symptoms today  Goals Unmet:  Not Applicable  Comments: Pt able to follow exercise prescription today without complaint.  Will continue to monitor for progression.    Dr. Emily Filbert is Medical Director for [place].  Dr. Ottie Glazier is Medical Director for [facility] Pulmonary Rehabilitation.

## 2022-04-14 ENCOUNTER — Encounter: Payer: No Typology Code available for payment source | Admitting: *Deleted

## 2022-04-14 DIAGNOSIS — Z955 Presence of coronary angioplasty implant and graft: Secondary | ICD-10-CM

## 2022-04-14 NOTE — Progress Notes (Signed)
Daily Session Note  Patient Details  Name: Chase Keller MRN: [id number] Date of Birth: 1940-05-21 Referring Provider:   Flowsheet Row Cardiac Rehab from 03/03/2022 in [facility] Cardiac and Pulmonary Rehab  Referring Provider Lendon Collar MD ([place])       Encounter Date: 04/14/2022  Check In:  Session Check In - 04/14/22 0939       Check-In   Supervising physician immediately available to respond to emergencies See telemetry face sheet for immediately available ER MD    Location [facility]-Cardiac & Pulmonary Rehab    Staff Present Darlyne Russian, RN, Doyce Para, BS, ACSM CEP, Exercise Physiologist;Noah Tickle, BS, Exercise Physiologist    Virtual Visit No    Medication changes reported     No    Fall or balance concerns reported    No    Warm-up and Cool-down Performed on first and last piece of equipment    Resistance Training Performed Yes    VAD Patient? No    PAD/SET Patient? No      Pain Assessment   Currently in Pain? No/denies                Social History   Tobacco Use  Smoking Status Never  Smokeless Tobacco Never  Tobacco Comments   father smoked in the home while pt was growing up//ex-wife smoked    Goals Met:  Independence with exercise equipment Exercise tolerated well No report of concerns or symptoms today Strength training completed today  Goals Unmet:  Not Applicable  Comments: Pt able to follow exercise prescription today without complaint.  Will continue to monitor for progression.    Dr. Emily Filbert is Medical Director for [place].  Dr. Ottie Glazier is Medical Director for [facility] Pulmonary Rehabilitation.

## 2022-04-16 ENCOUNTER — Encounter: Payer: No Typology Code available for payment source | Admitting: *Deleted

## 2022-04-18 ENCOUNTER — Encounter: Payer: No Typology Code available for payment source | Admitting: *Deleted

## 2022-04-18 DIAGNOSIS — I213 ST elevation (STEMI) myocardial infarction of unspecified site: Secondary | ICD-10-CM

## 2022-04-18 DIAGNOSIS — Z955 Presence of coronary angioplasty implant and graft: Secondary | ICD-10-CM | POA: Diagnosis not present

## 2022-04-18 NOTE — Progress Notes (Signed)
Reviewed home exercise with pt today.  Pt plans to walking and weights at home for exercise.  Reviewed THR, pulse, RPE, sign and symptoms, pulse oximetery and when to call 911 or MD.  Also discussed weather considerations and indoor options.  Pt voiced understanding.

## 2022-04-18 NOTE — Progress Notes (Signed)
Daily Session Note  Patient Details  Name: Chase Keller MRN: [id number] Date of Birth: 1940-09-03 Referring Provider:   Flowsheet Row Cardiac Rehab from 03/03/2022 in [facility]  Referring Provider Lendon Collar MD ([place])       Encounter Date: 04/18/2022  Check In:  Session Check In - 04/18/22 1008       Check-In   Supervising physician immediately available to respond to emergencies See telemetry face sheet for immediately available ER MD    Location [facility]-Cardiac & Pulmonary Rehab    Staff Present Heath Lark, RN, BSN, CCRP;Jessica Kilmichael, MA, RCEP, CCRP, CCET;Joseph Malone, [place]    Virtual Visit No    Medication changes reported     No    Fall or balance concerns reported    No    Warm-up and Cool-down Performed on first and last piece of equipment    Resistance Training Performed Yes    VAD Patient? No    PAD/SET Patient? No      Pain Assessment   Currently in Pain? No/denies                Social History   Tobacco Use  Smoking Status Never  Smokeless Tobacco Never  Tobacco Comments   father smoked in the home while pt was growing up//ex-wife smoked    Goals Met:  Independence with exercise equipment Exercise tolerated well No report of concerns or symptoms today  Goals Unmet:  Not Applicable  Comments: Pt able to follow exercise prescription today without complaint.  Will continue to monitor for progression.    Dr. Emily Filbert is Medical Director for [place].  Dr. Ottie Glazier is Medical Director for [facility] Pulmonary Rehabilitation.

## 2022-04-21 ENCOUNTER — Encounter: Payer: No Typology Code available for payment source | Admitting: *Deleted

## 2022-04-21 DIAGNOSIS — Z955 Presence of coronary angioplasty implant and graft: Secondary | ICD-10-CM | POA: Diagnosis not present

## 2022-04-21 NOTE — Progress Notes (Signed)
Daily Session Note  Patient Details  Name: Chase Keller MRN: [id number] Date of Birth: 09/21/1940 Referring Provider:   Flowsheet Row Cardiac Rehab from 03/03/2022 in [facility] Cardiac and Pulmonary Rehab  Referring Provider Lendon Collar MD ([place])       Encounter Date: 04/21/2022  Check In:  Session Check In - 04/21/22 0957       Check-In   Supervising physician immediately available to respond to emergencies See telemetry face sheet for immediately available ER MD    Location [facility]    Staff Present Darlyne Russian, RN, ADN    Virtual Visit No    Medication changes reported     No    Fall or balance concerns reported    No    Warm-up and Cool-down Performed on first and last piece of equipment    Resistance Training Performed Yes    VAD Patient? No    PAD/SET Patient? No      Pain Assessment   Currently in Pain? No/denies                Social History   Tobacco Use  Smoking Status Never  Smokeless Tobacco Never  Tobacco Comments   father smoked in the home while pt was growing up//ex-wife smoked    Goals Met:  Independence with exercise equipment Exercise tolerated well No report of concerns or symptoms today Strength training completed today  Goals Unmet:  Not Applicable  Comments: Pt able to follow exercise prescription today without complaint.  Will continue to monitor for progression.    Dr. Emily Filbert is Medical Director for [place].  Dr. Ottie Glazier is Medical Director for [facility] Pulmonary Rehabilitation.

## 2022-04-23 ENCOUNTER — Encounter: Payer: No Typology Code available for payment source | Admitting: *Deleted

## 2022-04-23 DIAGNOSIS — Z955 Presence of coronary angioplasty implant and graft: Secondary | ICD-10-CM | POA: Diagnosis not present

## 2022-04-23 DIAGNOSIS — I213 ST elevation (STEMI) myocardial infarction of unspecified site: Secondary | ICD-10-CM

## 2022-04-23 NOTE — Progress Notes (Signed)
Daily Session Note  Patient Details  Name: Chase Keller MRN: [id number] Date of Birth: 1940-08-05 Referring Provider:   Flowsheet Row Cardiac Rehab from 03/03/2022 in [facility] Cardiac and Pulmonary Rehab  Referring Provider Lendon Collar MD ([place])       Encounter Date: 04/23/2022  Check In:  Session Check In - 04/23/22 0924       Check-In   Supervising physician immediately available to respond to emergencies See telemetry face sheet for immediately available ER MD    Location [facility] Re[facility]    Staff Present Darlyne Russian, RN, ADN;Joseph Hood, RCP,RRT,BSRT;Noah Tickle, BS, Exercise Physiologist    Virtual Visit No    Medication changes reported     No    Fall or balance concerns reported    No    Tobacco Cessation No Change    Warm-up and Cool-down Performed on first and last piece of equipment    Resistance Training Performed Yes    VAD Patient? No    PAD/SET Patient? No      Pain Assessment   Currently in Pain? No/denies                Social History   Tobacco Use  Smoking Status Never  Smokeless Tobacco Never  Tobacco Comments   father smoked in the home while pt was growing up//ex-wife smoked    Goals Met:  Independence with exercise equipment Exercise tolerated well No report of concerns or symptoms today Strength training completed today  Goals Unmet:  Not Applicable  Comments: Pt able to follow exercise prescription today without complaint.  Will continue to monitor for progression.    Dr. Emily Filbert is Medical Director for [place].  Dr. Ottie Glazier is Medical Director for [facility] Pulmonary Rehabilitation.

## 2022-04-25 ENCOUNTER — Encounter: Payer: No Typology Code available for payment source | Admitting: *Deleted

## 2022-04-25 DIAGNOSIS — Z955 Presence of coronary angioplasty implant and graft: Secondary | ICD-10-CM

## 2022-04-25 NOTE — Progress Notes (Signed)
Daily Session Note  Patient Details  Name: Chase Keller MRN: [id number] Date of Birth: November 24, 1940 Referring Provider:   Flowsheet Row Cardiac Rehab from 03/03/2022 in [facility] Cardiac and Pulmonary Rehab  Referring Provider Lendon Collar MD ([place])       Encounter Date: 04/25/2022  Check In:  Session Check In - 04/25/22 1041       Check-In   Supervising physician immediately available to respond to emergencies See telemetry face sheet for immediately available ER MD    Location [facility]    Staff Present Heath Lark, RN, BSN, CCRP;Marvell Fuller, PhD, RN, CNS, CEN;Joseph Tessie Fass, [place]    Virtual Visit No    Medication changes reported     No    Fall or balance concerns reported    No    Warm-up and Cool-down Performed on first and last piece of equipment    Resistance Training Performed Yes    VAD Patient? No    PAD/SET Patient? No      Pain Assessment   Currently in Pain? No/denies                Social History   Tobacco Use  Smoking Status Never  Smokeless Tobacco Never  Tobacco Comments   father smoked in the home while pt was growing up//ex-wife smoked    Goals Met:  Independence with exercise equipment Exercise tolerated well No report of concerns or symptoms today  Goals Unmet:  Not Applicable  Comments: Pt able to follow exercise prescription today without complaint.  Will continue to monitor for progression.    Dr. Emily Filbert is Medical Director for [place].  Dr. Ottie Glazier is Medical Director for [facility] Pulmonary Rehabilitation.

## 2022-04-28 ENCOUNTER — Encounter: Payer: No Typology Code available for payment source | Admitting: *Deleted

## 2022-04-28 DIAGNOSIS — Z955 Presence of coronary angioplasty implant and graft: Secondary | ICD-10-CM

## 2022-04-28 NOTE — Progress Notes (Signed)
Daily Session Note  Patient Details  Name: Chase Keller MRN: [id number] Date of Birth: 04-12-1940 Referring Provider:   Flowsheet Row Cardiac Rehab from 03/03/2022 in [facility] Cardiac and Pulmonary Rehab  Referring Provider Lendon Collar MD ([place])       Encounter Date: 04/28/2022  Check In:  Session Check In - 04/28/22 0937       Check-In   Supervising physician immediately available to respond to emergencies See telemetry face sheet for immediately available ER MD    Location [facility] Pul[facility] Re[facility]    Staff Present Darlyne Russian, RN, Doyce Para, BS, ACSM CEP, Exercise Physiologist;Noah Tickle, BS, Exercise Physiologist    Virtual Visit No    Medication changes reported     No    Fall or balance concerns reported    No    Warm-up and Cool-down Performed on first and last piece of equipment    Resistance Training Performed Yes    VAD Patient? No    PAD/SET Patient? No      Pain Assessment   Currently in Pain? No/denies                Social History   Tobacco Use  Smoking Status Never  Smokeless Tobacco Never  Tobacco Comments   father smoked in the home while pt was growing up//ex-wife smoked    Goals Met:  Independence with exercise equipment Exercise tolerated well No report of concerns or symptoms today Strength training completed today  Goals Unmet:  Not Applicable  Comments: Pt able to follow exercise prescription today without complaint.  Will continue to monitor for progression.    Dr. Emily Filbert is Medical Director for [place].  Dr. Ottie Glazier is Medical Director for [facility] Pulmonary Rehabilitation.

## 2022-04-30 ENCOUNTER — Encounter: Payer: Self-pay | Admitting: *Deleted

## 2022-04-30 ENCOUNTER — Encounter: Payer: No Typology Code available for payment source | Admitting: *Deleted

## 2022-04-30 DIAGNOSIS — Z955 Presence of coronary angioplasty implant and graft: Secondary | ICD-10-CM | POA: Diagnosis not present

## 2022-04-30 NOTE — Progress Notes (Signed)
Daily Session Note  Patient Details  Name: Chase Keller MRN: [id number] Date of Birth: Jan 20, 1941 Referring Provider:   Flowsheet Row Cardiac Rehab from 03/03/2022 in [facility] Cardiac and Pulmonary Rehab  Referring Provider Lendon Collar MD ([place])       Encounter Date: 04/30/2022  Check In:  Session Check In - 04/30/22 6384       Check-In   Supervising physician immediately available to respond to emergencies See telemetry face sheet for immediately available ER MD    Location [facility] Pulmonary Rehab    Staff Present Darlyne Russian, RN, ADN;Joseph Tessie Fass, RCP,RRT,BSRT;Noah Tickle, BS, Exercise Physiologist    Virtual Visit No    Medication changes reported     No    Fall or balance concerns reported    No    Warm-up and Cool-down Performed on first and last piece of equipment    Resistance Training Performed Yes    VAD Patient? No    PAD/SET Patient? No      Pain Assessment   Currently in Pain? No/denies                Social History   Tobacco Use  Smoking Status Never  Smokeless Tobacco Never  Tobacco Comments   father smoked in the home while pt was growing up//ex-wife smoked    Goals Met:  Independence with exercise equipment Exercise tolerated well No report of concerns or symptoms today Strength training completed today  Goals Unmet:  Not Applicable  Comments: Pt able to follow exercise prescription today without complaint.  Will continue to monitor for progression.    Dr. Emily Filbert is Medical Director for [place].  Dr. Ottie Glazier is Medical Director for [facility] Pulmonary Rehabilitation.

## 2022-04-30 NOTE — Progress Notes (Signed)
Cardiac Individual Treatment Plan  Patient Details  Name: Chase Keller MRN: [id number] Date of Birth: [birth year] Referring Provider:   Flowsheet Row Cardiac Rehab from 03/03/2022 in [facility] Cardiac and Pulmonary Rehab  Referring Provider Lendon Collar MD ([place])       Initial Encounter Date:  Flowsheet Row Cardiac Rehab from 03/03/2022 in [facility] Cardiac and Pulmonary Rehab  Date 03/03/22       Visit Diagnosis: Status post coronary artery stent placement  Patient's Home Medications on Admission:  Current Outpatient Medications:    ACAI BERRY PO, Take 3,000 mg by mouth daily., Disp: , Rfl:    albuterol (VENTOLIN HFA) 108 (90 Base) MCG/ACT inhaler, Inhale 2 puffs into the lungs every 6 (six) hours as needed for wheezing or shortness of breath., Disp: , Rfl:    ALFALFA PO, Take 1,300 mg by mouth 2 (two) times daily., Disp: , Rfl:    allopurinol (ZYLOPRIM) 300 MG tablet, Take 300 mg by mouth daily., Disp: , Rfl:    amLODipine (NORVASC) 2.5 MG tablet, Take 2.5 mg by mouth daily as needed (If systolic BP over 226.)., Disp: , Rfl:    ammonium lactate (LAC-HYDRIN) 12 % lotion, Apply 1 application topically daily as needed for dry skin., Disp: , Rfl:    ammonium lactate (LAC-HYDRIN) 12 % lotion, APPLY SMALL AMOUNT TOPICALLY IN THE MORNING FOR DRY SKIN (Patient not taking: Reported on 02/19/2022), Disp: , Rfl:    apixaban (ELIQUIS) 5 MG TABS tablet, TAKE ONE TABLET BY MOUTH EVERY 12 HOURS CAUTION BLOOD THINNER THIS REPLACES YOUR WARFARIN, Disp: , Rfl:    APPLE CIDER VINEGAR PO, Take 480 mg by mouth daily., Disp: , Rfl:    Ascorbic Acid (VITAMIN C) 1000 MG tablet, Take 1,000 mg by mouth daily., Disp: , Rfl:    aspirin EC 81 MG tablet, Take 81 mg by mouth daily., Disp: , Rfl:    benzonatate (TESSALON) 100 MG capsule, Take 1 capsule by mouth 3 (three) times daily as needed., Disp: , Rfl:    Calcium Carb-Cholecalciferol (CALCIUM/VITAMIN D) 600-400 MG-UNIT TABS, Take 1 tablet by mouth 2 (two)  times daily. (Patient not taking: Reported on 02/19/2022), Disp: , Rfl:    Calcium Carbonate-Vit D-Min (CALCIUM 600+D PLUS MINERALS) 600-400 MG-UNIT TABS, Take 1 tablet by mouth 2 (two) times daily., Disp: , Rfl:    carvedilol (COREG) 6.25 MG tablet, Take by mouth 2 (two) times daily with a meal., Disp: , Rfl:    Cholecalciferol (VITAMIN D3) 250 MCG (10000 UT) capsule, Take 10,000 Units by mouth daily., Disp: , Rfl:    Cholecalciferol 125 MCG (5000 UT) TABS, Take by mouth., Disp: , Rfl:    Cinnamon 500 MG capsule, Take 500 mg by mouth daily., Disp: , Rfl:    Coenzyme Q10 200 MG capsule, Take 200 mg by mouth daily., Disp: , Rfl:    cyanocobalamin 100 MCG tablet, TAKE ONE TABLET BY MOUTH EVERY DAY TO PREVENT VITAMIN B12 DEFICIENCY (Patient not taking: Reported on 02/19/2022), Disp: , Rfl:    cyanocobalamin 1000 MCG tablet, Take 1,000 mcg by mouth daily., Disp: , Rfl:    ezetimibe (ZETIA) 10 MG tablet, Take 10 mg by mouth daily., Disp: , Rfl:    ferrous sulfate 325 (65 FE) MG tablet, Take 325 mg by mouth daily with breakfast., Disp: , Rfl:    fluticasone (FLONASE) 50 MCG/ACT nasal spray, Place 2 sprays into both nostrils daily as needed for rhinitis. (Patient not taking: Reported on 02/19/2022), Disp: , Rfl:  fluticasone (FLONASE) 50 MCG/ACT nasal spray, Place into the nose., Disp: , Rfl:    Glucosamine-Chondroit-Vit C-Mn (GLUCOSAMINE CHONDR 500 COMPLEX) CAPS, Take 2 capsules by mouth 2 (two) times daily., Disp: , Rfl:    lidocaine (LIDODERM) 5 %, Place onto the skin., Disp: , Rfl:    MAGNESIUM PO, Take by mouth., Disp: , Rfl:    niacin 500 MG CR capsule, Take 500 mg by mouth at bedtime. B3, Disp: , Rfl:    Potassium 99 MG TABS, Take 99 mg by mouth 2 (two) times daily., Disp: , Rfl:    potassium gluconate 595 (99 K) MG TABS tablet, Take by mouth., Disp: , Rfl:    pyridOXINE (B-6) 50 MG tablet, Take 100 mg by mouth daily., Disp: , Rfl:    pyridOXINE (B-6) 50 MG tablet, Take 2 tablets by mouth  daily. (Patient not taking: Reported on 02/19/2022), Disp: , Rfl:    RED YEAST RICE EXTRACT PO, Take 600 mg by mouth 2 (two) times daily., Disp: , Rfl:    riboflavin (VITAMIN B-2) 100 MG TABS tablet, Take 100 mg by mouth daily., Disp: , Rfl:    senna-docusate (SENOKOT-S) 8.6-50 MG tablet, Take 1 tablet by mouth 2 (two) times daily., Disp: , Rfl:    Skin Protectants, Misc. (HYDROCERIN EX), Apply 1 application topically daily as needed (dry skin)., Disp: , Rfl:    Skin Protectants, Misc. (MINERIN CREME EX), Apply topically., Disp: , Rfl:    spironolactone (ALDACTONE) 25 MG tablet, Take 12.5 mg by mouth daily. (Patient not taking: Reported on 02/19/2022), Disp: , Rfl:    spironolactone (ALDACTONE) 25 MG tablet, Take 0.5 tablets by mouth daily., Disp: , Rfl:    tamsulosin (FLOMAX) 0.4 MG CAPS capsule, Take 0.4 mg by mouth at bedtime. (Patient not taking: Reported on 02/19/2022), Disp: , Rfl:    tamsulosin (FLOMAX) 0.4 MG CAPS capsule, Take 1 capsule by mouth at bedtime., Disp: , Rfl:    thiamine 100 MG tablet, Take 100 mg by mouth daily., Disp: , Rfl:    traMADol (ULTRAM) 50 MG tablet, Take 50 mg by mouth every 6 (six) hours as needed for moderate pain., Disp: , Rfl:    trospium (SANCTURA) 20 MG tablet, Take 20 mg by mouth 2 (two) times daily., Disp: , Rfl:    warfarin (COUMADIN) 1 MG tablet, Take 3 mg by mouth at bedtime., Disp: , Rfl:    zinc gluconate 50 MG tablet, Take 50 mg by mouth daily., Disp: , Rfl:   Past Medical History: Past Medical History:  Diagnosis Date   Agent orange exposure    Atrial fibrillation ([place]) 2013   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   CHF (congestive heart failure) (HCC)    COPD (chronic obstructive pulmonary disease) (HCC)    Coronary artery disease    Diabetes mellitus without complication (HCC)    diet controlled   Dysrhythmia     A fib   Heart murmur    / 1 MD said yes, 1 said no   History of blood transfusion    during CABG   History of kidney stones     Hyperlipidemia    Hypertension    Left knee DJD    MRSA (methicillin resistant staph aureus) culture positive    Drainage from Left Knee approx 2005   Myocardial infarction ([place])    mild in 1998 per Wife    PONV (postoperative nausea and vomiting)    S/P coronary artery bypass graft x  2    1998   Shortness of breath    Sleep apnea    CPAP   Stented coronary artery     Tobacco Use: Social History   Tobacco Use  Smoking Status Never  Smokeless Tobacco Never  Tobacco Comments   father smoked in the home while pt was growing up//ex-wife smoked    Labs: Review Flowsheet       Latest Ref Rng & Units 03/28/2011 01/14/2012 02/25/2012 06/04/2020  Labs for ITP Cardiac and Pulmonary Rehab  Cholestrol 0 - 200 mg/dL 179  158  - -  LDL (calc) 0 - 99 mg/dL 124  110  - -  HDL-C >39.00 mg/dL 26.00  24.00  - -  Trlycerides 0.0 - 149.0 mg/dL 146.0  122.0  - -  Hemoglobin A1c 4.8 - 5.6 % - - 6.3  6.1      Exercise Target Goals: Exercise Program Goal: Individual exercise prescription set using results from initial 6 min walk test and THRR while considering  patient's activity barriers and safety.   Exercise Prescription Goal: Initial exercise prescription builds to 30-45 minutes a day of aerobic activity, 2-3 days per week.  Home exercise guidelines will be given to patient during program as part of exercise prescription that the participant will acknowledge.   Education: Aerobic Exercise: - Group verbal and visual presentation on the components of exercise prescription. Introduces F.I.T.T principle from ACSM for exercise prescriptions.  Reviews F.I.T.T. principles of aerobic exercise including progression. Written material given at graduation. Flowsheet Row Cardiac Rehab from 04/30/2022 in [facility] Cardiac and Pulmonary Rehab  Education need identified 03/03/22       Education: Resistance Exercise: - Group verbal and visual presentation on the components of exercise prescription.  Introduces F.I.T.T principle from ACSM for exercise prescriptions  Reviews F.I.T.T. principles of resistance exercise including progression. Written material given at graduation.    Education: Exercise & Equipment Safety: - Individual verbal instruction and demonstration of equipment use and safety with use of the equipment. Flowsheet Row Cardiac Rehab from 04/30/2022 in [facility] Cardiac and Pulmonary Rehab  Date 02/19/22  Educator [facility]  Instruction Review Code 1- Verbalizes Understanding       Education: Exercise Physiology & General Exercise Guidelines: - Group verbal and written instruction with models to review the exercise physiology of the cardiovascular system and associated critical values. Provides general exercise guidelines with specific guidelines to those with heart or lung disease.    Education: Flexibility, Balance, Mind/Body Relaxation: - Group verbal and visual presentation with interactive activity on the components of exercise prescription. Introduces F.I.T.T principle from ACSM for exercise prescriptions. Reviews F.I.T.T. principles of flexibility and balance exercise training including progression. Also discusses the mind body connection.  Reviews various relaxation techniques to help reduce and manage stress (i.e. Deep breathing, progressive muscle relaxation, and visualization). Balance handout provided to take home. Written material given at graduation.   Activity Barriers & Risk Stratification:  Activity Barriers & Cardiac Risk Stratification - 03/03/22 1507       Activity Barriers & Cardiac Risk Stratification   Activity Barriers Left Knee Replacement;Arthritis;Deconditioning;Muscular Weakness;Shortness of Breath   arthritis in feet   Cardiac Risk Stratification Moderate             6 Minute Walk:  6 Minute Walk     Row Name 03/03/22 1507         6 Minute Walk   Phase Initial     Distance 1040 feet  Walk Time 6 minutes     # of Rest Breaks 0      MPH 197     METS 1.12     RPE 11     Perceived Dyspnea  2     VO2 Peak 5.31     Symptoms Yes (comment)     Comments SOB     Resting HR 61 bpm     Resting BP 128/64     Resting Oxygen Saturation  96 %     Exercise Oxygen Saturation  during 6 min walk 97 %     Max Ex. HR 105 bpm     Max Ex. BP 136/74     2 Minute Post BP 122/60              Oxygen Initial Assessment:   Oxygen Re-Evaluation:   Oxygen Discharge (Final Oxygen Re-Evaluation):   Initial Exercise Prescription:  Initial Exercise Prescription - 03/03/22 1500       Date of Initial Exercise RX and Referring Provider   Date 03/03/22    Referring Provider Ruther[place], Vicente Males MD ([place])      Oxygen   Maintain Oxygen Saturation 88% or higher      Recumbant Bike   Level 1    RPM 50    Watts 5    Minutes 15    METs 2      NuStep   Level 1    SPM 80    Minutes 15    METs 2      T5 Nustep   Level 1    SPM 80    Minutes 15    METs 2      Biostep-RELP   Level 1    SPM 50    Minutes 15    METs 2      Track   Laps 27    Minutes 15    METs 2.47      Prescription Details   Frequency (times per week) 3    Duration Progress to 30 minutes of continuous aerobic without signs/symptoms of physical distress      Intensity   THRR 40-80% of Max Heartrate 92-123    Ratings of Perceived Exertion 11-13    Perceived Dyspnea 0-4      Progression   Progression Continue to progress workloads to maintain intensity without signs/symptoms of physical distress.      Resistance Training   Training Prescription Yes    Weight 5 lb    Reps 10-15             Perform Capillary Blood Glucose checks as needed.  Exercise Prescription Changes:   Exercise Prescription Changes     Row Name 03/03/22 1500 03/18/22 1500 04/02/22 1100 04/15/22 1500       Response to Exercise   Blood Pressure (Admit) 128/64 128/72 124/62 126/62    Blood Pressure (Exercise) 136/74 150/88 142/68 134/74    Blood Pressure (Exit)  122/60 122/62 116/74 122/62    Heart Rate (Admit) 61 bpm 67 bpm 70 bpm 81 bpm    Heart Rate (Exercise) 105 bpm 103 bpm 103 bpm 104 bpm    Heart Rate (Exit) 75 bpm 66 bpm 76 bpm 80 bpm    Oxygen Saturation (Admit) 96 % -- -- --    Oxygen Saturation (Exercise) 97 % -- -- --    Rating of Perceived Exertion (Exercise) '11 13 12 12    '$ Perceived Dyspnea (Exercise) 2 -- -- --  Symptoms SOB none none none    Comments walk test results 3rd full day of exercise -- --    Duration -- Progress to 30 minutes of  aerobic without signs/symptoms of physical distress Progress to 30 minutes of  aerobic without signs/symptoms of physical distress Continue with 30 min of aerobic exercise without signs/symptoms of physical distress.    Intensity -- THRR unchanged THRR unchanged THRR unchanged      Progression   Progression -- Continue to progress workloads to maintain intensity without signs/symptoms of physical distress. Continue to progress workloads to maintain intensity without signs/symptoms of physical distress. Continue to progress workloads to maintain intensity without signs/symptoms of physical distress.    Average METs -- 2.25 2.58 2.7      Resistance Training   Training Prescription -- Yes Yes Yes    Weight -- 5 lb 5 lb 2 lb    Reps -- 10-15 10-15 10-15      Interval Training   Interval Training -- No No No      Recumbant Bike   Level -- 3 -- --    Minutes -- 15 -- --    METs -- 2 -- --      NuStep   Level -- 2 4 --    Minutes -- 30 30 --    METs -- 2 3 --      T5 Nustep   Level -- 2 -- 4    Minutes -- 15 -- 30    METs -- -- -- 3      Biostep-RELP   Level -- '3 3 2    '$ Minutes -- '15 30 30    '$ METs -- '3 2 2      '$ Oxygen   Maintain Oxygen Saturation -- 88% or higher 88% or higher 88% or higher             Exercise Comments:   Exercise Comments     Row Name 03/10/22 1038           Exercise Comments First full day of exercise!  Patient was oriented to gym and equipment  including functions, settings, policies, and procedures.  Patient's individual exercise prescription and treatment plan were reviewed.  All starting workloads were established based on the results of the 6 minute walk test done at initial orientation visit.  The plan for exercise progression was also introduced and progression will be customized based on patient's performance and goals.                Exercise Goals and Review:   Exercise Goals     Row Name 03/03/22 1512             Exercise Goals   Increase Physical Activity Yes       Intervention Provide advice, education, support and counseling about physical activity/exercise needs.;Develop an individualized exercise prescription for aerobic and resistive training based on initial evaluation findings, risk stratification, comorbidities and participant's personal goals.       Expected Outcomes Short Term: Attend rehab on a regular basis to increase amount of physical activity.;Long Term: Add in home exercise to make exercise part of routine and to increase amount of physical activity.;Long Term: Exercising regularly at least 3-5 days a week.       Increase Strength and Stamina Yes       Intervention Provide advice, education, support and counseling about physical activity/exercise needs.;Develop an individualized exercise prescription for aerobic and resistive training based on  initial evaluation findings, risk stratification, comorbidities and participant's personal goals.       Expected Outcomes Short Term: Increase workloads from initial exercise prescription for resistance, speed, and METs.;Short Term: Perform resistance training exercises routinely during rehab and add in resistance training at home;Long Term: Improve cardiorespiratory fitness, muscular endurance and strength as measured by increased METs and functional capacity (6MWT)       Able to understand and use rate of perceived exertion (RPE) scale Yes       Intervention  Provide education and explanation on how to use RPE scale       Expected Outcomes Short Term: Able to use RPE daily in rehab to express subjective intensity level;Long Term:  Able to use RPE to guide intensity level when exercising independently       Able to understand and use Dyspnea scale Yes       Intervention Provide education and explanation on how to use Dyspnea scale       Expected Outcomes Short Term: Able to use Dyspnea scale daily in rehab to express subjective sense of shortness of breath during exertion;Long Term: Able to use Dyspnea scale to guide intensity level when exercising independently       Knowledge and understanding of Target Heart Rate Range (THRR) Yes       Intervention Provide education and explanation of THRR including how the numbers were predicted and where they are located for reference       Expected Outcomes Short Term: Able to state/look up THRR;Short Term: Able to use daily as guideline for intensity in rehab;Long Term: Able to use THRR to govern intensity when exercising independently       Able to check pulse independently Yes       Intervention Provide education and demonstration on how to check pulse in carotid and radial arteries.;Review the importance of being able to check your own pulse for safety during independent exercise       Expected Outcomes Short Term: Able to explain why pulse checking is important during independent exercise;Long Term: Able to check pulse independently and accurately       Understanding of Exercise Prescription Yes       Intervention Provide education, explanation, and written materials on patient's individual exercise prescription       Expected Outcomes Short Term: Able to explain program exercise prescription;Long Term: Able to explain home exercise prescription to exercise independently                Exercise Goals Re-Evaluation :  Exercise Goals Re-Evaluation     Row Name 03/10/22 1038 03/18/22 1529 03/24/22 0926  04/02/22 1139 04/15/22 1537     Exercise Goal Re-Evaluation   Exercise Goals Review Able to understand and use rate of perceived exertion (RPE) scale;Able to understand and use Dyspnea scale;Knowledge and understanding of Target Heart Rate Range (THRR);Understanding of Exercise Prescription Increase Physical Activity;Increase Strength and Stamina;Understanding of Exercise Prescription Increase Physical Activity;Increase Strength and Stamina;Understanding of Exercise Prescription -- Increase Physical Activity;Increase Strength and Stamina;Understanding of Exercise Prescription   Comments Reviewed RPE scale, THR and program prescription with pt today.  Pt voiced understanding and was given a copy of goals to take home. Miliano is off to a good start with rehab for the first couple of sessions he has been here. He has already increased on all of his seated machines between level 2-3. Patient was not willing to try walking on the track yet, but will keep encouraging patient  until he feels ready. Will continue to monitor as he progresses in the program. Weyman is tolerating his exercise well. He has been coming consistently to cardiac rehab. He has made some increases in his workloads since starting the program. He does have a bad foot and is working with the [facility] to have that checked before he starts walking in the program. Jace is doing well in rehab. He recently improved his overall average MET level to 2.58 METs. He also was able to improve to level 4 on the T4. He has been doing his seated machines for 30 minutes and has not done any walking during his rehab sessions. We will encourage Montay that improving his walking is an important part of the program. We will continue to monitor his progress in the program. Bolden continues to do well considering what he is limited with. He did increase to level 4 on the T4 Nustep. Even though he is unable to walk at this time, he has been hitting his THR each session. We will continue  to follow up on progress.   Expected Outcomes Short: Use RPE daily to regulate intensity. Long: Follow program prescription in THR. Short: Incorporate couple laps of walking on track when ready Long: Continue to increase overall MET level Short: Incorporate couple laps of walking on track when ready Long: Continue to increase overall MET level Short: Begin walking on track. Long: Continue to increase strength and stamina. Short: Continue to increase seated machines Long: Continue to increase overall MET level    Row Name 04/18/22 0926             Exercise Goal Re-Evaluation   Exercise Goals Review Increase Physical Activity;Increase Strength and Stamina;Understanding of Exercise Prescription;Able to understand and use rate of perceived exertion (RPE) scale;Knowledge and understanding of Target Heart Rate Range (THRR);Able to understand and use Dyspnea scale;Able to check pulse independently       Comments Tyreon is doing well in rehab.  He is already walking some at home. Reviewed home exercise with pt today.  Pt plans to walking and weights at home for exercise.  Reviewed THR, pulse, RPE, sign and symptoms, pulse oximetery and when to call 911 or MD.  Also discussed weather considerations and indoor options.  Pt voiced understanding.       Expected Outcomes Short: Start to add in exercise at home consistently Long: Continue to improve stamina                Discharge Exercise Prescription (Final Exercise Prescription Changes):  Exercise Prescription Changes - 04/15/22 1500       Response to Exercise   Blood Pressure (Admit) 126/62    Blood Pressure (Exercise) 134/74    Blood Pressure (Exit) 122/62    Heart Rate (Admit) 81 bpm    Heart Rate (Exercise) 104 bpm    Heart Rate (Exit) 80 bpm    Rating of Perceived Exertion (Exercise) 12    Symptoms none    Duration Continue with 30 min of aerobic exercise without signs/symptoms of physical distress.    Intensity THRR unchanged       Progression   Progression Continue to progress workloads to maintain intensity without signs/symptoms of physical distress.    Average METs 2.7      Resistance Training   Training Prescription Yes    Weight 2 lb    Reps 10-15      Interval Training   Interval Training No  T5 Nustep   Level 4    Minutes 30    METs 3      Biostep-RELP   Level 2    Minutes 30    METs 2      Oxygen   Maintain Oxygen Saturation 88% or higher             Nutrition:  Target Goals: Understanding of nutrition guidelines, daily intake of sodium '1500mg'$ , cholesterol '200mg'$ , calories 30% from fat and 7% or less from saturated fats, daily to have 5 or more servings of fruits and vegetables.  Education: All About Nutrition: -Group instruction provided by verbal, written material, interactive activities, discussions, models, and posters to present general guidelines for heart healthy nutrition including fat, fiber, MyPlate, the role of sodium in heart healthy nutrition, utilization of the nutrition label, and utilization of this knowledge for meal planning. Follow up email sent as well. Written material given at graduation. Flowsheet Row Cardiac Rehab from 04/30/2022 in [facility] Cardiac and Pulmonary Rehab  Education need identified 03/03/22       Biometrics:  Pre Biometrics - 03/03/22 1513       Pre Biometrics   Height 5' 5.9" (1.674 m)    Weight 213 lb 4.8 oz (96.8 kg)    Waist Circumference 44.75 inches    Hip Circumference 41.5 inches    Waist to Hip Ratio 1.08 %    BMI (Calculated) 34.53    Single Leg Stand 4.9 seconds              Nutrition Therapy Plan and Nutrition Goals:  Nutrition Therapy & Goals - 03/03/22 1042       Nutrition Therapy   Diet Heart healthy, low Na, T2DM    Protein (specify units) 85-95g    Fiber 28 grams    Whole Grain Foods 3 servings    Saturated Fats 16 max. grams    Fruits and Vegetables 8 servings/day    Sodium 2 grams      Personal Nutrition  Goals   Nutrition Goal ST: LT: limit sodium <2g/day, limit saturated fat <16g, follow MyPlate guidelines    Comments 82 y.o. M admitted to cardiac rehab s/p stent placement.PMHx includes HTN, CAD, CHF, a.fib, OSA, T2DM, hypothyroidism. Relevant medications includes apple cider vinegar pill, calcium, vitamin D3, CoQ10, B-12, ferrous sulfate, vitamin C, cinnamon, magnesium, potassium, red yeast extract, B-6, B-2, thiamine, tramadol, zinc, senokot, acai berry PO, alfalfa PO, MSM. Pt reports taking many supplements for a long time; discussed how many can be unnecessary, it is important to get our nutrients from food first when we can, supplements should be third party tested, you can exceed recommended doeses especially with fat soluble vitamins such as vitamin D, and to check with your health care provider before starting any supplements as they can also interact with medications and medical conditions, pt voiced understanding. B: scrambled eggs or grits or oatmeal L: vegetable plate at restaurant (macoroni salad, green beans, peaches). He does not have raw red tomato as that bothers his gums. He limits bread and tries to not eat it. D: [employer] chicken on tuesday, vegetables when he goes out to eat. He also limits his dessert. He reports cooking about 2x/week. Renard reports he would like to lose weight around his midsection - discussed how we cannot spot-reduce fat, aging can cause natural body composition changes, and some behavior changes that can help such as including fiber, reducing excess fat and added sugar, and honoring hunger. Reviewed  heart healthy nutrition and diabetes guidelines. Discussed how going out to eat can make it hard to include whole grains, include variety, limit saturated fat, and limit sodium. Discussed some tips on how to eat more heart healthy options when out to eat such as including more non-starchy vegetables, choosing more lean proteins such as chicken, limiting red meat, limiting  creamy sauces/dressings and limiting fried or breaded foods.      Intervention Plan   Intervention Prescribe, educate and counsel regarding individualized specific dietary modifications aiming towards targeted core components such as weight, hypertension, lipid management, diabetes, heart failure and other comorbidities.;Nutrition handout(s) given to patient.    Expected Outcomes Short Term Goal: Understand basic principles of dietary content, such as calories, fat, sodium, cholesterol and nutrients.;Short Term Goal: A plan has been developed with personal nutrition goals set during dietitian appointment.;Long Term Goal: Adherence to prescribed nutrition plan.             Nutrition Assessments:  MEDIFICTS Score Key: ?70 Need to make dietary changes  40-70 Heart Healthy Diet ? 40 Therapeutic Level Cholesterol Diet  Flowsheet Row Cardiac Rehab from 03/03/2022 in [facility] Cardiac and Pulmonary Rehab  Picture Your Plate Total Score on Admission 69      Picture Your Plate Scores: <79 Unhealthy dietary pattern with much room for improvement. 41-50 Dietary pattern unlikely to meet recommendations for good health and room for improvement. 51-60 More healthful dietary pattern, with some room for improvement.  >60 Healthy dietary pattern, although there may be some specific behaviors that could be improved.    Nutrition Goals Re-Evaluation:  Nutrition Goals Re-Evaluation     [place] Name 03/24/22 0931 04/18/22 0930           Goals   Nutrition Goal ST: LT: limit sodium <2g/day, limit saturated fat <16g, follow MyPlate guidelines Short: continue to to limit sodium and fat. Long: continue to make heart healthy dietary choices to help control risk factors.      Comment Patient reports that he has been trying to cut back on sodium an fat. He now eats his seafood broiled instead of fried. He has also been removing skin from chicken. Kaiyan is doing well with his diet.  He has cut back on bread and  sodium in general.  His weakness is yellow tomato sandwiches when he can find them.  He is trying to eat more fruits and vegetables.  His goal is to eat healthier in general.  He continues to work on his portions and cutting back.  He usually will order a veggie plate now when they go out to eat.      Expected Outcome Short: continue to to limit sodium and fat. Long: continue to make heart healthy dietary choices to help control risk factors. Short:  Continue to work on portion sizes Long: conitnue to focus on heart healthy eating               Nutrition Goals Discharge (Final Nutrition Goals Re-Evaluation):  Nutrition Goals Re-Evaluation - 04/18/22 0930       Goals   Nutrition Goal Short: continue to to limit sodium and fat. Long: continue to make heart healthy dietary choices to help control risk factors.    Comment Keilen is doing well with his diet.  He has cut back on bread and sodium in general.  His weakness is yellow tomato sandwiches when he can find them.  He is trying to eat more fruits and vegetables.  His goal  is to eat healthier in general.  He continues to work on his portions and cutting back.  He usually will order a veggie plate now when they go out to eat.    Expected Outcome Short:  Continue to work on portion sizes Long: conitnue to focus on heart healthy eating             Psychosocial: Target Goals: Acknowledge presence or absence of significant depression and/or stress, maximize coping skills, provide positive support system. Participant is able to verbalize types and ability to use techniques and skills needed for reducing stress and depression.   Education: Stress, Anxiety, and Depression - Group verbal and visual presentation to define topics covered.  Reviews how body is impacted by stress, anxiety, and depression.  Also discusses healthy ways to reduce stress and to treat/manage anxiety and depression.  Written material given at graduation. Flowsheet Row  Cardiac Rehab from 11/30/2019 in [facility] Cardiac and Pulmonary Rehab  Date 11/09/19  Educator SB  Instruction Review Code 1- [employer] Understanding       Education: Sleep Hygiene -Provides group verbal and written instruction about how sleep can affect your health.  Define sleep hygiene, discuss sleep cycles and impact of sleep habits. Review good sleep hygiene tips.    Initial Review & Psychosocial Screening:  Initial Psych Review & Screening - 02/19/22 1437       Initial Review   Current issues with None Identified      Family Dynamics   Good Support System? Yes    Comments His wife is his support system. He has done the program before and is ready to get healthier.      Barriers   Psychosocial barriers to participate in program There are no identifiable barriers or psychosocial needs.;The patient should benefit from training in stress management and relaxation.      Screening Interventions   Interventions Encouraged to exercise;To provide support and resources with identified psychosocial needs;Provide feedback about the scores to participant    Expected Outcomes Short Term goal: Utilizing psychosocial counselor, staff and physician to assist with identification of specific Stressors or current issues interfering with healing process. Setting desired goal for each stressor or current issue identified.;Short Term goal: Identification and review with participant of any Quality of Life or Depression concerns found by scoring the questionnaire.;Long Term Goal: Stressors or current issues are controlled or eliminated.;Long Term goal: The participant improves quality of Life and PHQ9 Scores as seen by post scores and/or verbalization of changes             Quality of Life Scores:   Quality of Life - 03/03/22 1513       Quality of Life   Select Quality of Life      Quality of Life Scores   Health/Function Pre 17.5 %    Socioeconomic Pre 19.71 %    Psych/Spiritual Pre 22.79 %     Family Pre 18 %    GLOBAL Pre 19.12 %            Scores of 19 and below usually indicate a poorer quality of life in these areas.  A difference of  2-3 points is a clinically meaningful difference.  A difference of 2-3 points in the total score of the Quality of Life Index has been associated with significant improvement in overall quality of life, self-image, physical symptoms, and general health in studies assessing change in quality of life.  PHQ-9: Review Flowsheet  03/03/2022 10/27/2019 02/26/2012  Depression screen PHQ 2/9  Decreased Interest 0 0 0  Down, Depressed, Hopeless 0 0 0  PHQ - 2 Score 0 0 0  Altered sleeping 0 0 -  Tired, decreased energy 0 0 -  Change in appetite 0 0 -  Feeling bad or failure about yourself  0 0 -  Trouble concentrating 0 0 -  Moving slowly or fidgety/restless 0 0 -  Suicidal thoughts 0 0 -  PHQ-9 Score 0 0 -  Difficult doing work/chores Not difficult at all Not difficult at all -   Interpretation of Total Score  Total Score Depression Severity:  1-4 = Minimal depression, 5-9 = Mild depression, 10-14 = Moderate depression, 15-19 = Moderately severe depression, 20-27 = Severe depression   Psychosocial Evaluation and Intervention:  Psychosocial Evaluation - 02/19/22 1438       Psychosocial Evaluation & Interventions   Interventions Stress management education;Relaxation education;Encouraged to exercise with the program and follow exercise prescription    Comments His wife is his support system. He has done the program before and is ready to get healthier.    Expected Outcomes Short: attend cardiac rehab for exercise and education. Long: develop positive self care habits.    Continue Psychosocial Services  Follow up required by staff             Psychosocial Re-Evaluation:  Psychosocial Re-Evaluation     [place] Name 03/24/22 [phone] 04/18/22 0926           Psychosocial Re-Evaluation   Current issues with None Identified  Current Stress Concerns      Comments Patient reports no new mental health or sleep concerns. Abyan is doing well in rehab.  He had a traffic violation and the lawyer was able to get it lowered penalty.  He just has pay the fines and court fees. He is doing well mentally. He is doing well sleeping on his BiPap and is compliant with using it nightly.      Expected Outcomes Short: continue to attend cardiac rehab and exercise consistently to benefit mental health. Long: mantain good mental health habits. Short: continue to focus on positive Long: continued compliance with BiPap      Interventions Encouraged to attend Cardiac Rehabilitation for the exercise Encouraged to attend Cardiac Rehabilitation for the exercise      Continue Psychosocial Services  Follow up required by staff Follow up required by staff               Psychosocial Discharge (Final Psychosocial Re-Evaluation):  Psychosocial Re-Evaluation - 04/18/22 0926       Psychosocial Re-Evaluation   Current issues with Current Stress Concerns    Comments Dezman is doing well in rehab.  He had a traffic violation and the lawyer was able to get it lowered penalty.  He just has pay the fines and court fees. He is doing well mentally. He is doing well sleeping on his BiPap and is compliant with using it nightly.    Expected Outcomes Short: continue to focus on positive Long: continued compliance with BiPap    Interventions Encouraged to attend Cardiac Rehabilitation for the exercise    Continue Psychosocial Services  Follow up required by staff             Vocational Rehabilitation: Provide vocational rehab assistance to qualifying candidates.   Vocational Rehab Evaluation & Intervention:   Education: Education Goals: Education classes will be provided on a variety of topics geared toward  better understanding of heart health and risk factor modification. Participant will state understanding/return demonstration of topics presented  as noted by education test scores.  Learning Barriers/Preferences:  Learning Barriers/Preferences - 02/19/22 1436       Learning Barriers/Preferences   Learning Barriers None    Learning Preferences None             General Cardiac Education Topics:  AED/CPR: - Group verbal and written instruction with the use of models to demonstrate the basic use of the AED with the basic ABC's of resuscitation.   Anatomy and Cardiac Procedures: - Group verbal and visual presentation and models provide information about basic cardiac anatomy and function. Reviews the testing methods done to diagnose heart disease and the outcomes of the test results. Describes the treatment choices: Medical Management, Angioplasty, or Coronary Bypass Surgery for treating various heart conditions including Myocardial Infarction, Angina, Valve Disease, and Cardiac Arrhythmias.  Written material given at graduation. Flowsheet Row Cardiac Rehab from 04/30/2022 in [facility] Cardiac and Pulmonary Rehab  Date 03/12/22  Educator [facility]  Instruction Review Code 1- Verbalizes Understanding       Medication Safety: - Group verbal and visual instruction to review commonly prescribed medications for heart and lung disease. Reviews the medication, class of the drug, and side effects. Includes the steps to properly store meds and maintain the prescription regimen.  Written material given at graduation.   Intimacy: - Group verbal instruction through game format to discuss how heart and lung disease can affect sexual intimacy. Written material given at graduation..   Know Your Numbers and Heart Failure: - Group verbal and visual instruction to discuss disease risk factors for cardiac and pulmonary disease and treatment options.  Reviews associated critical values for Overweight/Obesity, Hypertension, Cholesterol, and Diabetes.  Discusses basics of heart failure: signs/symptoms and treatments.  Introduces Heart Failure Zone chart for  action plan for heart failure.  Written material given at graduation. Flowsheet Row Cardiac Rehab from 04/30/2022 in [facility] Cardiac and Pulmonary Rehab  Education need identified 03/03/22       Infection Prevention: - Provides verbal and written material to individual with discussion of infection control including proper hand washing and proper equipment cleaning during exercise session. Flowsheet Row Cardiac Rehab from 04/30/2022 in [facility] Cardiac and Pulmonary Rehab  Date 02/19/22  Educator [place]  Instruction Review Code 1- Verbalizes Understanding       Falls Prevention: - Provides verbal and written material to individual with discussion of falls prevention and safety. Flowsheet Row Cardiac Rehab from 04/30/2022 in [facility] Cardiac and Pulmonary Rehab  Date 02/19/22  Educator [facility]  Instruction Review Code 1- Verbalizes Understanding       Other: -Provides group and verbal instruction on various topics (see comments)   Knowledge Questionnaire Score:  Knowledge Questionnaire Score - 03/03/22 1514       Knowledge Questionnaire Score   Pre Score 22/26             Core Components/Risk Factors/Patient Goals at Admission:  Personal Goals and Risk Factors at Admission - 03/03/22 1515       Core Components/Risk Factors/Patient Goals on Admission    Weight Management Yes;Weight Loss;Obesity    Intervention Weight Management: Develop a combined nutrition and exercise program designed to reach desired caloric intake, while maintaining appropriate intake of nutrient and fiber, sodium and fats, and appropriate energy expenditure required for the weight goal.;Weight Management: Provide education and appropriate resources to help participant work on and attain dietary goals.;Weight  Management/Obesity: Establish reasonable short term and long term weight goals.;Obesity: Provide education and appropriate resources to help participant work on and attain dietary goals.    Admit Weight 213 lb 4.8  oz (96.8 kg)    Goal Weight: Short Term 210 lb (95.3 kg)    Goal Weight: Long Term 205 lb (93 kg)    Expected Outcomes Short Term: Continue to assess and modify interventions until short term weight is achieved;Long Term: Adherence to nutrition and physical activity/exercise program aimed toward attainment of established weight goal;Weight Loss: Understanding of general recommendations for a balanced deficit meal plan, which promotes 1-2 lb weight loss per week and includes a negative energy balance of [phone] kcal/d;Understanding recommendations for meals to include 15-35% energy as protein, 25-35% energy from fat, 35-60% energy from carbohydrates, less than '200mg'$  of dietary cholesterol, 20-35 gm of total fiber daily;Understanding of distribution of calorie intake throughout the day with the consumption of 4-5 meals/snacks    Improve shortness of breath with ADL's Yes    Intervention Provide education, individualized exercise plan and daily activity instruction to help decrease symptoms of SOB with activities of daily living.    Expected Outcomes Short Term: Improve cardiorespiratory fitness to achieve a reduction of symptoms when performing ADLs;Long Term: Be able to perform more ADLs without symptoms or delay the onset of symptoms    Diabetes Yes    Intervention Provide education about signs/symptoms and action to take for hypo/hyperglycemia.;Provide education about proper nutrition, including hydration, and aerobic/resistive exercise prescription along with prescribed medications to achieve blood glucose in normal ranges: Fasting glucose 65-99 mg/dL    Expected Outcomes Short Term: Participant verbalizes understanding of the signs/symptoms and immediate care of hyper/hypoglycemia, proper foot care and importance of medication, aerobic/resistive exercise and nutrition plan for blood glucose control.;Long Term: Attainment of HbA1C < 7%.    Hypertension Yes    Intervention Provide education on lifestyle  modifcations including regular physical activity/exercise, weight management, moderate sodium restriction and increased consumption of fresh fruit, vegetables, and low fat dairy, alcohol moderation, and smoking cessation.;Monitor prescription use compliance.    Expected Outcomes Short Term: Continued assessment and intervention until BP is < 140/22m HG in hypertensive participants. < 130/880mHG in hypertensive participants with diabetes, heart failure or chronic kidney disease.;Long Term: Maintenance of blood pressure at goal levels.    Lipids Yes    Intervention Provide education and support for participant on nutrition & aerobic/resistive exercise along with prescribed medications to achieve LDL '70mg'$ , HDL >'40mg'$ .    Expected Outcomes Short Term: Participant states understanding of desired cholesterol values and is compliant with medications prescribed. Participant is following exercise prescription and nutrition guidelines.;Long Term: Cholesterol controlled with medications as prescribed, with individualized exercise RX and with personalized nutrition plan. Value goals: LDL < '70mg'$ , HDL > 40 mg.             Education:Diabetes - Individual verbal and written instruction to review signs/symptoms of diabetes, desired ranges of glucose level fasting, after meals and with exercise. Acknowledge that pre and post exercise glucose checks will be done for 3 sessions at entry of program. [place]  Date 03/03/22  [facility] Code 1- Verbalizes Understanding       Core Components/Risk Factors/Patient Goals Review:   Goals and Risk Factor Review     Row Name 03/24/22 [id number]/12/24 0932           Core  Components/Risk Factors/Patient Goals Review   Personal Goals Review Weight Management/Obesity;Hypertension;Improve shortness of breath with ADL's;Diabetes;Lipids Weight Management/Obesity;Hypertension;Improve  shortness of breath with ADL's;Diabetes;Lipids      Review Patient reports that he monitors his blood sugar and blood presure at home. He takes all meds as prescribed. Diabetes is still diet controlled and he reports that his blood sugar numbers are good. Taejon is doing well in rehab.  His weight is staying steady and he is feeling good.  His pressures are doing well and he checks on occassion at home.  His sugars vary between 105-115 each week.  He usually checks both on Monday mornings.  His wife is also diabetic so they both watch what they are eating.  He is breathing a little better overall.  He has found that he is only getting short of breath when he works harder.      Expected Outcomes Short: continue to take all meds and continue to monitor blood sugars and blood pressures at home. Long: continue to work on weight loss goal. Short: Conitnue to keep close eye on sugars Long: conitnue to monitor risk factors               Core Components/Risk Factors/Patient Goals at Discharge (Final Review):   Goals and Risk Factor Review - 04/18/22 0932       Core Components/Risk Factors/Patient Goals Review   Personal Goals Review Weight Management/Obesity;Hypertension;Improve shortness of breath with ADL's;Diabetes;Lipids    Review Nixon is doing well in rehab.  His weight is staying steady and he is feeling good.  His pressures are doing well and he checks on occassion at home.  His sugars vary between 105-115 each week.  He usually checks both on Monday mornings.  His wife is also diabetic so they both watch what they are eating.  He is breathing a little better overall.  He has found that he is only getting short of breath when he works harder.    Expected Outcomes Short: Conitnue to keep close eye on sugars Long: conitnue to monitor risk factors             ITP Comments:  ITP Comments     Row Name 02/19/22 1440 03/03/22 1507 03/05/22 0836 03/10/22 1038 04/02/22 1100   ITP Comments Virtual  Visit completed. Patient informed on EP and RD appointment and 6 Minute walk test. Patient also informed of patient health questionnaires on My Chart. Patient Verbalizes understanding. Visit diagnosis can be found in [facility] 02/02/2022. Completed 6MWT and gym orientation. Initial ITP created and sent for review to Dr. Emily Filbert, Medical Director. 30 Day review completed. Medical Director ITP review done, changes made as directed, and signed approval by Medical Director.   new to program First full day of exercise!  Patient was oriented to gym and equipment including functions, settings, policies, and procedures.  Patient's individual exercise prescription and treatment plan were reviewed.  All starting workloads were established based on the results of the 6 minute walk test done at initial orientation visit.  The plan for exercise progression was also introduced and progression will be customized based on patient's performance and goals. 30 Day review completed. Medical Director ITP review done, changes made as directed, and signed approval by Medical Director.     new to prgram    Row Name 04/30/22 1031           ITP Comments 30 Day review completed. Medical Director ITP review done, changes made as directed,  and signed approval by Medical Director.                Comments:

## 2022-05-02 ENCOUNTER — Encounter: Payer: No Typology Code available for payment source | Admitting: *Deleted

## 2022-05-02 DIAGNOSIS — Z955 Presence of coronary angioplasty implant and graft: Secondary | ICD-10-CM

## 2022-05-02 NOTE — Progress Notes (Signed)
Daily Session Note  Patient Details  Name: Chase Keller MRN: [id number] Date of Birth: 1940/09/30 Referring Provider:   Flowsheet Row Cardiac Rehab from 03/03/2022 in [facility] Cardiac and Pulmonary Rehab  Referring Provider Lendon Collar MD ([place])       Encounter Date: 05/02/2022  Check In:  Session Check In - 05/02/22 1013       Check-In   Supervising physician immediately available to respond to emergencies See telemetry face sheet for immediately available ER MD    Location [facility]-Cardiac & Pulmonary Rehab    Staff Present Heath Lark, RN, BSN, CCRP;Jessica [place], MA, RCEP, CCRP, CCET;Joseph [place], [place]    Virtual Visit No    Medication changes reported     No    Fall or balance concerns reported    No    Warm-up and Cool-down Performed on first and last piece of equipment    Resistance Training Performed Yes    VAD Patient? No    PAD/SET Patient? No      Pain Assessment   Currently in Pain? No/denies                Social History   Tobacco Use  Smoking Status Never  Smokeless Tobacco Never  Tobacco Comments   father smoked in the home while pt was growing up//ex-wife smoked    Goals Met:  Independence with exercise equipment Exercise tolerated well No report of concerns or symptoms today  Goals Unmet:  Not Applicable  Comments: Pt able to follow exercise prescription today without complaint.  Will continue to monitor for progression.    Dr. Emily Filbert is Medical Director for [place].  Dr. Ottie Glazier is Medical Director for [facility] Pulmonary Rehabilitation.

## 2022-05-05 ENCOUNTER — Encounter: Payer: No Typology Code available for payment source | Admitting: *Deleted

## 2022-05-05 DIAGNOSIS — Z955 Presence of coronary angioplasty implant and graft: Secondary | ICD-10-CM | POA: Diagnosis not present

## 2022-05-05 NOTE — Progress Notes (Signed)
Daily Session Note  Patient Details  Name: Chase Keller MRN: [id number] Date of Birth: 1940-10-26 Referring Provider:   Flowsheet Row Cardiac Rehab from 03/03/2022 in [facility] Cardiac and Pulmonary Rehab  Referring Provider Lendon Collar MD ([place])       Encounter Date: 05/05/2022  Check In:  Session Check In - 05/05/22 0958       Check-In   Supervising physician immediately available to respond to emergencies See telemetry face sheet for immediately available ER MD    Location [facility]-Cardiac & Pulmonary Rehab    Staff Present Darlyne Russian, RN, Doyce Para, BS, ACSM CEP, Exercise Physiologist;Noah Tickle, BS, Exercise Physiologist    Virtual Visit No    Medication changes reported     No    Fall or balance concerns reported    No    Warm-up and Cool-down Performed on first and last piece of equipment    Resistance Training Performed Yes    VAD Patient? No    PAD/SET Patient? No      Pain Assessment   Currently in Pain? No/denies                Social History   Tobacco Use  Smoking Status Never  Smokeless Tobacco Never  Tobacco Comments   father smoked in the home while pt was growing up//ex-wife smoked    Goals Met:  Independence with exercise equipment Exercise tolerated well No report of concerns or symptoms today Strength training completed today  Goals Unmet:  Not Applicable  Comments: Pt able to follow exercise prescription today without complaint.  Will continue to monitor for progression.    Dr. Emily Filbert is Medical Director for [place].  Dr. Ottie Glazier is Medical Director for [facility] Pulmonary Rehabilitation.

## 2022-05-07 ENCOUNTER — Encounter: Payer: No Typology Code available for payment source | Admitting: *Deleted

## 2022-05-07 DIAGNOSIS — Z955 Presence of coronary angioplasty implant and graft: Secondary | ICD-10-CM

## 2022-05-07 NOTE — Progress Notes (Signed)
Daily Session Note  Patient Details  Name: Chase Keller MRN: [id number] Date of Birth: Jul 14, 1940 Referring Provider:   Flowsheet Row Cardiac Rehab from 03/03/2022 in [facility] Cardiac and Pulmonary Rehab  Referring Provider Lendon Collar MD ([place])       Encounter Date: 05/07/2022  Check In:  Session Check In - 05/07/22 0933       Check-In   Supervising physician immediately available to respond to emergencies See telemetry face sheet for immediately available ER MD    Location [facility]-Cardiac & Pulmonary Rehab    Staff Present Darlyne Russian, RN, ADN;Meredith Sherryll Burger, RN BSN;Noah Tickle, BS, Exercise Physiologist    Virtual Visit No    Medication changes reported     No    Fall or balance concerns reported    No    Warm-up and Cool-down Performed on first and last piece of equipment    Resistance Training Performed Yes    VAD Patient? No    PAD/SET Patient? No      Pain Assessment   Currently in Pain? No/denies                Social History   Tobacco Use  Smoking Status Never  Smokeless Tobacco Never  Tobacco Comments   father smoked in the home while pt was growing up//ex-wife smoked    Goals Met:  Independence with exercise equipment Exercise tolerated well No report of concerns or symptoms today Strength training completed today  Goals Unmet:  Not Applicable  Comments: Pt able to follow exercise prescription today without complaint.  Will continue to monitor for progression.    Dr. Emily Filbert is Medical Director for [place].  Dr. Ottie Glazier is Medical Director for [facility] Pulmonary Rehabilitation.

## 2022-05-09 ENCOUNTER — Encounter: Payer: No Typology Code available for payment source | Attending: Family Medicine | Admitting: *Deleted

## 2022-05-09 DIAGNOSIS — Z955 Presence of coronary angioplasty implant and graft: Secondary | ICD-10-CM | POA: Diagnosis present

## 2022-05-09 DIAGNOSIS — I213 ST elevation (STEMI) myocardial infarction of unspecified site: Secondary | ICD-10-CM | POA: Diagnosis present

## 2022-05-09 NOTE — Progress Notes (Signed)
Daily Session Note  Patient Details  Name: Chase Keller MRN: [id number] Date of Birth: 06-13-1940 Referring Provider:   Flowsheet Row Cardiac Rehab from 03/03/2022 in [facility] Cardiac and Pulmonary Rehab  Referring Provider Lendon Collar MD ([place])       Encounter Date: 05/09/2022  Check In:  Session Check In - 05/09/22 1001       Check-In   Supervising physician immediately available to respond to emergencies See telemetry face sheet for immediately available ER MD    Location [facility]-Cardiac & Pulmonary Rehab    Staff Present Heath Lark, RN, BSN, CCRP;Jessica Rocky Ripple, MA, RCEP, CCRP, CCET;Joseph Can[place], [place]    Virtual Visit No    Medication changes reported     No    Fall or balance concerns reported    No    Warm-up and Cool-down Performed on first and last piece of equipment    Resistance Training Performed Yes    VAD Patient? No    PAD/SET Patient? No      Pain Assessment   Currently in Pain? No/denies                Social History   Tobacco Use  Smoking Status Never  Smokeless Tobacco Never  Tobacco Comments   father smoked in the home while pt was growing up//ex-wife smoked    Goals Met:  Independence with exercise equipment Exercise tolerated well No report of concerns or symptoms today  Goals Unmet:  Not Applicable  Comments: Pt able to follow exercise prescription today without complaint.  Will continue to monitor for progression.    Dr. Emily Filbert is Medical Director for [place].  Dr. Ottie Glazier is Medical Director for [facility] Pulmonary Rehabilitation.

## 2022-05-12 ENCOUNTER — Encounter: Payer: No Typology Code available for payment source | Admitting: *Deleted

## 2022-05-12 DIAGNOSIS — Z955 Presence of coronary angioplasty implant and graft: Secondary | ICD-10-CM | POA: Diagnosis not present

## 2022-05-12 NOTE — Progress Notes (Signed)
Daily Session Note  Patient Details  Name: Chase Keller MRN: [id number] Date of Birth: January 05, 1941 Referring Provider:   Flowsheet Row Cardiac Rehab from 03/03/2022 in [facility] Cardiac and Pulmonary Rehab  Referring Provider Lendon Collar MD ([place])       Encounter Date: 05/12/2022  Check In:  Session Check In - 05/12/22 0920       Check-In   Supervising physician immediately available to respond to emergencies See telemetry face sheet for immediately available ER MD    Location [facility]-Cardiac & Pulmonary Rehab    Staff Present Darlyne Russian, RN, Doyce Para, BS, ACSM CEP, Exercise Physiologist;Joseph Tessie Fass, [place]    Virtual Visit No    Medication changes reported     No    Fall or balance concerns reported    No    Warm-up and Cool-down Performed on first and last piece of equipment    Resistance Training Performed Yes    VAD Patient? No    PAD/SET Patient? No      Pain Assessment   Currently in Pain? No/denies                Social History   Tobacco Use  Smoking Status Never  Smokeless Tobacco Never  Tobacco Comments   father smoked in the home while pt was growing up//ex-wife smoked    Goals Met:  Independence with exercise equipment Exercise tolerated well No report of concerns or symptoms today Strength training completed today  Goals Unmet:  Not Applicable  Comments: Pt able to follow exercise prescription today without complaint.  Will continue to monitor for progression.    Dr. Emily Filbert is Medical Director for [place].  Dr. Ottie Glazier is Medical Director for [facility] Pulmonary Rehabilitation.

## 2022-05-14 ENCOUNTER — Encounter: Payer: No Typology Code available for payment source | Admitting: *Deleted

## 2022-05-14 DIAGNOSIS — Z955 Presence of coronary angioplasty implant and graft: Secondary | ICD-10-CM

## 2022-05-14 NOTE — Progress Notes (Signed)
Daily Session Note  Patient Details  Name: Chase Keller MRN: [id number] Date of Birth: [birth year] Referring Provider:   Flowsheet Row Cardiac Rehab from 03/03/2022 in [facility] Cardiac and Pulmonary Rehab  Referring Provider Lendon Collar MD ([place])       Encounter Date: 05/14/2022  Check In:  Session Check In - 05/14/22 0924       Check-In   Supervising physician immediately available to respond to emergencies See telemetry face sheet for immediately available ER MD    Location [facility]-Cardiac & Pulmonary Rehab    Staff Present Darlyne Russian, RN, Lorin Mercy, MS, ACSM CEP, Exercise Physiologist;Noah Tickle, BS, Exercise Physiologist    Virtual Visit No    Medication changes reported     No    Fall or balance concerns reported    No    Warm-up and Cool-down Performed on first and last piece of equipment    Resistance Training Performed Yes    VAD Patient? No    PAD/SET Patient? No      Pain Assessment   Currently in Pain? No/denies                Social History   Tobacco Use  Smoking Status Never  Smokeless Tobacco Never  Tobacco Comments   father smoked in the home while pt was growing up//ex-wife smoked    Goals Met:  Independence with exercise equipment Exercise tolerated well No report of concerns or symptoms today Strength training completed today  Goals Unmet:  Not Applicable  Comments: Pt able to follow exercise prescription today without complaint.  Will continue to monitor for progression.    Dr. Emily Filbert is Medical Director for [place].  Dr. Ottie Glazier is Medical Director for [facility] Pulmonary Rehabilitation.

## 2022-05-19 ENCOUNTER — Encounter: Payer: No Typology Code available for payment source | Admitting: *Deleted

## 2022-05-26 ENCOUNTER — Encounter: Payer: No Typology Code available for payment source | Admitting: *Deleted

## 2022-05-28 ENCOUNTER — Encounter: Payer: No Typology Code available for payment source | Admitting: *Deleted

## 2022-05-28 ENCOUNTER — Encounter: Payer: Self-pay | Admitting: *Deleted

## 2022-05-28 DIAGNOSIS — Z955 Presence of coronary angioplasty implant and graft: Secondary | ICD-10-CM

## 2022-05-28 NOTE — Progress Notes (Signed)
Cardiac Individual Treatment Plan  Patient Details  Name: Chase Keller MRN: [id number] Date of Birth: [birth year] Referring Provider:   Flowsheet Row Cardiac Rehab from 03/03/2022 in [facility] Cardiac and Pulmonary Rehab  Referring Provider Lendon Collar MD ([place])       Initial Encounter Date:  Flowsheet Row Cardiac Rehab from 03/03/2022 in [facility] Cardiac and Pulmonary Rehab  Date 03/03/22       Visit Diagnosis: Status post coronary artery stent placement  Patient's Home Medications on Admission:  Current Outpatient Medications:    ACAI BERRY PO, Take 3,000 mg by mouth daily., Disp: , Rfl:    albuterol (VENTOLIN HFA) 108 (90 Base) MCG/ACT inhaler, Inhale 2 puffs into the lungs every 6 (six) hours as needed for wheezing or shortness of breath., Disp: , Rfl:    ALFALFA PO, Take 1,300 mg by mouth 2 (two) times daily., Disp: , Rfl:    allopurinol (ZYLOPRIM) 300 MG tablet, Take 300 mg by mouth daily., Disp: , Rfl:    amLODipine (NORVASC) 2.5 MG tablet, Take 2.5 mg by mouth daily as needed (If systolic BP over 226.)., Disp: , Rfl:    ammonium lactate (LAC-HYDRIN) 12 % lotion, Apply 1 application topically daily as needed for dry skin., Disp: , Rfl:    ammonium lactate (LAC-HYDRIN) 12 % lotion, APPLY SMALL AMOUNT TOPICALLY IN THE MORNING FOR DRY SKIN (Patient not taking: Reported on 02/19/2022), Disp: , Rfl:    apixaban (ELIQUIS) 5 MG TABS tablet, TAKE ONE TABLET BY MOUTH EVERY 12 HOURS CAUTION BLOOD THINNER THIS REPLACES YOUR WARFARIN, Disp: , Rfl:    APPLE CIDER VINEGAR PO, Take 480 mg by mouth daily., Disp: , Rfl:    Ascorbic Acid (VITAMIN C) 1000 MG tablet, Take 1,000 mg by mouth daily., Disp: , Rfl:    aspirin EC 81 MG tablet, Take 81 mg by mouth daily., Disp: , Rfl:    benzonatate (TESSALON) 100 MG capsule, Take 1 capsule by mouth 3 (three) times daily as needed., Disp: , Rfl:    Calcium Carb-Cholecalciferol (CALCIUM/VITAMIN D) 600-400 MG-UNIT TABS, Take 1 tablet by mouth 2 (two)  times daily. (Patient not taking: Reported on 02/19/2022), Disp: , Rfl:    Calcium Carbonate-Vit D-Min (CALCIUM 600+D PLUS MINERALS) 600-400 MG-UNIT TABS, Take 1 tablet by mouth 2 (two) times daily., Disp: , Rfl:    carvedilol (COREG) 6.25 MG tablet, Take by mouth 2 (two) times daily with a meal., Disp: , Rfl:    Cholecalciferol (VITAMIN D3) 250 MCG (10000 UT) capsule, Take 10,000 Units by mouth daily., Disp: , Rfl:    Cholecalciferol 125 MCG (5000 UT) TABS, Take by mouth., Disp: , Rfl:    Cinnamon 500 MG capsule, Take 500 mg by mouth daily., Disp: , Rfl:    Coenzyme Q10 200 MG capsule, Take 200 mg by mouth daily., Disp: , Rfl:    cyanocobalamin 100 MCG tablet, TAKE ONE TABLET BY MOUTH EVERY DAY TO PREVENT VITAMIN B12 DEFICIENCY (Patient not taking: Reported on 02/19/2022), Disp: , Rfl:    cyanocobalamin 1000 MCG tablet, Take 1,000 mcg by mouth daily., Disp: , Rfl:    ezetimibe (ZETIA) 10 MG tablet, Take 10 mg by mouth daily., Disp: , Rfl:    ferrous sulfate 325 (65 FE) MG tablet, Take 325 mg by mouth daily with breakfast., Disp: , Rfl:    fluticasone (FLONASE) 50 MCG/ACT nasal spray, Place 2 sprays into both nostrils daily as needed for rhinitis. (Patient not taking: Reported on 02/19/2022), Disp: , Rfl:  fluticasone (FLONASE) 50 MCG/ACT nasal spray, Place into the nose., Disp: , Rfl:    Glucosamine-Chondroit-Vit C-Mn (GLUCOSAMINE CHONDR 500 COMPLEX) CAPS, Take 2 capsules by mouth 2 (two) times daily., Disp: , Rfl:    lidocaine (LIDODERM) 5 %, Place onto the skin., Disp: , Rfl:    MAGNESIUM PO, Take by mouth., Disp: , Rfl:    niacin 500 MG CR capsule, Take 500 mg by mouth at bedtime. B3, Disp: , Rfl:    Potassium 99 MG TABS, Take 99 mg by mouth 2 (two) times daily., Disp: , Rfl:    potassium gluconate 595 (99 K) MG TABS tablet, Take by mouth., Disp: , Rfl:    pyridOXINE (B-6) 50 MG tablet, Take 100 mg by mouth daily., Disp: , Rfl:    pyridOXINE (B-6) 50 MG tablet, Take 2 tablets by mouth  daily. (Patient not taking: Reported on 02/19/2022), Disp: , Rfl:    RED YEAST RICE EXTRACT PO, Take 600 mg by mouth 2 (two) times daily., Disp: , Rfl:    riboflavin (VITAMIN B-2) 100 MG TABS tablet, Take 100 mg by mouth daily., Disp: , Rfl:    senna-docusate (SENOKOT-S) 8.6-50 MG tablet, Take 1 tablet by mouth 2 (two) times daily., Disp: , Rfl:    Skin Protectants, Misc. (HYDROCERIN EX), Apply 1 application topically daily as needed (dry skin)., Disp: , Rfl:    Skin Protectants, Misc. (MINERIN CREME EX), Apply topically., Disp: , Rfl:    spironolactone (ALDACTONE) 25 MG tablet, Take 12.5 mg by mouth daily. (Patient not taking: Reported on 02/19/2022), Disp: , Rfl:    spironolactone (ALDACTONE) 25 MG tablet, Take 0.5 tablets by mouth daily., Disp: , Rfl:    tamsulosin (FLOMAX) 0.4 MG CAPS capsule, Take 0.4 mg by mouth at bedtime. (Patient not taking: Reported on 02/19/2022), Disp: , Rfl:    tamsulosin (FLOMAX) 0.4 MG CAPS capsule, Take 1 capsule by mouth at bedtime., Disp: , Rfl:    thiamine 100 MG tablet, Take 100 mg by mouth daily., Disp: , Rfl:    traMADol (ULTRAM) 50 MG tablet, Take 50 mg by mouth every 6 (six) hours as needed for moderate pain., Disp: , Rfl:    trospium (SANCTURA) 20 MG tablet, Take 20 mg by mouth 2 (two) times daily., Disp: , Rfl:    warfarin (COUMADIN) 1 MG tablet, Take 3 mg by mouth at bedtime., Disp: , Rfl:    zinc gluconate 50 MG tablet, Take 50 mg by mouth daily., Disp: , Rfl:   Past Medical History: Past Medical History:  Diagnosis Date   Agent orange exposure    Atrial fibrillation ([place]) 2013   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   CHF (congestive heart failure) (HCC)    COPD (chronic obstructive pulmonary disease) (HCC)    Coronary artery disease    Diabetes mellitus without complication (HCC)    diet controlled   Dysrhythmia     A fib   Heart murmur    / 1 MD said yes, 1 said no   History of blood transfusion    during CABG   History of kidney stones     Hyperlipidemia    Hypertension    Left knee DJD    MRSA (methicillin resistant staph aureus) culture positive    Drainage from Left Knee approx 2005   Myocardial infarction ([place])    mild in 1998 per Wife    PONV (postoperative nausea and vomiting)    S/P coronary artery bypass graft x  2    1998   Shortness of breath    Sleep apnea    CPAP   Stented coronary artery     Tobacco Use: Social History   Tobacco Use  Smoking Status Never  Smokeless Tobacco Never  Tobacco Comments   father smoked in the home while pt was growing up//ex-wife smoked    Labs: Review Flowsheet       Latest Ref Rng & Units 03/28/2011 01/14/2012 02/25/2012 06/04/2020  Labs for ITP Cardiac and Pulmonary Rehab  Cholestrol 0 - 200 mg/dL 179  158  - -  LDL (calc) 0 - 99 mg/dL 124  110  - -  HDL-C >39.00 mg/dL 26.00  24.00  - -  Trlycerides 0.0 - 149.0 mg/dL 146.0  122.0  - -  Hemoglobin A1c 4.8 - 5.6 % - - 6.3  6.1      Exercise Target Goals: Exercise Program Goal: Individual exercise prescription set using results from initial 6 min walk test and THRR while considering  patient's activity barriers and safety.   Exercise Prescription Goal: Initial exercise prescription builds to 30-45 minutes a day of aerobic activity, 2-3 days per week.  Home exercise guidelines will be given to patient during program as part of exercise prescription that the participant will acknowledge.   Education: Aerobic Exercise: - Group verbal and visual presentation on the components of exercise prescription. Introduces F.I.T.T principle from ACSM for exercise prescriptions.  Reviews F.I.T.T. principles of aerobic exercise including progression. Written material given at graduation. Flowsheet Row Cardiac Rehab from 04/30/2022 in [facility] Cardiac and Pulmonary Rehab  Education need identified 03/03/22       Education: Resistance Exercise: - Group verbal and visual presentation on the components of exercise prescription.  Introduces F.I.T.T principle from ACSM for exercise prescriptions  Reviews F.I.T.T. principles of resistance exercise including progression. Written material given at graduation.    Education: Exercise & Equipment Safety: - Individual verbal instruction and demonstration of equipment use and safety with use of the equipment. Flowsheet Row Cardiac Rehab from 04/30/2022 in [facility] Cardiac and Pulmonary Rehab  Date 02/19/22  Educator [facility]  Instruction Review Code 1- Verbalizes Understanding       Education: Exercise Physiology & General Exercise Guidelines: - Group verbal and written instruction with models to review the exercise physiology of the cardiovascular system and associated critical values. Provides general exercise guidelines with specific guidelines to those with heart or lung disease.    Education: Flexibility, Balance, Mind/Body Relaxation: - Group verbal and visual presentation with interactive activity on the components of exercise prescription. Introduces F.I.T.T principle from ACSM for exercise prescriptions. Reviews F.I.T.T. principles of flexibility and balance exercise training including progression. Also discusses the mind body connection.  Reviews various relaxation techniques to help reduce and manage stress (i.e. Deep breathing, progressive muscle relaxation, and visualization). Balance handout provided to take home. Written material given at graduation.   Activity Barriers & Risk Stratification:  Activity Barriers & Cardiac Risk Stratification - 03/03/22 1507       Activity Barriers & Cardiac Risk Stratification   Activity Barriers Left Knee Replacement;Arthritis;Deconditioning;Muscular Weakness;Shortness of Breath   arthritis in feet   Cardiac Risk Stratification Moderate             6 Minute Walk:  6 Minute Walk     Row Name 03/03/22 1507         6 Minute Walk   Phase Initial     Distance 1040 feet  Walk Time 6 minutes     # of Rest Breaks 0      MPH 197     METS 1.12     RPE 11     Perceived Dyspnea  2     VO2 Peak 5.31     Symptoms Yes (comment)     Comments SOB     Resting HR 61 bpm     Resting BP 128/64     Resting Oxygen Saturation  96 %     Exercise Oxygen Saturation  during 6 min walk 97 %     Max Ex. HR 105 bpm     Max Ex. BP 136/74     2 Minute Post BP 122/60              Oxygen Initial Assessment:   Oxygen Re-Evaluation:   Oxygen Discharge (Final Oxygen Re-Evaluation):   Initial Exercise Prescription:  Initial Exercise Prescription - 03/03/22 1500       Date of Initial Exercise RX and Referring Provider   Date 03/03/22    Referring Provider Ruther[place], Vicente Males MD ([place])      Oxygen   Maintain Oxygen Saturation 88% or higher      Recumbant Bike   Level 1    RPM 50    Watts 5    Minutes 15    METs 2      NuStep   Level 1    SPM 80    Minutes 15    METs 2      T5 Nustep   Level 1    SPM 80    Minutes 15    METs 2      Biostep-RELP   Level 1    SPM 50    Minutes 15    METs 2      Track   Laps 27    Minutes 15    METs 2.47      Prescription Details   Frequency (times per week) 3    Duration Progress to 30 minutes of continuous aerobic without signs/symptoms of physical distress      Intensity   THRR 40-80% of Max Heartrate 92-123    Ratings of Perceived Exertion 11-13    Perceived Dyspnea 0-4      Progression   Progression Continue to progress workloads to maintain intensity without signs/symptoms of physical distress.      Resistance Training   Training Prescription Yes    Weight 5 lb    Reps 10-15             Perform Capillary Blood Glucose checks as needed.  Exercise Prescription Changes:   Exercise Prescription Changes     Row Name 03/03/22 1500 03/18/22 1500 04/02/22 1100 04/15/22 1500 05/01/22 1400     Response to Exercise   Blood Pressure (Admit) 128/64 128/72 124/62 126/62 128/70   Blood Pressure (Exercise) 136/74 150/88 142/68 134/74 152/84    Blood Pressure (Exit) 122/60 122/62 116/74 122/62 118/78   Heart Rate (Admit) 61 bpm 67 bpm 70 bpm 81 bpm 64 bpm   Heart Rate (Exercise) 105 bpm 103 bpm 103 bpm 104 bpm 102 bpm   Heart Rate (Exit) 75 bpm 66 bpm 76 bpm 80 bpm 85 bpm   Oxygen Saturation (Admit) 96 % -- -- -- --   Oxygen Saturation (Exercise) 97 % -- -- -- --   Rating of Perceived Exertion (Exercise) 11 13 12 12 12   $ Perceived Dyspnea (Exercise)  2 -- -- -- --   Symptoms SOB none none none none   Comments walk test results 3rd full day of exercise -- -- --   Duration -- Progress to 30 minutes of  aerobic without signs/symptoms of physical distress Progress to 30 minutes of  aerobic without signs/symptoms of physical distress Continue with 30 min of aerobic exercise without signs/symptoms of physical distress. Continue with 30 min of aerobic exercise without signs/symptoms of physical distress.   Intensity -- THRR unchanged THRR unchanged THRR unchanged THRR unchanged     Progression   Progression -- Continue to progress workloads to maintain intensity without signs/symptoms of physical distress. Continue to progress workloads to maintain intensity without signs/symptoms of physical distress. Continue to progress workloads to maintain intensity without signs/symptoms of physical distress. Continue to progress workloads to maintain intensity without signs/symptoms of physical distress.   Average METs -- 2.25 2.58 2.7 2.32     Resistance Training   Training Prescription -- Yes Yes Yes Yes   Weight -- 5 lb 5 lb 2 lb 2 lb   Reps -- 10-15 10-15 10-15 10-15     Interval Training   Interval Training -- No No No No     Recumbant Bike   Level -- 3 -- -- --   Minutes -- 15 -- -- --   METs -- 2 -- -- --     NuStep   Level -- 2 4 -- 4   Minutes -- 30 30 -- 30   METs -- 2 3 -- 3.3     T5 Nustep   Level -- 2 -- 4 --   Minutes -- 15 -- 30 --   METs -- -- -- 3 --     Biostep-RELP   Level -- 3 3 2 3   $ Minutes -- 15 30 30 30    $ METs -- 3 2 2 2     $ Oxygen   Maintain Oxygen Saturation -- 88% or higher 88% or higher 88% or higher 88% or higher    Row Name 05/13/22 1500             Response to Exercise   Blood Pressure (Admit) 128/60       Blood Pressure (Exit) 112/64       Heart Rate (Admit) 61 bpm       Heart Rate (Exercise) 89 bpm       Heart Rate (Exit) 71 bpm       Rating of Perceived Exertion (Exercise) 12       Symptoms none       Duration Continue with 30 min of aerobic exercise without signs/symptoms of physical distress.       Intensity THRR unchanged         Progression   Progression Continue to progress workloads to maintain intensity without signs/symptoms of physical distress.       Average METs 2.43         Resistance Training   Training Prescription Yes       Weight 4 lb       Reps 10-15         Interval Training   Interval Training No         NuStep   Level 2       Minutes 15         REL-XR   Level 3       Minutes 15       METs 2  Biostep-RELP   Level 2       Minutes 15       METs 3         Track   Laps 15       Minutes 15       METs 1.82         Oxygen   Maintain Oxygen Saturation 88% or higher                Exercise Comments:   Exercise Comments     Row Name 03/10/22 1038           Exercise Comments First full day of exercise!  Patient was oriented to gym and equipment including functions, settings, policies, and procedures.  Patient's individual exercise prescription and treatment plan were reviewed.  All starting workloads were established based on the results of the 6 minute walk test done at initial orientation visit.  The plan for exercise progression was also introduced and progression will be customized based on patient's performance and goals.                Exercise Goals and Review:   Exercise Goals     Row Name 03/03/22 1512             Exercise Goals   Increase Physical Activity Yes       Intervention Provide  advice, education, support and counseling about physical activity/exercise needs.;Develop an individualized exercise prescription for aerobic and resistive training based on initial evaluation findings, risk stratification, comorbidities and participant's personal goals.       Expected Outcomes Short Term: Attend rehab on a regular basis to increase amount of physical activity.;Long Term: Add in home exercise to make exercise part of routine and to increase amount of physical activity.;Long Term: Exercising regularly at least 3-5 days a week.       Increase Strength and Stamina Yes       Intervention Provide advice, education, support and counseling about physical activity/exercise needs.;Develop an individualized exercise prescription for aerobic and resistive training based on initial evaluation findings, risk stratification, comorbidities and participant's personal goals.       Expected Outcomes Short Term: Increase workloads from initial exercise prescription for resistance, speed, and METs.;Short Term: Perform resistance training exercises routinely during rehab and add in resistance training at home;Long Term: Improve cardiorespiratory fitness, muscular endurance and strength as measured by increased METs and functional capacity (6MWT)       Able to understand and use rate of perceived exertion (RPE) scale Yes       Intervention Provide education and explanation on how to use RPE scale       Expected Outcomes Short Term: Able to use RPE daily in rehab to express subjective intensity level;Long Term:  Able to use RPE to guide intensity level when exercising independently       Able to understand and use Dyspnea scale Yes       Intervention Provide education and explanation on how to use Dyspnea scale       Expected Outcomes Short Term: Able to use Dyspnea scale daily in rehab to express subjective sense of shortness of breath during exertion;Long Term: Able to use Dyspnea scale to guide intensity  level when exercising independently       Knowledge and understanding of Target Heart Rate Range (THRR) Yes       Intervention Provide education and explanation of THRR including how the numbers were predicted and where they  are located for reference       Expected Outcomes Short Term: Able to state/look up THRR;Short Term: Able to use daily as guideline for intensity in rehab;Long Term: Able to use THRR to govern intensity when exercising independently       Able to check pulse independently Yes       Intervention Provide education and demonstration on how to check pulse in carotid and radial arteries.;Review the importance of being able to check your own pulse for safety during independent exercise       Expected Outcomes Short Term: Able to explain why pulse checking is important during independent exercise;Long Term: Able to check pulse independently and accurately       Understanding of Exercise Prescription Yes       Intervention Provide education, explanation, and written materials on patient's individual exercise prescription       Expected Outcomes Short Term: Able to explain program exercise prescription;Long Term: Able to explain home exercise prescription to exercise independently                Exercise Goals Re-Evaluation :  Exercise Goals Re-Evaluation     Row Name 03/10/22 1038 03/18/22 1529 03/24/22 0926 04/02/22 1139 04/15/22 1537     Exercise Goal Re-Evaluation   Exercise Goals Review Able to understand and use rate of perceived exertion (RPE) scale;Able to understand and use Dyspnea scale;Knowledge and understanding of Target Heart Rate Range (THRR);Understanding of Exercise Prescription Increase Physical Activity;Increase Strength and Stamina;Understanding of Exercise Prescription Increase Physical Activity;Increase Strength and Stamina;Understanding of Exercise Prescription -- Increase Physical Activity;Increase Strength and Stamina;Understanding of Exercise Prescription    Comments Reviewed RPE scale, THR and program prescription with pt today.  Pt voiced understanding and was given a copy of goals to take home. Chase Keller is off to a good start with rehab for the first couple of sessions he has been here. He has already increased on all of his seated machines between level 2-3. Patient was not willing to try walking on the track yet, but will keep encouraging patient until he feels ready. Will continue to monitor as he progresses in the program. Chase Keller is tolerating his exercise well. He has been coming consistently to cardiac rehab. He has made some increases in his workloads since starting the program. He does have a bad foot and is working with the [place] to have that checked before he starts walking in the program. Chase Keller is doing well in rehab. He recently improved his overall average MET level to 2.58 METs. He also was able to improve to level 4 on the T4. He has been doing his seated machines for 30 minutes and has not done any walking during his rehab sessions. We will encourage Chase Keller that improving his walking is an important part of the program. We will continue to monitor his progress in the program. Chase Keller continues to do well considering what he is limited with. He did increase to level 4 on the T4 Nustep. Even though he is unable to walk at this time, he has been hitting his THR each session. We will continue to follow up on progress.   Expected Outcomes Short: Use RPE daily to regulate intensity. Long: Follow program prescription in THR. Short: Incorporate couple laps of walking on track when ready Long: Continue to increase overall MET level Short: Incorporate couple laps of walking on track when ready Long: Continue to increase overall MET level Short: Begin walking on track. Long: Continue to increase  strength and stamina. Short: Continue to increase seated machines Long: Continue to increase overall MET level    Row Name 04/18/22 0926 05/01/22 1408 05/07/22 0950 05/13/22 1504        Exercise Goal Re-Evaluation   Exercise Goals Review Increase Physical Activity;Increase Strength and Stamina;Understanding of Exercise Prescription;Able to understand and use rate of perceived exertion (RPE) scale;Knowledge and understanding of Target Heart Rate Range (THRR);Able to understand and use Dyspnea scale;Able to check pulse independently Increase Physical Activity;Increase Strength and Stamina;Understanding of Exercise Prescription Increase Physical Activity;Increase Strength and Stamina;Understanding of Exercise Prescription Increase Physical Activity;Increase Strength and Stamina;Understanding of Exercise Prescription    Comments Chase Keller is doing well in rehab.  He is already walking some at home. Reviewed home exercise with pt today.  Pt plans to walking and weights at home for exercise.  Reviewed THR, pulse, RPE, sign and symptoms, pulse oximetery and when to call 911 or MD.  Also discussed weather considerations and indoor options.  Pt voiced understanding. Chase Keller is doing well in rehab. He has continued to work on the T4 and Biostep for 30 minutes of exercise. He has not started walking during his time in rehab but we will continue to encourage him to begin walking. We will continue to monitor his progress in the program. Chase Keller is doing well in rehab.  He is staying busy at home.  He has had several funerals and weddings recently.  He was able to walk some today.  He has noticed that his stamina has improved some since starting rehab. Chase Keller continues to do well in rehab. He started to walk at his sessions and was able to walk 15 laps on the track for 2 sessions! He tried out the XR for the first time and was able to work at level 3. He is now using 4 lbs for handweights. We will continue to monitor    Expected Outcomes Short: Start to add in exercise at home consistently Long: Continue to improve stamina Short: Begin walking some while in rehab. Long: Continue to improve strength and stamina.  short: continue to add in more walking Long: continue to improve stamina Short: Slowly increase more laps on track, strive for 20 Long: Continue to increase overall MET level and stamina             Discharge Exercise Prescription (Final Exercise Prescription Changes):  Exercise Prescription Changes - 05/13/22 1500       Response to Exercise   Blood Pressure (Admit) 128/60    Blood Pressure (Exit) 112/64    Heart Rate (Admit) 61 bpm    Heart Rate (Exercise) 89 bpm    Heart Rate (Exit) 71 bpm    Rating of Perceived Exertion (Exercise) 12    Symptoms none    Duration Continue with 30 min of aerobic exercise without signs/symptoms of physical distress.    Intensity THRR unchanged      Progression   Progression Continue to progress workloads to maintain intensity without signs/symptoms of physical distress.    Average METs 2.43      Resistance Training   Training Prescription Yes    Weight 4 lb    Reps 10-15      Interval Training   Interval Training No      NuStep   Level 2    Minutes 15      REL-XR   Level 3    Minutes 15    METs 2      Biostep-RELP  Level 2    Minutes 15    METs 3      Track   Laps 15    Minutes 15    METs 1.82      Oxygen   Maintain Oxygen Saturation 88% or higher             Nutrition:  Target Goals: Understanding of nutrition guidelines, daily intake of sodium <1526m, cholesterol <2089m calories 30% from fat and 7% or less from saturated fats, daily to have 5 or more servings of fruits and vegetables.  Education: All About Nutrition: -Group instruction provided by verbal, written material, interactive activities, discussions, models, and posters to present general guidelines for heart healthy nutrition including fat, fiber, MyPlate, the role of sodium in heart healthy nutrition, utilization of the nutrition label, and utilization of this knowledge for meal planning. Follow up email sent as well. Written material given at  graduation. [facility]  Education need identified 03/03/22       Biometrics:  Pre Biometrics - 03/03/22 1513       Pre Biometrics   Height 5' 5.9" (1.674 m)    Weight 213 lb 4.8 oz (96.8 kg)    Waist Circumference 44.75 inches    Hip Circumference 41.5 inches    Waist to Hip Ratio 1.08 %    BMI (Calculated) 34.53    Single Leg Stand 4.9 seconds              Nutrition Therapy Plan and Nutrition Goals:  Nutrition Therapy & Goals - 03/03/22 1042       Nutrition Therapy   Diet Heart healthy, low Na, T2DM    Protein (specify units) 85-95g    Fiber 28 grams    Whole Grain Foods 3 servings    Saturated Fats 16 max. grams    Fruits and Vegetables 8 servings/day    Sodium 2 grams      Personal Nutrition Goals   Nutrition Goal ST: LT: limit sodium <2g/day, limit saturated fat <16g, follow MyPlate guidelines    Comments [age].o. M admitted to cardiac rehab s/p stent placement.PMHx includes HTN, CAD, CHF, a.fib, OSA, T2DM, hypothyroidism. Relevant medications includes apple cider vinegar pill, calcium, vitamin D3, CoQ10, B-12, ferrous sulfate, vitamin C, cinnamon, magnesium, potassium, red yeast extract, B-6, B-2, thiamine, tramadol, zinc, senokot, acai berry PO, alfalfa PO, MSM. Pt reports taking many supplements for a long time; discussed how many can be unnecessary, it is important to get our nutrients from food first when we can, supplements should be third party tested, you can exceed recommended doeses especially with fat soluble vitamins such as vitamin D, and to check with your health care provider before starting any supplements as they can also interact with medications and medical conditions, pt voiced understanding. B: scrambled eggs or grits or oatmeal L: vegetable plate at restaurant (macoroni salad, green beans, peaches). He does not have raw red tomato as that bothers his gums. He limits bread and tries  to not eat it. D: [employer] chicken on tuesday, vegetables when he goes out to eat. He also limits his dessert. He reports cooking about 2x/week. ToShantieports he would like to lose weight around his midsection - discussed how we cannot spot-reduce fat, aging can cause natural body composition changes, and some behavior changes that can help such as including fiber, reducing excess fat and added sugar, and honoring hunger. Reviewed heart healthy nutrition  and diabetes guidelines. Discussed how going out to eat can make it hard to include whole grains, include variety, limit saturated fat, and limit sodium. Discussed some tips on how to eat more heart healthy options when out to eat such as including more non-starchy vegetables, choosing more lean proteins such as chicken, limiting red meat, limiting creamy sauces/dressings and limiting fried or breaded foods.      Intervention Plan   Intervention Prescribe, educate and counsel regarding individualized specific dietary modifications aiming towards targeted core components such as weight, hypertension, lipid management, diabetes, heart failure and other comorbidities.;Nutrition handout(s) given to patient.    Expected Outcomes Short Term Goal: Understand basic principles of dietary content, such as calories, fat, sodium, cholesterol and nutrients.;Short Term Goal: A plan has been developed with personal nutrition goals set during dietitian appointment.;Long Term Goal: Adherence to prescribed nutrition plan.             Nutrition Assessments:  MEDIFICTS Score Key: ?70 Need to make dietary changes  40-70 Heart Healthy Diet ? 40 Therapeutic Level Cholesterol Diet  Flowsheet Row Cardiac Rehab from 03/03/2022 in [facility] Cardiac and Pulmonary Rehab  Picture Your Plate Total Score on Admission 69      Picture Your Plate Scores: [id number] Unhealthy dietary pattern with much room for improvement. 41-50 Dietary pattern unlikely to meet recommendations for  good health and room for improvement. 51-60 More healthful dietary pattern, with some room for improvement.  >60 Healthy dietary pattern, although there may be some specific behaviors that could be improved.    Nutrition Goals Re-Evaluation:  Nutrition Goals Re-Evaluation     [place] Name 03/24/22 0931 04/18/22 0930 05/07/22 0954         Goals   Nutrition Goal ST: LT: limit sodium <2g/day, limit saturated fat <16g, follow MyPlate guidelines Short: continue to to limit sodium and fat. Long: continue to make heart healthy dietary choices to help control risk factors. Short: Continue to work on portion sizes Long: conitnue to focus on heart healthy eating     Comment Patient reports that he has been trying to cut back on sodium an fat. He now eats his seafood broiled instead of fried. He has also been removing skin from chicken. Chase Keller is doing well with his diet.  He has cut back on bread and sodium in general.  His weakness is yellow tomato sandwiches when he can find them.  He is trying to eat more fruits and vegetables.  His goal is to eat healthier in general.  He continues to work on his portions and cutting back.  He usually will order a veggie plate now when they go out to eat. Chase Keller is doing well in rehab.  He continues to work on his diet.  He is making the best of it.  He has been busy recently, with funerals and not eating set meals.  He is still sticking to veggie plates.  He has not cut back on portion sizes since he has been limited in his meals.  He is getting it all in one versus three meals.  He does keep grapes in the fridge to grad and go.     Expected Outcome Short: continue to to limit sodium and fat. Long: continue to make heart healthy dietary choices to help control risk factors. Short:  Continue to work on portion sizes Long: conitnue to focus on heart healthy eating Short: Try to eat throughout day versus skipping meals Long: Conitnue to work on healthier  eating               Nutrition Goals Discharge (Final Nutrition Goals Re-Evaluation):  Nutrition Goals Re-Evaluation - 05/07/22 0954       Goals   Nutrition Goal Short: Continue to work on portion sizes Long: conitnue to focus on heart healthy eating    Comment Dyllan is doing well in rehab.  He continues to work on his diet.  He is making the best of it.  He has been busy recently, with funerals and not eating set meals.  He is still sticking to veggie plates.  He has not cut back on portion sizes since he has been limited in his meals.  He is getting it all in one versus three meals.  He does keep grapes in the fridge to grad and go.    Expected Outcome Short: Try to eat throughout day versus skipping meals Long: Conitnue to work on healthier eating             Psychosocial: Target Goals: Acknowledge presence or absence of significant depression and/or stress, maximize coping skills, provide positive support system. Participant is able to verbalize types and ability to use techniques and skills needed for reducing stress and depression.   Education: Stress, Anxiety, and Depression - Group verbal and visual presentation to define topics covered.  Reviews how body is impacted by stress, anxiety, and depression.  Also discusses healthy ways to reduce stress and to treat/manage anxiety and depression.  Written material given at graduation. Flowsheet Row Cardiac Rehab from 11/30/2019 in [facility] Cardiac and Pulmonary Rehab  Date 11/09/19  Educator SB  Instruction Review Code 1- [employer] Understanding       Education: Sleep Hygiene -Provides group verbal and written instruction about how sleep can affect your health.  Define sleep hygiene, discuss sleep cycles and impact of sleep habits. Review good sleep hygiene tips.    Initial Review & Psychosocial Screening:  Initial Psych Review & Screening - 02/19/22 1437       Initial Review   Current issues with None Identified      Family Dynamics   Good  Support System? Yes    Comments His wife is his support system. He has done the program before and is ready to get healthier.      Barriers   Psychosocial barriers to participate in program There are no identifiable barriers or psychosocial needs.;The patient should benefit from training in stress management and relaxation.      Screening Interventions   Interventions Encouraged to exercise;To provide support and resources with identified psychosocial needs;Provide feedback about the scores to participant    Expected Outcomes Short Term goal: Utilizing psychosocial counselor, staff and physician to assist with identification of specific Stressors or current issues interfering with healing process. Setting desired goal for each stressor or current issue identified.;Short Term goal: Identification and review with participant of any Quality of Life or Depression concerns found by scoring the questionnaire.;Long Term Goal: Stressors or current issues are controlled or eliminated.;Long Term goal: The participant improves quality of Life and PHQ9 Scores as seen by post scores and/or verbalization of changes             Quality of Life Scores:   Quality of Life - 03/03/22 1513       Quality of Life   Select Quality of Life      Quality of Life Scores   Health/Function Pre 17.5 %    Socioeconomic Pre 19.71 %  Psych/Spiritual Pre 22.79 %    Family Pre 18 %    GLOBAL Pre 19.12 %            Scores of 19 and below usually indicate a poorer quality of life in these areas.  A difference of  2-3 points is a clinically meaningful difference.  A difference of 2-3 points in the total score of the Quality of Life Index has been associated with significant improvement in overall quality of life, self-image, physical symptoms, and general health in studies assessing change in quality of life.  PHQ-9: Review Flowsheet       03/03/2022 10/27/2019 02/26/2012  Depression screen PHQ 2/9  Decreased  Interest 0 0 0  Down, Depressed, Hopeless 0 0 0  PHQ - 2 Score 0 0 0  Altered sleeping 0 0 -  Tired, decreased energy 0 0 -  Change in appetite 0 0 -  Feeling bad or failure about yourself  0 0 -  Trouble concentrating 0 0 -  Moving slowly or fidgety/restless 0 0 -  Suicidal thoughts 0 0 -  PHQ-9 Score 0 0 -  Difficult doing work/chores Not difficult at all Not difficult at all -   Interpretation of Total Score  Total Score Depression Severity:  1-4 = Minimal depression, 5-9 = Mild depression, 10-14 = Moderate depression, 15-19 = Moderately severe depression, 20-27 = Severe depression   Psychosocial Evaluation and Intervention:  Psychosocial Evaluation - 02/19/22 1438       Psychosocial Evaluation & Interventions   Interventions Stress management education;Relaxation education;Encouraged to exercise with the program and follow exercise prescription    Comments His wife is his support system. He has done the program before and is ready to get healthier.    Expected Outcomes Short: attend cardiac rehab for exercise and education. Long: develop positive self care habits.    Continue Psychosocial Services  Follow up required by staff             Psychosocial Re-Evaluation:  Psychosocial Re-Evaluation     [place] Name 03/24/22 [phone] 04/18/22 0926 05/07/22 0951         Psychosocial Re-Evaluation   Current issues with None Identified Current Stress Concerns Current Stress Concerns     Comments Patient reports no new mental health or sleep concerns. Chase Keller is doing well in rehab.  He had a traffic violation and the lawyer was able to get it lowered penalty.  He just has pay the fines and court fees. He is doing well mentally. He is doing well sleeping on his BiPap and is compliant with using it nightly. Chase Keller is doing well in rehab.  He has had several funerals to attend recently, which is hard on him.  He is still sleeping pretty good for most part.  He usually is a night owl and stays up  until 2am as he used to work second shift and it has just became a habit.  He is doing his best to stay positive with losing his friends, but he is making the best of it.     Expected Outcomes Short: continue to attend cardiac rehab and exercise consistently to benefit mental health. Long: mantain good mental health habits. Short: continue to focus on positive Long: continued compliance with BiPap Short: Continue to sleep when he can Long: Continue to focus on the good.     Interventions Encouraged to attend Cardiac Rehabilitation for the exercise Encouraged to attend Cardiac Rehabilitation for the exercise Encouraged to  attend Cardiac Rehabilitation for the exercise     Continue Psychosocial Services  Follow up required by staff Follow up required by staff Follow up required by staff              Psychosocial Discharge (Final Psychosocial Re-Evaluation):  Psychosocial Re-Evaluation - 05/07/22 0951       Psychosocial Re-Evaluation   Current issues with Current Stress Concerns    Comments Chase Keller is doing well in rehab.  He has had several funerals to attend recently, which is hard on him.  He is still sleeping pretty good for most part.  He usually is a night owl and stays up until 2am as he used to work second shift and it has just became a habit.  He is doing his best to stay positive with losing his friends, but he is making the best of it.    Expected Outcomes Short: Continue to sleep when he can Long: Continue to focus on the good.    Interventions Encouraged to attend Cardiac Rehabilitation for the exercise    Continue Psychosocial Services  Follow up required by staff             Vocational Rehabilitation: Provide vocational rehab assistance to qualifying candidates.   Vocational Rehab Evaluation & Intervention:   Education: Education Goals: Education classes will be provided on a variety of topics geared toward better understanding of heart health and risk factor modification.  Participant will state understanding/return demonstration of topics presented as noted by education test scores.  Learning Barriers/Preferences:  Learning Barriers/Preferences - 02/19/22 1436       Learning Barriers/Preferences   Learning Barriers None    Learning Preferences None             General Cardiac Education Topics:  AED/CPR: - Group verbal and written instruction with the use of models to demonstrate the basic use of the AED with the basic ABC's of resuscitation.   Anatomy and Cardiac Procedures: - Group verbal and visual presentation and models provide information about basic cardiac anatomy and function. Reviews the testing methods done to diagnose heart disease and the outcomes of the test results. Describes the treatment choices: Medical Management, Angioplasty, or Coronary Bypass Surgery for treating various heart conditions including Myocardial Infarction, Angina, Valve Disease, and Cardiac Arrhythmias.  Written material given at graduation. Flowsheet Row Cardiac Rehab from 04/30/2022 in [facility] Cardiac and Pulmonary Rehab  Date 03/12/22  Educator [facility]  Instruction Review Code 1- Verbalizes Understanding       Medication Safety: - Group verbal and visual instruction to review commonly prescribed medications for heart and lung disease. Reviews the medication, class of the drug, and side effects. Includes the steps to properly store meds and maintain the prescription regimen.  Written material given at graduation.   Intimacy: - Group verbal instruction through game format to discuss how heart and lung disease can affect sexual intimacy. Written material given at graduation..   Know Your Numbers and Heart Failure: - Group verbal and visual instruction to discuss disease risk factors for cardiac and pulmonary disease and treatment options.  Reviews associated critical values for Overweight/Obesity, Hypertension, Cholesterol, and Diabetes.  Discusses basics of heart  failure: signs/symptoms and treatments.  Introduces Heart Failure Zone chart for action plan for heart failure.  Written material given at graduation. Flowsheet Row Cardiac Rehab from 04/30/2022 in [facility] Cardiac and Pulmonary Rehab  Education need identified 03/03/22       Infection Prevention: - Provides verbal and  written material to individual with discussion of infection control including proper hand washing and proper equipment cleaning during exercise session. Flowsheet Row Cardiac Rehab from 04/30/2022 in [facility] Cardiac and Pulmonary Rehab  Date 02/19/22  Educator [facility]  Instruction Review Code 1- Verbalizes Understanding       Falls Prevention: - Provides verbal and written material to individual with discussion of falls prevention and safety. Flowsheet Row Cardiac Rehab from 04/30/2022 in [facility] Cardiac and Pulmonary Rehab  Date 02/19/22  Educator [facility]  Instruction Review Code 1- Verbalizes Understanding       Other: -Provides group and verbal instruction on various topics (see comments)   Knowledge Questionnaire Score:  Knowledge Questionnaire Score - 03/03/22 1514       Knowledge Questionnaire Score   Pre Score 22/26             Core Components/Risk Factors/Patient Goals at Admission:  Personal Goals and Risk Factors at Admission - 03/03/22 1515       Core Components/Risk Factors/Patient Goals on Admission    Weight Management Yes;Weight Loss;Obesity    Intervention Weight Management: Develop a combined nutrition and exercise program designed to reach desired caloric intake, while maintaining appropriate intake of nutrient and fiber, sodium and fats, and appropriate energy expenditure required for the weight goal.;Weight Management: Provide education and appropriate resources to help participant work on and attain dietary goals.;Weight Management/Obesity: Establish reasonable short term and long term weight goals.;Obesity: Provide education and appropriate resources to  help participant work on and attain dietary goals.    Admit Weight 213 lb 4.8 oz (96.8 kg)    Goal Weight: Short Term 210 lb (95.3 kg)    Goal Weight: Long Term 205 lb (93 kg)    Expected Outcomes Short Term: Continue to assess and modify interventions until short term weight is achieved;Long Term: Adherence to nutrition and physical activity/exercise program aimed toward attainment of established weight goal;Weight Loss: Understanding of general recommendations for a balanced deficit meal plan, which promotes 1-2 lb weight loss per week and includes a negative energy balance of [id number] kcal/d;Understanding recommendations for meals to include 15-35% energy as protein, 25-35% energy from fat, 35-60% energy from carbohydrates, less than 259m of dietary cholesterol, 20-35 gm of total fiber daily;Understanding of distribution of calorie intake throughout the day with the consumption of 4-5 meals/snacks    Improve shortness of breath with ADL's Yes    Intervention Provide education, individualized exercise plan and daily activity instruction to help decrease symptoms of SOB with activities of daily living.    Expected Outcomes Short Term: Improve cardiorespiratory fitness to achieve a reduction of symptoms when performing ADLs;Long Term: Be able to perform more ADLs without symptoms or delay the onset of symptoms    Diabetes Yes    Intervention Provide education about signs/symptoms and action to take for hypo/hyperglycemia.;Provide education about proper nutrition, including hydration, and aerobic/resistive exercise prescription along with prescribed medications to achieve blood glucose in normal ranges: Fasting glucose 65-99 mg/dL    Expected Outcomes Short Term: Participant verbalizes understanding of the signs/symptoms and immediate care of hyper/hypoglycemia, proper foot care and importance of medication, aerobic/resistive exercise and nutrition plan for blood glucose control.;Long Term: Attainment of  HbA1C < 7%.    Hypertension Yes    Intervention Provide education on lifestyle modifcations including regular physical activity/exercise, weight management, moderate sodium restriction and increased consumption of fresh fruit, vegetables, and low fat dairy, alcohol moderation, and smoking cessation.;Monitor prescription use compliance.    Expected  Outcomes Short Term: Continued assessment and intervention until BP is < 140/73m HG in hypertensive participants. < 130/860mHG in hypertensive participants with diabetes, heart failure or chronic kidney disease.;Long Term: Maintenance of blood pressure at goal levels.    Lipids Yes    Intervention Provide education and support for participant on nutrition & aerobic/resistive exercise along with prescribed medications to achieve LDL <7014mHDL >69m25m  Expected Outcomes Short Term: Participant states understanding of desired cholesterol values and is compliant with medications prescribed. Participant is following exercise prescription and nutrition guidelines.;Long Term: Cholesterol controlled with medications as prescribed, with individualized exercise RX and with personalized nutrition plan. Value goals: LDL < 70mg41mL > 40 mg.             Education:Diabetes - Individual verbal and written instruction to review signs/symptoms of diabetes, desired ranges of glucose level fasting, after meals and with exercise. Acknowledge that pre and post exercise glucose checks will be done for 3 sessions at entry of program. [facility]  Date 03/03/22  Educ[facility] Review Code 1- Verbalizes Understanding       Core Components/Risk Factors/Patient Goals Review:   Goals and Risk Factor Review     [place] Name 03/24/22 0934 04/18/22 0932 05/07/22 0957         Core Components/Risk Factors/Patient Goals Review   Personal Goals Review Weight Management/Obesity;Hypertension;Improve  shortness of breath with ADL's;Diabetes;Lipids Weight Management/Obesity;Hypertension;Improve shortness of breath with ADL's;Diabetes;Lipids Weight Management/Obesity;Hypertension;Improve shortness of breath with ADL's;Diabetes;Lipids     Review Patient reports that he monitors his blood sugar and blood presure at home. He takes all meds as prescribed. Diabetes is still diet controlled and he reports that his blood sugar numbers are good. Niguel Diyarioing well in rehab.  His weight is staying steady and he is feeling good.  His pressures are doing well and he checks on occassion at home.  His sugars vary between 105-115 each week.  He usually checks both on Monday mornings.  His wife is also diabetic so they both watch what they are eating.  He is breathing a little better overall.  He has found that he is only getting short of breath when he works harder. Zeno Gerrelloing well in rehab.  His sugars are doing well and staying balanced.  His wife continues to struggle with blood sugar control so he is grateful that his are good.  His pressures are doing well usuially 120s/60s when he checks them at home.  His weight is staying steady and he would like to lost some more.     Expected Outcomes Short: continue to take all meds and continue to monitor blood sugars and blood pressures at home. Long: continue to work on weight loss goal. Short: Conitnue to keep close eye on sugars Long: conitnue to monitor risk factors Short: Continue to work on weight loss Long: Conitnue to monitor risk factors and manage diabetes              Core Components/Risk Factors/Patient Goals at Discharge (Final Review):   Goals and Risk Factor Review - 05/07/22 0957       Core Components/Risk Factors/Patient Goals Review   Personal Goals Review Weight Management/Obesity;Hypertension;Improve shortness of breath with ADL's;Diabetes;Lipids    Review Chase Keller [place] well in rehab.  His sugars are doing well and staying balanced.  His wife  continues to struggle with blood sugar control so he  is grateful that his are good.  His pressures are doing well usuially 120s/60s when he checks them at home.  His weight is staying steady and he would like to lost some more.    Expected Outcomes Short: Continue to work on weight loss Long: Conitnue to monitor risk factors and manage diabetes             ITP Comments:  ITP Comments     Row Name 02/19/22 1440 03/03/22 1507 03/05/22 0836 03/10/22 1038 04/02/22 1100   ITP Comments Virtual Visit completed. Patient informed on EP and RD appointment and 6 Minute walk test. Patient also informed of patient health questionnaires on My Chart. Patient Verbalizes understanding. Visit diagnosis can be found in [facility] 02/02/[facility]22. Completed 6MWT and gym orientation. Initial ITP created and sent for review to Dr. Emily Filbert, Medical Director. 30 Day review completed. Medical Director ITP review done, changes made as directed, and signed approval by Medical Director.   new to program First full day of exercise!  Patient was oriented to gym and equipment including functions, settings, policies, and procedures.  Patient's individual exercise prescription and treatment plan were reviewed.  All starting workloads were established based on the results of the 6 minute walk test done at initial orientation visit.  The plan for exercise progression was also introduced and progression will be customized based on patient's performance and goals. 30 Day review completed. Medical Director ITP review done, changes made as directed, and signed approval by Medical Director.     new to prgram    Row Name 04/30/22 1031 05/28/22 1450         ITP Comments 30 Day review completed. Medical Director ITP review done, changes made as directed, and signed approval by Medical Director. 30 day review completed. ITP sent to Dr. Emily Filbert, Medical Director of Cardiac Rehab. Continue with ITP unless changes are made by physician.                Comments: 30 day review

## 2022-06-02 ENCOUNTER — Encounter: Payer: No Typology Code available for payment source | Admitting: *Deleted

## 2022-06-02 DIAGNOSIS — Z955 Presence of coronary angioplasty implant and graft: Secondary | ICD-10-CM

## 2022-06-02 DIAGNOSIS — I213 ST elevation (STEMI) myocardial infarction of unspecified site: Secondary | ICD-10-CM

## 2022-06-02 NOTE — Progress Notes (Signed)
Daily Session Note  Patient Details  Name: Chase Keller MRN: [id number] Date of Birth: 06-May-1940 Referring Provider:   Flowsheet Row Cardiac Rehab from 03/03/2022 in [facility] Cardiac and Pulmonary Rehab  Referring Provider Lendon Collar MD ([place])       Encounter Date: 06/02/2022  Check In:  Session Check In - 06/02/22 0936       Check-In   Supervising physician immediately available to respond to emergencies See telemetry face sheet for immediately available ER MD    Location [facility]-Cardiac & Pulmonary Rehab    Staff Present Darlyne Russian, RN, Doyce Para, BS, ACSM CEP, Exercise Physiologist;Joseph Tessie Fass, [place]    Virtual Visit No    Medication changes reported     No    Fall or balance concerns reported    No    Warm-up and Cool-down Performed on first and last piece of equipment    Resistance Training Performed Yes    VAD Patient? No    PAD/SET Patient? No      Pain Assessment   Currently in Pain? No/denies                Social History   Tobacco Use  Smoking Status Never  Smokeless Tobacco Never  Tobacco Comments   father smoked in the home while pt was growing up//ex-wife smoked    Goals Met:  Independence with exercise equipment Exercise tolerated well No report of concerns or symptoms today Strength training completed today  Goals Unmet:  Not Applicable  Comments: Pt able to follow exercise prescription today without complaint.  Will continue to monitor for progression.    Dr. Emily Filbert is Medical Director for [place].  Dr. Ottie Glazier is Medical Director for [facility] Pulmonary Rehabilitation.

## 2022-06-04 ENCOUNTER — Encounter: Payer: No Typology Code available for payment source | Admitting: *Deleted

## 2022-06-04 DIAGNOSIS — Z955 Presence of coronary angioplasty implant and graft: Secondary | ICD-10-CM | POA: Diagnosis not present

## 2022-06-04 NOTE — Progress Notes (Signed)
Daily Session Note  Patient Details  Name: Chase Keller MRN: [id number] Date of Birth: [birth year] Referring Provider:   Flowsheet Row Cardiac Rehab from 03/03/2022 in [facility] Cardiac and Pulmonary Rehab  Referring Provider Lendon Collar MD ([place])       Encounter Date: 06/04/2022  Check In:  Session Check In - 06/04/22 0931       Check-In   Supervising physician immediately available to respond to emergencies See telemetry face sheet for immediately available ER MD    Location [facility]-Cardiac & Pulmonary Rehab    Staff Present Darlyne Russian, RN, ADN;Laureen Owens Shark, BS, RRT, CPFT;Joseph Tessie Fass, RCP,RRT,BSRT;Noah Tickle, BS, Exercise Physiologist    Virtual Visit No    Medication changes reported     No    Fall or balance concerns reported    No    Warm-up and Cool-down Performed on first and last piece of equipment    Resistance Training Performed Yes    VAD Patient? No    PAD/SET Patient? No      Pain Assessment   Currently in Pain? No/denies                Social History   Tobacco Use  Smoking Status Never  Smokeless Tobacco Never  Tobacco Comments   father smoked in the home while pt was growing up//ex-wife smoked    Goals Met:  Independence with exercise equipment Exercise tolerated well No report of concerns or symptoms today Strength training completed today  Goals Unmet:  Not Applicable  Comments: Pt able to follow exercise prescription today without complaint.  Will continue to monitor for progression.    Dr. Emily Filbert is Medical Director for [place].  Dr. Ottie Glazier is Medical Director for [facility] Pulmonary Rehabilitation.

## 2022-06-06 ENCOUNTER — Encounter: Payer: No Typology Code available for payment source | Attending: Family Medicine | Admitting: *Deleted

## 2022-06-06 DIAGNOSIS — Z9889 Other specified postprocedural states: Secondary | ICD-10-CM | POA: Insufficient documentation

## 2022-06-06 DIAGNOSIS — I2510 Atherosclerotic heart disease of native coronary artery without angina pectoris: Secondary | ICD-10-CM | POA: Insufficient documentation

## 2022-06-06 DIAGNOSIS — Z955 Presence of coronary angioplasty implant and graft: Secondary | ICD-10-CM

## 2022-06-06 NOTE — Progress Notes (Signed)
Daily Session Note  Patient Details  Name: Chase Keller MRN: [id number] Date of Birth: [birth year] Referring Provider:   Flowsheet Row Cardiac Rehab from 03/03/2022 in [facility] Cardiac and Pulmonary Rehab  Referring Provider Lendon Collar MD ([place])       Encounter Date: 06/06/2022  Check In:  Session Check In - 06/06/22 0934       Check-In   Supervising physician immediately available to respond to emergencies See telemetry face sheet for immediately available ER MD    Location [facility]-Cardiac & Pulmonary Rehab    Staff Present Darlyne Russian, RN, ADN;Jessica Luan Pulling, MA, RCEP, CCRP, CCET;Joseph [place], [place]    Virtual Visit No    Medication changes reported     No    Fall or balance concerns reported    No    Warm-up and Cool-down Performed on first and last piece of equipment    Resistance Training Performed Yes    VAD Patient? No    PAD/SET Patient? No      Pain Assessment   Currently in Pain? No/denies                Social History   Tobacco Use  Smoking Status Never  Smokeless Tobacco Never  Tobacco Comments   father smoked in the home while pt was growing up//ex-wife smoked    Goals Met:  Independence with exercise equipment Exercise tolerated well No report of concerns or symptoms today Strength training completed today  Goals Unmet:  Not Applicable  Comments: Pt able to follow exercise prescription today without complaint.  Will continue to monitor for progression.    Dr. Emily Filbert is Medical Director for [place].  Dr. Ottie Glazier is Medical Director for [facility] Pulmonary Rehabilitation.

## 2022-06-09 ENCOUNTER — Encounter: Payer: No Typology Code available for payment source | Admitting: *Deleted

## 2022-06-09 DIAGNOSIS — Z9889 Other specified postprocedural states: Secondary | ICD-10-CM | POA: Diagnosis not present

## 2022-06-09 DIAGNOSIS — Z955 Presence of coronary angioplasty implant and graft: Secondary | ICD-10-CM

## 2022-06-09 NOTE — Progress Notes (Signed)
Daily Session Note  Patient Details  Name: JEMIR SOERGEL MRN: [id number] Date of Birth: 1941/02/19 Referring Provider:   Flowsheet Row Cardiac Rehab from 03/03/2022 in [facility] Cardiac and Pulmonary Rehab  Referring Provider Lendon Collar MD ([place])       Encounter Date: 06/09/2022  Check In:  Session Check In - 06/09/22 0934       Check-In   Supervising physician immediately available to respond to emergencies See telemetry face sheet for immediately available ER MD    Location [facility]-Cardiac & Pulmonary Rehab    Staff Present Darlyne Russian, RN, Doyce Para, BS, ACSM CEP, Exercise Physiologist;Noah Tickle, BS, Exercise Physiologist    Virtual Visit No    Medication changes reported     No    Fall or balance concerns reported    No    Warm-up and Cool-down Performed on first and last piece of equipment    Resistance Training Performed Yes    VAD Patient? No      Pain Assessment   Currently in Pain? No/denies                Social History   Tobacco Use  Smoking Status Never  Smokeless Tobacco Never  Tobacco Comments   father smoked in the home while pt was growing up//ex-wife smoked    Goals Met:  Independence with exercise equipment Exercise tolerated well No report of concerns or symptoms today Strength training completed today  Goals Unmet:  Not Applicable  Comments: Pt able to follow exercise prescription today without complaint.  Will continue to monitor for progression.    Dr. Emily Filbert is Medical Director for [place].  Dr. Ottie Glazier is Medical Director for [facility] Pulmonary Rehabilitation.

## 2022-06-11 ENCOUNTER — Encounter: Payer: No Typology Code available for payment source | Admitting: *Deleted

## 2022-06-11 DIAGNOSIS — Z9889 Other specified postprocedural states: Secondary | ICD-10-CM | POA: Diagnosis not present

## 2022-06-11 DIAGNOSIS — Z955 Presence of coronary angioplasty implant and graft: Secondary | ICD-10-CM

## 2022-06-11 NOTE — Progress Notes (Signed)
Daily Session Note  Patient Details  Name: Chase Keller MRN: [id number] Date of Birth: 05/31/1940 Referring Provider:   Flowsheet Row Cardiac Rehab from 03/03/2022 in [facility] Cardiac and Pulmonary Rehab  Referring Provider Lendon Collar MD ([place])       Encounter Date: 06/11/2022  Check In:  Session Check In - 06/11/22 0914       Check-In   Supervising physician immediately available to respond to emergencies See telemetry face sheet for immediately available ER MD    Location [facility]-Cardiac & Pulmonary Rehab    Staff Present Darlyne Russian, RN, ADN;Jessica Luan Pulling, MA, RCEP, CCRP, CCET;Noah Tickle, BS, Exercise Physiologist    Virtual Visit No    Medication changes reported     No    Fall or balance concerns reported    No    Warm-up and Cool-down Performed on first and last piece of equipment    Resistance Training Performed Yes    VAD Patient? No    PAD/SET Patient? No      Pain Assessment   Currently in Pain? No/denies                Social History   Tobacco Use  Smoking Status Never  Smokeless Tobacco Never  Tobacco Comments   father smoked in the home while pt was growing up//ex-wife smoked    Goals Met:  Independence with exercise equipment Exercise tolerated well No report of concerns or symptoms today Strength training completed today  Goals Unmet:  Not Applicable  Comments: Pt able to follow exercise prescription today without complaint.  Will continue to monitor for progression.    Dr. Emily Filbert is Medical Director for [place].  Dr. Ottie Glazier is Medical Director for [facility] Pulmonary Rehabilitation.

## 2022-06-13 ENCOUNTER — Encounter: Payer: No Typology Code available for payment source | Admitting: *Deleted

## 2022-06-13 VITALS — Ht 65.9 in | Wt 210.5 lb

## 2022-06-13 DIAGNOSIS — Z955 Presence of coronary angioplasty implant and graft: Secondary | ICD-10-CM

## 2022-06-13 DIAGNOSIS — Z9889 Other specified postprocedural states: Secondary | ICD-10-CM | POA: Diagnosis not present

## 2022-06-13 NOTE — Patient Instructions (Addendum)
Discharge Patient Instructions  Patient Details  Name: Chase Keller MRN: [id number] Date of Birth: 1941-01-24 Referring Provider:  [place]   Number of Visits: 36  Reason for Discharge:  Patient reached a stable level of exercise. Patient independent in their exercise. Patient has met program and personal goals.  Smoking History:  Social History   Tobacco Use  Smoking Status Never  Smokeless Tobacco Never  Tobacco Comments   father smoked in the home while pt was growing up//ex-wife smoked    Diagnosis:  No diagnosis found.  Initial Exercise Prescription:  Initial Exercise Prescription - 03/03/22 1500       Date of Initial Exercise RX and Referring Provider   Date 03/03/22    Referring Provider R[place], Vicente Males MD ([place])      Oxygen   Maintain Oxygen Saturation 88% or higher      Recumbant Bike   Level 1    RPM 50    Watts 5    Minutes 15    METs 2      NuStep   Level 1    SPM 80    Minutes 15    METs 2      T5 Nustep   Level 1    SPM 80    Minutes 15    METs 2      Biostep-RELP   Level 1    SPM 50    Minutes 15    METs 2      Track   Laps 27    Minutes 15    METs 2.47      Prescription Details   Frequency (times per week) 3    Duration Progress to 30 minutes of continuous aerobic without signs/symptoms of physical distress      Intensity   THRR 40-80% of Max Heartrate 92-123    Ratings of Perceived Exertion 11-13    Perceived Dyspnea 0-4      Progression   Progression Continue to progress workloads to maintain intensity without signs/symptoms of physical distress.      Resistance Training   Training Prescription Yes    Weight 5 lb    Reps 10-15             Discharge Exercise Prescription (Final Exercise Prescription Changes):  Exercise Prescription Changes - 06/10/22 1500       Response to Exercise   Blood Pressure (Admit) 134/72    Blood Pressure (Exit) 126/66    Heart Rate (Admit) 60 bpm    Heart  Rate (Exercise) 113 bpm    Heart Rate (Exit) 75 bpm    Rating of Perceived Exertion (Exercise) 12    Symptoms none    Duration Continue with 30 min of aerobic exercise without signs/symptoms of physical distress.    Intensity THRR unchanged      Progression   Progression Continue to progress workloads to maintain intensity without signs/symptoms of physical distress.    Average METs 2.75      Resistance Training   Training Prescription Yes    Weight 4 lb    Reps 10-15      Interval Training   Interval Training No      NuStep   Level 4    Minutes 30    METs 3.4      Biostep-RELP   Level 2    Minutes 30    METs 2      Oxygen   Maintain Oxygen Saturation 88%  or higher             Functional Capacity:  6 Minute Walk     Row Name 03/03/22 1507 06/13/22 0950       6 Minute Walk   Phase Initial Discharge    Distance 1040 feet 1150 feet    Distance % Change -- 10.6 %    Distance Feet Change -- 110 ft    Walk Time 6 minutes 6 minutes    # of Rest Breaks 0 0    MPH 197 2.18    METS 1.12 1.66    RPE 11 12    Perceived Dyspnea  2 2.5    VO2 Peak 5.31 5.84    Symptoms Yes (comment) Yes (comment)    Comments SOB SOB    Resting HR 61 bpm 56 bpm    Resting BP 128/64 126/62    Resting Oxygen Saturation  96 % 96 %    Exercise Oxygen Saturation  during 6 min walk 97 % 94 %    Max Ex. HR 105 bpm 89 bpm    Max Ex. BP 136/74 156/74    2 Minute Post BP 122/60 --            Nutrition & Weight - Outcomes:  Pre Biometrics - 03/03/22 1513       Pre Biometrics   Height 5' 5.9" (1.674 m)    Weight 213 lb 4.8 oz (96.8 kg)    Waist Circumference 44.75 inches    Hip Circumference 41.5 inches    Waist to Hip Ratio 1.08 %    BMI (Calculated) 34.53    Single Leg Stand 4.9 seconds             Post Biometrics - 06/13/22 0951        Post  Biometrics   Height 5' 5.9" (1.674 m)    Weight 210 lb 8 oz (95.5 kg)    Waist Circumference 44.5 inches    Hip  Circumference 42 inches    Waist to Hip Ratio 1.06 %    BMI (Calculated) 34.07    Single Leg Stand 2.8 seconds             Nutrition:  Nutrition Therapy & Goals - 03/03/22 1042       Nutrition Therapy   Diet Heart healthy, low Na, T2DM    Protein (specify units) 85-95g    Fiber 28 grams    Whole Grain Foods 3 servings    Saturated Fats 16 max. grams    Fruits and Vegetables 8 servings/day    Sodium 2 grams      Personal Nutrition Goals   Nutrition Goal ST: LT: limit sodium <2g/day, limit saturated fat <16g, follow MyPlate guidelines    Comments 82 y.o. M admitted to cardiac rehab s/p stent placement.PMHx includes HTN, CAD, CHF, a.fib, OSA, T2DM, hypothyroidism. Relevant medications includes apple cider vinegar pill, calcium, vitamin D3, CoQ10, B-12, ferrous sulfate, vitamin C, cinnamon, magnesium, potassium, red yeast extract, B-6, B-2, thiamine, tramadol, zinc, senokot, acai berry PO, alfalfa PO, MSM. Pt reports taking many supplements for a long time; discussed how many can be unnecessary, it is important to get our nutrients from food first when we can, supplements should be third party tested, you can exceed recommended doeses especially with fat soluble vitamins such as vitamin D, and to check with your health care provider before starting any supplements as they can also interact with medications and medical conditions,  pt voiced understanding. B: scrambled eggs or grits or oatmeal L: vegetable plate at restaurant (macoroni salad, green beans, peaches). He does not have raw red tomato as that bothers his gums. He limits bread and tries to not eat it. D: [employer] chicken on tuesday, vegetables when he goes out to eat. He also limits his dessert. He reports cooking about 2x/week. Erle reports he would like to lose weight around his midsection - discussed how we cannot spot-reduce fat, aging can cause natural body composition changes, and some behavior changes that can help such as  including fiber, reducing excess fat and added sugar, and honoring hunger. Reviewed heart healthy nutrition and diabetes guidelines. Discussed how going out to eat can make it hard to include whole grains, include variety, limit saturated fat, and limit sodium. Discussed some tips on how to eat more heart healthy options when out to eat such as including more non-starchy vegetables, choosing more lean proteins such as chicken, limiting red meat, limiting creamy sauces/dressings and limiting fried or breaded foods.      Intervention Plan   Intervention Prescribe, educate and counsel regarding individualized specific dietary modifications aiming towards targeted core components such as weight, hypertension, lipid management, diabetes, heart failure and other comorbidities.;Nutrition handout(s) given to patient.    Expected Outcomes Short Term Goal: Understand basic principles of dietary content, such as calories, fat, sodium, cholesterol and nutrients.;Short Term Goal: A plan has been developed with personal nutrition goals set during dietitian appointment.;Long Term Goal: Adherence to prescribed nutrition plan.           Goals reviewed with patient; copy given to patient.

## 2022-06-13 NOTE — Progress Notes (Signed)
Daily Session Note  Patient Details  Name: Chase Keller MRN: [id number] Date of Birth: Nov 11, 1940 Referring Provider:   Flowsheet Row Cardiac Rehab from 03/03/2022 in [facility] Cardiac and Pulmonary Rehab  Referring Provider Lendon Collar MD ([place])       Encounter Date: 06/13/2022  Check In:  Session Check In - 06/13/22 1006       Check-In   Supervising physician immediately available to respond to emergencies See telemetry face sheet for immediately available ER MD    Staff Present Heath Lark, RN, BSN, CCRP;Jessica [place], MA, RCEP, CCRP, CCET;Joseph [place], [place]    Virtual Visit No    Medication changes reported     No    Fall or balance concerns reported    No    Warm-up and Cool-down Performed on first and last piece of equipment    Resistance Training Performed Yes    VAD Patient? No    PAD/SET Patient? No      Pain Assessment   Currently in Pain? No/denies                Social History   Tobacco Use  Smoking Status Never  Smokeless Tobacco Never  Tobacco Comments   father smoked in the home while pt was growing up//ex-wife smoked    Goals Met:  Independence with exercise equipment Exercise tolerated well No report of concerns or symptoms today  Goals Unmet:  Not Applicable  Comments: Pt able to follow exercise prescription today without complaint.  Will continue to monitor for progression.    Dr. Emily Filbert is Medical Director for [place].  Dr. Ottie Glazier is Medical Director for [facility] Pulmonary Rehabilitation.

## 2022-06-18 DIAGNOSIS — Z955 Presence of coronary angioplasty implant and graft: Secondary | ICD-10-CM

## 2022-06-18 DIAGNOSIS — Z9889 Other specified postprocedural states: Secondary | ICD-10-CM | POA: Diagnosis not present

## 2022-06-18 NOTE — Progress Notes (Signed)
Daily Session Note  Patient Details  Name: Chase Keller MRN: [id number] Date of Birth: [birth year] Referring Provider:   Flowsheet Row Cardiac Rehab from 03/03/2022 in [facility] Cardiac and Pulmonary Rehab  Referring Provider Lendon Collar MD ([place])       Encounter Date: 06/18/2022  Check In:  Session Check In - 06/18/22 0916       Check-In   Supervising physician immediately available to respond to emergencies See telemetry face sheet for immediately available ER MD    Location [facility]-Cardiac & Pulmonary Rehab    Staff Present Darlyne Russian, RN, ADN;Joseph Tessie Fass, RCP,RRT,BSRT;Noah Tickle, BS, Exercise Physiologist    Virtual Visit No    Medication changes reported     No    Fall or balance concerns reported    No    Warm-up and Cool-down Performed on first and last piece of equipment    Resistance Training Performed Yes    VAD Patient? No    PAD/SET Patient? No      Pain Assessment   Currently in Pain? No/denies                Social History   Tobacco Use  Smoking Status Never  Smokeless Tobacco Never  Tobacco Comments   father smoked in the home while pt was growing up//ex-wife smoked    Goals Met:  Independence with exercise equipment Exercise tolerated well No report of concerns or symptoms today Strength training completed today  Goals Unmet:  Not Applicable  Comments: Pt able to follow exercise prescription today without complaint.  Will continue to monitor for progression.    Dr. Emily Filbert is Medical Director for [place].  Dr. Ottie Glazier is Medical Director for [facility] Pulmonary Rehabilitation.

## 2022-06-20 ENCOUNTER — Encounter: Payer: No Typology Code available for payment source | Admitting: *Deleted

## 2022-06-20 DIAGNOSIS — Z955 Presence of coronary angioplasty implant and graft: Secondary | ICD-10-CM

## 2022-06-20 NOTE — Progress Notes (Signed)
Daily Session Note  Patient Details  Name: GURFATEH FAGERBERG MRN: [id number] Date of Birth: 08/23/1940 Referring Provider:   Flowsheet Row Cardiac Rehab from 03/03/2022 in [facility] Cardiac and Pulmonary Rehab  Referring Provider Lendon Collar MD ([place])       Encounter Date: 06/20/2022  Check In:  Session Check In - 06/20/22 1011       Check-In   Supervising physician immediately available to respond to emergencies See telemetry face sheet for immediately available ER MD    Location [facility]-Cardiac & Pulmonary Rehab    Staff Present Heath Lark, RN, BSN, CCRP;Jessica [place], MA, RCEP, CCRP, CCET;Joseph [place], [place]    Virtual Visit No    Medication changes reported     No    Fall or balance concerns reported    No    Warm-up and Cool-down Performed on first and last piece of equipment    Resistance Training Performed Yes    VAD Patient? No    PAD/SET Patient? No      Pain Assessment   Currently in Pain? No/denies                Social History   Tobacco Use  Smoking Status Never  Smokeless Tobacco Never  Tobacco Comments   father smoked in the home while pt was growing up//ex-wife smoked    Goals Met:  Independence with exercise equipment Exercise tolerated well No report of concerns or symptoms today  Goals Unmet:  Not Applicable  Comments: Pt able to follow exercise prescription today without complaint.  Will continue to monitor for progression.    Dr. Emily Filbert is Medical Director for [place].  Dr. Ottie Glazier is Medical Director for [facility] Pulmonary Rehabilitation.

## 2022-06-23 ENCOUNTER — Encounter: Payer: No Typology Code available for payment source | Admitting: *Deleted

## 2022-06-23 DIAGNOSIS — Z955 Presence of coronary angioplasty implant and graft: Secondary | ICD-10-CM

## 2022-06-23 DIAGNOSIS — Z9889 Other specified postprocedural states: Secondary | ICD-10-CM | POA: Diagnosis not present

## 2022-06-23 NOTE — Progress Notes (Signed)
Daily Session Note  Patient Details  Name: MASIH YAKE MRN: [id number] Date of Birth: March 27, 1941 Referring Provider:   Flowsheet Row Cardiac Rehab from 03/03/2022 in [facility]  Referring Provider Lendon Collar MD ([place])       Encounter Date: 06/23/2022  Check In:  Session Check In - 06/23/22 0929       Check-In   Supervising physician immediately available to respond to emergencies See telemetry face sheet for immediately available ER MD    Location [facility]-Cardiac & Pulmonary Rehab    Staff Present Darlyne Russian, RN, Doyce Para, BS, ACSM CEP, Exercise Physiologist;Noah Tickle, BS, Exercise Physiologist    Virtual Visit No    Medication changes reported     No    Fall or balance concerns reported    No    Warm-up and Cool-down Performed on first and last piece of equipment    Resistance Training Performed Yes    VAD Patient? No    PAD/SET Patient? No      Pain Assessment   Currently in Pain? No/denies                Social History   Tobacco Use  Smoking Status Never  Smokeless Tobacco Never  Tobacco Comments   father smoked in the home while pt was growing up//ex-wife smoked    Goals Met:  Independence with exercise equipment Exercise tolerated well No report of concerns or symptoms today Strength training completed today  Goals Unmet:  Not Applicable  Comments:  Roche graduated today from  rehab with 36 sessions completed.  Details of the patient's exercise prescription and what He needs to do in order to continue the prescription and progress were discussed with patient.  Patient was given a copy of prescription and goals.  Patient verbalized understanding.  Oskar plans to continue to exercise by walking and staff videos.    Dr. Emily Filbert is Medical Director for [facility].  Dr. Ottie Glazier is Medical Director for [facility] Pulmonary Rehabilitation.

## 2022-06-23 NOTE — Progress Notes (Signed)
Cardiac Individual Treatment Plan  Patient Details  Name: Chase Keller MRN: [id number] Date of Birth: [birth year] Referring Provider:   Flowsheet Row Cardiac Rehab from 03/03/2022 in [facility] Cardiac and Pulmonary Rehab  Referring Provider Lendon Collar MD ([place])       Initial Encounter Date:  Flowsheet Row Cardiac Rehab from 03/03/2022 in [facility] Cardiac and Pulmonary Rehab  Date 03/03/22       Visit Diagnosis: Status post coronary artery stent placement  Patient's Home Medications on Admission:  Current Outpatient Medications:    ACAI BERRY PO, Take 3,000 mg by mouth daily., Disp: , Rfl:    albuterol (VENTOLIN HFA) 108 (90 Base) MCG/ACT inhaler, Inhale 2 puffs into the lungs every 6 (six) hours as needed for wheezing or shortness of breath., Disp: , Rfl:    ALFALFA PO, Take 1,300 mg by mouth 2 (two) times daily., Disp: , Rfl:    allopurinol (ZYLOPRIM) 300 MG tablet, Take 300 mg by mouth daily., Disp: , Rfl:    amLODipine (NORVASC) 2.5 MG tablet, Take 2.5 mg by mouth daily as needed (If systolic BP over 226.)., Disp: , Rfl:    ammonium lactate (LAC-HYDRIN) 12 % lotion, Apply 1 application topically daily as needed for dry skin., Disp: , Rfl:    ammonium lactate (LAC-HYDRIN) 12 % lotion, APPLY SMALL AMOUNT TOPICALLY IN THE MORNING FOR DRY SKIN (Patient not taking: Reported on 02/19/2022), Disp: , Rfl:    apixaban (ELIQUIS) 5 MG TABS tablet, TAKE ONE TABLET BY MOUTH EVERY 12 HOURS CAUTION BLOOD THINNER THIS REPLACES YOUR WARFARIN, Disp: , Rfl:    APPLE CIDER VINEGAR PO, Take 480 mg by mouth daily., Disp: , Rfl:    Ascorbic Acid (VITAMIN C) 1000 MG tablet, Take 1,000 mg by mouth daily., Disp: , Rfl:    aspirin EC 81 MG tablet, Take 81 mg by mouth daily., Disp: , Rfl:    benzonatate (TESSALON) 100 MG capsule, Take 1 capsule by mouth 3 (three) times daily as needed., Disp: , Rfl:    Calcium Carb-Cholecalciferol (CALCIUM/VITAMIN D) 600-400 MG-UNIT TABS, Take 1 tablet by mouth 2 (two)  times daily. (Patient not taking: Reported on 02/19/2022), Disp: , Rfl:    Calcium Carbonate-Vit D-Min (CALCIUM 600+D PLUS MINERALS) 600-400 MG-UNIT TABS, Take 1 tablet by mouth 2 (two) times daily., Disp: , Rfl:    carvedilol (COREG) 6.25 MG tablet, Take by mouth 2 (two) times daily with a meal., Disp: , Rfl:    Cholecalciferol (VITAMIN D3) 250 MCG (10000 UT) capsule, Take 10,000 Units by mouth daily., Disp: , Rfl:    Cholecalciferol 125 MCG (5000 UT) TABS, Take by mouth., Disp: , Rfl:    Cinnamon 500 MG capsule, Take 500 mg by mouth daily., Disp: , Rfl:    Coenzyme Q10 200 MG capsule, Take 200 mg by mouth daily., Disp: , Rfl:    cyanocobalamin 100 MCG tablet, TAKE ONE TABLET BY MOUTH EVERY DAY TO PREVENT VITAMIN B12 DEFICIENCY (Patient not taking: Reported on 02/19/2022), Disp: , Rfl:    cyanocobalamin 1000 MCG tablet, Take 1,000 mcg by mouth daily., Disp: , Rfl:    ezetimibe (ZETIA) 10 MG tablet, Take 10 mg by mouth daily., Disp: , Rfl:    ferrous sulfate 325 (65 FE) MG tablet, Take 325 mg by mouth daily with breakfast., Disp: , Rfl:    fluticasone (FLONASE) 50 MCG/ACT nasal spray, Place 2 sprays into both nostrils daily as needed for rhinitis. (Patient not taking: Reported on 02/19/2022), Disp: , Rfl:  fluticasone (FLONASE) 50 MCG/ACT nasal spray, Place into the nose., Disp: , Rfl:    Glucosamine-Chondroit-Vit C-Mn (GLUCOSAMINE CHONDR 500 COMPLEX) CAPS, Take 2 capsules by mouth 2 (two) times daily., Disp: , Rfl:    lidocaine (LIDODERM) 5 %, Place onto the skin., Disp: , Rfl:    MAGNESIUM PO, Take by mouth., Disp: , Rfl:    niacin 500 MG CR capsule, Take 500 mg by mouth at bedtime. B3, Disp: , Rfl:    Potassium 99 MG TABS, Take 99 mg by mouth 2 (two) times daily., Disp: , Rfl:    potassium gluconate 595 (99 K) MG TABS tablet, Take by mouth., Disp: , Rfl:    pyridOXINE (B-6) 50 MG tablet, Take 100 mg by mouth daily., Disp: , Rfl:    pyridOXINE (B-6) 50 MG tablet, Take 2 tablets by mouth  daily. (Patient not taking: Reported on 02/19/2022), Disp: , Rfl:    RED YEAST RICE EXTRACT PO, Take 600 mg by mouth 2 (two) times daily., Disp: , Rfl:    riboflavin (VITAMIN B-2) 100 MG TABS tablet, Take 100 mg by mouth daily., Disp: , Rfl:    senna-docusate (SENOKOT-S) 8.6-50 MG tablet, Take 1 tablet by mouth 2 (two) times daily., Disp: , Rfl:    Skin Protectants, Misc. (HYDROCERIN EX), Apply 1 application topically daily as needed (dry skin)., Disp: , Rfl:    Skin Protectants, Misc. (MINERIN CREME EX), Apply topically., Disp: , Rfl:    spironolactone (ALDACTONE) 25 MG tablet, Take 12.5 mg by mouth daily. (Patient not taking: Reported on 02/19/2022), Disp: , Rfl:    spironolactone (ALDACTONE) 25 MG tablet, Take 0.5 tablets by mouth daily., Disp: , Rfl:    tamsulosin (FLOMAX) 0.4 MG CAPS capsule, Take 0.4 mg by mouth at bedtime. (Patient not taking: Reported on 02/19/2022), Disp: , Rfl:    tamsulosin (FLOMAX) 0.4 MG CAPS capsule, Take 1 capsule by mouth at bedtime., Disp: , Rfl:    thiamine 100 MG tablet, Take 100 mg by mouth daily., Disp: , Rfl:    traMADol (ULTRAM) 50 MG tablet, Take 50 mg by mouth every 6 (six) hours as needed for moderate pain., Disp: , Rfl:    trospium (SANCTURA) 20 MG tablet, Take 20 mg by mouth 2 (two) times daily., Disp: , Rfl:    warfarin (COUMADIN) 1 MG tablet, Take 3 mg by mouth at bedtime., Disp: , Rfl:    zinc gluconate 50 MG tablet, Take 50 mg by mouth daily., Disp: , Rfl:   Past Medical History: Past Medical History:  Diagnosis Date   Agent orange exposure    Atrial fibrillation ([place]) 2013   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   CHF (congestive heart failure) (HCC)    COPD (chronic obstructive pulmonary disease) (HCC)    Coronary artery disease    Diabetes mellitus without complication (HCC)    diet controlled   Dysrhythmia     A fib   Heart murmur    / 1 MD said yes, 1 said no   History of blood transfusion    during CABG   History of kidney stones     Hyperlipidemia    Hypertension    Left knee DJD    MRSA (methicillin resistant staph aureus) culture positive    Drainage from Left Knee approx 2005   Myocardial infarction ([place])    mild in 1998 per Wife    PONV (postoperative nausea and vomiting)    S/P coronary artery bypass graft x  2    1998   Shortness of breath    Sleep apnea    CPAP   Stented coronary artery     Tobacco Use: Social History   Tobacco Use  Smoking Status Never  Smokeless Tobacco Never  Tobacco Comments   father smoked in the home while pt was growing up//ex-wife smoked    Labs: Review Flowsheet       Latest Ref Rng & Units 03/28/2011 01/14/2012 02/25/2012 06/04/2020  Labs for ITP Cardiac and Pulmonary Rehab  Cholestrol 0 - 200 mg/dL 179  158  - -  LDL (calc) 0 - 99 mg/dL 124  110  - -  HDL-C >39.00 mg/dL 26.00  24.00  - -  Trlycerides 0.0 - 149.0 mg/dL 146.0  122.0  - -  Hemoglobin A1c 4.8 - 5.6 % - - 6.3  6.1      Exercise Target Goals: Exercise Program Goal: Individual exercise prescription set using results from initial 6 min walk test and THRR while considering  patient's activity barriers and safety.   Exercise Prescription Goal: Initial exercise prescription builds to 30-45 minutes a day of aerobic activity, 2-3 days per week.  Home exercise guidelines will be given to patient during program as part of exercise prescription that the participant will acknowledge.   Education: Aerobic Exercise: - Group verbal and visual presentation on the components of exercise prescription. Introduces F.I.T.T principle from ACSM for exercise prescriptions.  Reviews F.I.T.T. principles of aerobic exercise including progression. Written material given at graduation. Flowsheet Row Cardiac Rehab from 06/18/2022 in [facility] Cardiac and Pulmonary Rehab  Education need identified 03/03/22       Education: Resistance Exercise: - Group verbal and visual presentation on the components of exercise prescription.  Introduces F.I.T.T principle from ACSM for exercise prescriptions  Reviews F.I.T.T. principles of resistance exercise including progression. Written material given at graduation.    Education: Exercise & Equipment Safety: - Individual verbal instruction and demonstration of equipment use and safety with use of the equipment. Flowsheet Row Cardiac Rehab from 06/18/2022 in [facility] Cardiac and Pulmonary Rehab  Date 02/19/22  Educator [facility]  Instruction Review Code 1- Verbalizes Understanding       Education: Exercise Physiology & General Exercise Guidelines: - Group verbal and written instruction with models to review the exercise physiology of the cardiovascular system and associated critical values. Provides general exercise guidelines with specific guidelines to those with heart or lung disease.    Education: Flexibility, Balance, Mind/Body Relaxation: - Group verbal and visual presentation with interactive activity on the components of exercise prescription. Introduces F.I.T.T principle from ACSM for exercise prescriptions. Reviews F.I.T.T. principles of flexibility and balance exercise training including progression. Also discusses the mind body connection.  Reviews various relaxation techniques to help reduce and manage stress (i.e. Deep breathing, progressive muscle relaxation, and visualization). Balance handout provided to take home. Written material given at graduation.   Activity Barriers & Risk Stratification:  Activity Barriers & Cardiac Risk Stratification - 03/03/22 1507       Activity Barriers & Cardiac Risk Stratification   Activity Barriers Left Knee Replacement;Arthritis;Deconditioning;Muscular Weakness;Shortness of Breath   arthritis in feet   Cardiac Risk Stratification Moderate             6 Minute Walk:  6 Minute Walk     Row Name 03/03/22 1507 06/13/22 0950       6 Minute Walk   Phase Initial Discharge    Distance 1040 feet 1150 feet  Distance % Change --  10.6 %    Distance Feet Change -- 110 ft    Walk Time 6 minutes 6 minutes    # of Rest Breaks 0 0    MPH 197 2.18    METS 1.12 1.66    RPE 11 12    Perceived Dyspnea  2 2.5    VO2 Peak 5.31 5.84    Symptoms Yes (comment) Yes (comment)    Comments SOB SOB    Resting HR 61 bpm 56 bpm    Resting BP 128/64 126/62    Resting Oxygen Saturation  96 % 96 %    Exercise Oxygen Saturation  during 6 min walk 97 % 94 %    Max Ex. HR 105 bpm 89 bpm    Max Ex. BP 136/74 156/74    2 Minute Post BP 122/60 --             Oxygen Initial Assessment:   Oxygen Re-Evaluation:   Oxygen Discharge (Final Oxygen Re-Evaluation):   Initial Exercise Prescription:  Initial Exercise Prescription - 03/03/22 1500       Date of Initial Exercise RX and Referring Provider   Date 03/03/22    Referring Provider Ruther[place], Vicente Males MD ([place])      Oxygen   Maintain Oxygen Saturation 88% or higher      Recumbant Bike   Level 1    RPM 50    Watts 5    Minutes 15    METs 2      NuStep   Level 1    SPM 80    Minutes 15    METs 2      T5 Nustep   Level 1    SPM 80    Minutes 15    METs 2      Biostep-RELP   Level 1    SPM 50    Minutes 15    METs 2      Track   Laps 27    Minutes 15    METs 2.47      Prescription Details   Frequency (times per week) 3    Duration Progress to 30 minutes of continuous aerobic without signs/symptoms of physical distress      Intensity   THRR 40-80% of Max Heartrate 92-123    Ratings of Perceived Exertion 11-13    Perceived Dyspnea 0-4      Progression   Progression Continue to progress workloads to maintain intensity without signs/symptoms of physical distress.      Resistance Training   Training Prescription Yes    Weight 5 lb    Reps 10-15             Perform Capillary Blood Glucose checks as needed.  Exercise Prescription Changes:   Exercise Prescription Changes     Row Name 03/03/22 1500 03/18/22 1500 04/02/22 1100 04/15/22 1500  05/01/22 1400     Response to Exercise   Blood Pressure (Admit) 128/64 128/72 124/62 126/62 128/70   Blood Pressure (Exercise) 136/74 150/88 142/68 134/74 152/84   Blood Pressure (Exit) 122/60 122/62 116/74 122/62 118/78   Heart Rate (Admit) 61 bpm 67 bpm 70 bpm 81 bpm 64 bpm   Heart Rate (Exercise) 105 bpm 103 bpm 103 bpm 104 bpm 102 bpm   Heart Rate (Exit) 75 bpm 66 bpm 76 bpm 80 bpm 85 bpm   Oxygen Saturation (Admit) 96 % -- -- -- --   Oxygen Saturation (  Exercise) 97 % -- -- -- --   Rating of Perceived Exertion (Exercise) 11 13 12 12 12    Perceived Dyspnea (Exercise) 2 -- -- -- --   Symptoms SOB none none none none   Comments walk test results 3rd full day of exercise -- -- --   Duration -- Progress to 30 minutes of  aerobic without signs/symptoms of physical distress Progress to 30 minutes of  aerobic without signs/symptoms of physical distress Continue with 30 min of aerobic exercise without signs/symptoms of physical distress. Continue with 30 min of aerobic exercise without signs/symptoms of physical distress.   Intensity -- THRR unchanged THRR unchanged THRR unchanged THRR unchanged     Progression   Progression -- Continue to progress workloads to maintain intensity without signs/symptoms of physical distress. Continue to progress workloads to maintain intensity without signs/symptoms of physical distress. Continue to progress workloads to maintain intensity without signs/symptoms of physical distress. Continue to progress workloads to maintain intensity without signs/symptoms of physical distress.   Average METs -- 2.25 2.58 2.7 2.32     Resistance Training   Training Prescription -- Yes Yes Yes Yes   Weight -- 5 lb 5 lb 2 lb 2 lb   Reps -- 10-15 10-15 10-15 10-15     Interval Training   Interval Training -- No No No No     Recumbant Bike   Level -- 3 -- -- --   Minutes -- 15 -- -- --   METs -- 2 -- -- --     NuStep   Level -- 2 4 -- 4   Minutes -- 30 30 -- 30   METs  -- 2 3 -- 3.3     T5 Nustep   Level -- 2 -- 4 --   Minutes -- 15 -- 30 --   METs -- -- -- 3 --     Biostep-RELP   Level -- 3 3 2 3    Minutes -- 15 30 30 30    METs -- 3 2 2 2      Oxygen   Maintain Oxygen Saturation -- 88% or higher 88% or higher 88% or higher 88% or higher    Row Name 05/13/22 1500 05/28/22 1500 06/10/22 1500         Response to Exercise   Blood Pressure (Admit) 128/60 122/68 134/72     Blood Pressure (Exit) 112/64 132/64 126/66     Heart Rate (Admit) 61 bpm 60 bpm 60 bpm     Heart Rate (Exercise) 89 bpm 81 bpm 113 bpm     Heart Rate (Exit) 71 bpm 70 bpm 75 bpm     Rating of Perceived Exertion (Exercise) 12 12 12      Symptoms none foot pain none     Duration Continue with 30 min of aerobic exercise without signs/symptoms of physical distress. Continue with 30 min of aerobic exercise without signs/symptoms of physical distress. Continue with 30 min of aerobic exercise without signs/symptoms of physical distress.     Intensity THRR unchanged THRR unchanged THRR unchanged       Progression   Progression Continue to progress workloads to maintain intensity without signs/symptoms of physical distress. Continue to progress workloads to maintain intensity without signs/symptoms of physical distress. Continue to progress workloads to maintain intensity without signs/symptoms of physical distress.     Average METs 2.43 1.95 2.75       Resistance Training   Training Prescription Yes Yes Yes     Weight  4 lb 4 lb 4 lb     Reps 10-15 10-15 10-15       Interval Training   Interval Training No No No       NuStep   Level 2 -- 4     Minutes 15 -- 30     METs -- -- 3.4       REL-XR   Level 3 1 --     Minutes 15 15 --     METs 2 2.3 --       Biostep-RELP   Level 2 -- 2     Minutes 15 -- 30     METs 3 -- 2       Track   Laps 15 11 --     Minutes 15 15 --     METs 1.82 1.6 --       Oxygen   Maintain Oxygen Saturation 88% or higher 88% or higher 88% or higher               Exercise Comments:   Exercise Comments     Row Name 03/10/22 1038           Exercise Comments First full day of exercise!  Patient was oriented to gym and equipment including functions, settings, policies, and procedures.  Patient's individual exercise prescription and treatment plan were reviewed.  All starting workloads were established based on the results of the 6 minute walk test done at initial orientation visit.  The plan for exercise progression was also introduced and progression will be customized based on patient's performance and goals.                Exercise Goals and Review:   Exercise Goals     Row Name 03/03/22 1512             Exercise Goals   Increase Physical Activity Yes       Intervention Provide advice, education, support and counseling about physical activity/exercise needs.;Develop an individualized exercise prescription for aerobic and resistive training based on initial evaluation findings, risk stratification, comorbidities and participant's personal goals.       Expected Outcomes Short Term: Attend rehab on a regular basis to increase amount of physical activity.;Long Term: Add in home exercise to make exercise part of routine and to increase amount of physical activity.;Long Term: Exercising regularly at least 3-5 days a week.       Increase Strength and Stamina Yes       Intervention Provide advice, education, support and counseling about physical activity/exercise needs.;Develop an individualized exercise prescription for aerobic and resistive training based on initial evaluation findings, risk stratification, comorbidities and participant's personal goals.       Expected Outcomes Short Term: Increase workloads from initial exercise prescription for resistance, speed, and METs.;Short Term: Perform resistance training exercises routinely during rehab and add in resistance training at home;Long Term: Improve cardiorespiratory fitness,  muscular endurance and strength as measured by increased METs and functional capacity (6MWT)       Able to understand and use rate of perceived exertion (RPE) scale Yes       Intervention Provide education and explanation on how to use RPE scale       Expected Outcomes Short Term: Able to use RPE daily in rehab to express subjective intensity level;Long Term:  Able to use RPE to guide intensity level when exercising independently       Able to understand and use Dyspnea scale  Yes       Intervention Provide education and explanation on how to use Dyspnea scale       Expected Outcomes Short Term: Able to use Dyspnea scale daily in rehab to express subjective sense of shortness of breath during exertion;Long Term: Able to use Dyspnea scale to guide intensity level when exercising independently       Knowledge and understanding of Target Heart Rate Range (THRR) Yes       Intervention Provide education and explanation of THRR including how the numbers were predicted and where they are located for reference       Expected Outcomes Short Term: Able to state/look up THRR;Short Term: Able to use daily as guideline for intensity in rehab;Long Term: Able to use THRR to govern intensity when exercising independently       Able to check pulse independently Yes       Intervention Provide education and demonstration on how to check pulse in carotid and radial arteries.;Review the importance of being able to check your own pulse for safety during independent exercise       Expected Outcomes Short Term: Able to explain why pulse checking is important during independent exercise;Long Term: Able to check pulse independently and accurately       Understanding of Exercise Prescription Yes       Intervention Provide education, explanation, and written materials on patient's individual exercise prescription       Expected Outcomes Short Term: Able to explain program exercise prescription;Long Term: Able to explain home  exercise prescription to exercise independently                Exercise Goals Re-Evaluation :  Exercise Goals Re-Evaluation     Row Name 03/10/22 1038 03/18/22 1529 03/24/22 0926 04/02/22 1139 04/15/22 1537     Exercise Goal Re-Evaluation   Exercise Goals Review Able to understand and use rate of perceived exertion (RPE) scale;Able to understand and use Dyspnea scale;Knowledge and understanding of Target Heart Rate Range (THRR);Understanding of Exercise Prescription Increase Physical Activity;Increase Strength and Stamina;Understanding of Exercise Prescription Increase Physical Activity;Increase Strength and Stamina;Understanding of Exercise Prescription -- Increase Physical Activity;Increase Strength and Stamina;Understanding of Exercise Prescription   Comments Reviewed RPE scale, THR and program prescription with pt today.  Pt voiced understanding and was given a copy of goals to take home. Kaushik is off to a good start with rehab for the first couple of sessions he has been here. He has already increased on all of his seated machines between level 2-3. Patient was not willing to try walking on the track yet, but will keep encouraging patient until he feels ready. Will continue to monitor as he progresses in the program. Reinhart is tolerating his exercise well. He has been coming consistently to cardiac rehab. He has made some increases in his workloads since starting the program. He does have a bad foot and is working with the [place] to have that checked before he starts walking in the program. Emanuell is doing well in rehab. He recently improved his overall average MET level to 2.58 METs. He also was able to improve to level 4 on the T4. He has been doing his seated machines for 30 minutes and has not done any walking during his rehab sessions. We will encourage Neri that improving his walking is an important part of the program. We will continue to monitor his progress in the program. Parson continues to do  well considering what he is  limited with. He did increase to level 4 on the T4 Nustep. Even though he is unable to walk at this time, he has been hitting his THR each session. We will continue to follow up on progress.   Expected Outcomes Short: Use RPE daily to regulate intensity. Long: Follow program prescription in THR. Short: Incorporate couple laps of walking on track when ready Long: Continue to increase overall MET level Short: Incorporate couple laps of walking on track when ready Long: Continue to increase overall MET level Short: Begin walking on track. Long: Continue to increase strength and stamina. Short: Continue to increase seated machines Long: Continue to increase overall MET level    Row Name 04/18/22 0926 05/01/22 1408 05/07/22 0950 05/13/22 1504 05/28/22 1504     Exercise Goal Re-Evaluation   Exercise Goals Review Increase Physical Activity;Increase Strength and Stamina;Understanding of Exercise Prescription;Able to understand and use rate of perceived exertion (RPE) scale;Knowledge and understanding of Target Heart Rate Range (THRR);Able to understand and use Dyspnea scale;Able to check pulse independently Increase Physical Activity;Increase Strength and Stamina;Understanding of Exercise Prescription Increase Physical Activity;Increase Strength and Stamina;Understanding of Exercise Prescription Increase Physical Activity;Increase Strength and Stamina;Understanding of Exercise Prescription Increase Physical Activity;Increase Strength and Stamina;Understanding of Exercise Prescription   Comments Kunta is doing well in rehab.  He is already walking some at home. Reviewed home exercise with pt today.  Pt plans to walking and weights at home for exercise.  Reviewed THR, pulse, RPE, sign and symptoms, pulse oximetery and when to call 911 or MD.  Also discussed weather considerations and indoor options.  Pt voiced understanding. Dedan is doing well in rehab. He has continued to work on the T4 and  Biostep for 30 minutes of exercise. He has not started walking during his time in rehab but we will continue to encourage him to begin walking. We will continue to monitor his progress in the program. Lonn is doing well in rehab.  He is staying busy at home.  He has had several funerals and weddings recently.  He was able to walk some today.  He has noticed that his stamina has improved some since starting rehab. Daquon continues to do well in rehab. He started to walk at his sessions and was able to walk 15 laps on the track for 2 sessions! He tried out the XR for the first time and was able to work at level 3. He is now using 4 lbs for handweights. We will continue to monitor Waino has only attended rehab once since the last review. During that session he was able to walk 11 laps on the track. He also worked on the XR at level 1. He has continued to use 4 lb hand weights for resistance training as well.  We will continue to monitor his progress in the program.   Expected Outcomes Short: Start to add in exercise at home consistently Long: Continue to improve stamina Short: Begin walking some while in rehab. Long: Continue to improve strength and stamina. short: continue to add in more walking Long: continue to improve stamina Short: Slowly increase more laps on track, strive for 20 Long: Continue to increase overall MET level and stamina Short: Return to regular attendance, progressively increase more laps on the track. Long: Continue to improve strength and stamina.    [place] Level Name 06/10/22 1541 06/11/22 0936           Exercise Goal Re-Evaluation   Exercise Goals Review Increase Physical Activity;Increase Strength and  Stamina;Understanding of Exercise Prescription Increase Physical Activity;Increase Strength and Stamina;Understanding of Exercise Prescription      Comments Zavon is doing well in  rehab, though he has been limited by foot pain which is limiting him from walking during his exercise. He was only  able to participate on the T4 and BS seated machines, working at level 4 on the T4 Nustep consistently. He is due for his post 6MWT and we hope to see improvement. Will continue to monitor. Evyn is doing well in rehab.  He is walking more now at home on his off days.  He is also using 5 lb weights at home.  He has found that his stamina is improving as he is now able to get out to work in the yard more.      Expected Outcomes Short: Improve on post 6MWT Long: Continue to increase overall MET level and stamina Short: Continue to stay active at home Long: Continue to improve stamina               Discharge Exercise Prescription (Final Exercise Prescription Changes):  Exercise Prescription Changes - 06/10/22 1500       Response to Exercise   Blood Pressure (Admit) 134/72    Blood Pressure (Exit) 126/66    Heart Rate (Admit) 60 bpm    Heart Rate (Exercise) 113 bpm    Heart Rate (Exit) 75 bpm    Rating of Perceived Exertion (Exercise) 12    Symptoms none    Duration Continue with 30 min of aerobic exercise without signs/symptoms of physical distress.    Intensity THRR unchanged      Progression   Progression Continue to progress workloads to maintain intensity without signs/symptoms of physical distress.    Average METs 2.75      Resistance Training   Training Prescription Yes    Weight 4 lb    Reps 10-15      Interval Training   Interval Training No      NuStep   Level 4    Minutes 30    METs 3.4      Biostep-RELP   Level 2    Minutes 30    METs 2      Oxygen   Maintain Oxygen Saturation 88% or higher             Nutrition:  Target Goals: Understanding of nutrition guidelines, daily intake of sodium 1500mg , cholesterol 200mg , calories 30% from fat and 7% or less from saturated fats, daily to have 5 or more servings of fruits and vegetables.  Education: All About Nutrition: -Group instruction provided by verbal, written material, interactive activities,  discussions, models, and posters to present general guidelines for heart healthy nutrition including fat, fiber, MyPlate, the role of sodium in heart healthy nutrition, utilization of the nutrition label, and utilization of this knowledge for meal planning. Follow up email sent as well. Written material given at graduation. Flowsheet Row Cardiac Rehab from 06/18/2022 in [facility] Cardiac and Pulmonary Rehab  Education need identified 03/03/22       Biometrics:  Pre Biometrics - 03/03/22 1513       Pre Biometrics   Height 5' 5.9" (1.674 m)    Weight 213 lb 4.8 oz (96.8 kg)    Waist Circumference 44.75 inches    Hip Circumference 41.5 inches    Waist to Hip Ratio 1.08 %    BMI (Calculated) 34.53    Single Leg Stand 4.9 seconds  Post Biometrics - 06/13/22 [id number]        Post  Biometrics   Height 5' 5.9" (1.674 m)    Weight 210 lb 8 oz (95.5 kg)    Waist Circumference 44.5 inches    Hip Circumference 42 inches    Waist to Hip Ratio 1.06 %    BMI (Calculated) 34.07    Single Leg Stand 2.8 seconds             Nutrition Therapy Plan and Nutrition Goals:  Nutrition Therapy & Goals - 03/03/22 1042       Nutrition Therapy   Diet Heart healthy, low Na, T2DM    Protein (specify units) 85-95g    Fiber 28 grams    Whole Grain Foods 3 servings    Saturated Fats 16 max. grams    Fruits and Vegetables 8 servings/day    Sodium 2 grams      Personal Nutrition Goals   Nutrition Goal ST: LT: limit sodium <2g/day, limit saturated fat <16g, follow MyPlate guidelines    Comments 82 y.o. M admitted to cardiac rehab s/p stent placement.PMHx includes HTN, CAD, CHF, a.fib, OSA, T2DM, hypothyroidism. Relevant medications includes apple cider vinegar pill, calcium, vitamin D3, CoQ10, B-12, ferrous sulfate, vitamin C, cinnamon, magnesium, potassium, red yeast extract, B-6, B-2, thiamine, tramadol, zinc, senokot, acai berry PO, alfalfa PO, MSM. Pt reports taking many supplements for a  long time; discussed how many can be unnecessary, it is important to get our nutrients from food first when we can, supplements should be third party tested, you can exceed recommended doeses especially with fat soluble vitamins such as vitamin D, and to check with your health care provider before starting any supplements as they can also interact with medications and medical conditions, pt voiced understanding. B: scrambled eggs or grits or oatmeal L: vegetable plate at restaurant (macoroni salad, green beans, peaches). He does not have raw red tomato as that bothers his gums. He limits bread and tries to not eat it. D: [employer] chicken on tuesday, vegetables when he goes out to eat. He also limits his dessert. He reports cooking about 2x/week. Veronica reports he would like to lose weight around his midsection - discussed how we cannot spot-reduce fat, aging can cause natural body composition changes, and some behavior changes that can help such as including fiber, reducing excess fat and added sugar, and honoring hunger. Reviewed heart healthy nutrition and diabetes guidelines. Discussed how going out to eat can make it hard to include whole grains, include variety, limit saturated fat, and limit sodium. Discussed some tips on how to eat more heart healthy options when out to eat such as including more non-starchy vegetables, choosing more lean proteins such as chicken, limiting red meat, limiting creamy sauces/dressings and limiting fried or breaded foods.      Intervention Plan   Intervention Prescribe, educate and counsel regarding individualized specific dietary modifications aiming towards targeted core components such as weight, hypertension, lipid management, diabetes, heart failure and other comorbidities.;Nutrition handout(s) given to patient.    Expected Outcomes Short Term Goal: Understand basic principles of dietary content, such as calories, fat, sodium, cholesterol and nutrients.;Short Term  Goal: A plan has been developed with personal nutrition goals set during dietitian appointment.;Long Term Goal: Adherence to prescribed nutrition plan.             Nutrition Assessments:  MEDIFICTS Score Key: ?70 Need to make dietary changes  40-70 Heart Healthy Diet ? 40 Therapeutic  Level Cholesterol Diet  Flowsheet Row Cardiac Rehab from 06/20/2022 in [facility] Cardiac and Pulmonary Rehab  Picture Your Plate Total Score on Admission 69  Picture Your Plate Total Score on Discharge 59      Picture Your Plate Scores: [id number] Unhealthy dietary pattern with much room for improvement. 41-50 Dietary pattern unlikely to meet recommendations for good health and room for improvement. 51-60 More healthful dietary pattern, with some room for improvement.  >60 Healthy dietary pattern, although there may be some specific behaviors that could be improved.    Nutrition Goals Re-Evaluation:  Nutrition Goals Re-Evaluation     [place] Name 03/24/22 0931 04/18/22 0930 05/07/22 0954 06/11/22 1058       Goals   Nutrition Goal ST: LT: limit sodium <2g/day, limit saturated fat <16g, follow MyPlate guidelines Short: continue to to limit sodium and fat. Long: continue to make heart healthy dietary choices to help control risk factors. Short: Continue to work on portion sizes Long: conitnue to focus on heart healthy eating Short: Try to eat throughout day versus skipping meals Long: Conitnue to work on healthier eating    Comment Patient reports that he has been trying to cut back on sodium an fat. He now eats his seafood broiled instead of fried. He has also been removing skin from chicken. Criss is doing well with his diet.  He has cut back on bread and sodium in general.  His weakness is yellow tomato sandwiches when he can find them.  He is trying to eat more fruits and vegetables.  His goal is to eat healthier in general.  He continues to work on his portions and cutting back.  He usually will order a veggie plate  now when they go out to eat. Quayshawn is doing well in rehab.  He continues to work on his diet.  He is making the best of it.  He has been busy recently, with funerals and not eating set meals.  He is still sticking to veggie plates.  He has not cut back on portion sizes since he has been limited in his meals.  He is getting it all in one versus three meals.  He does keep grapes in the fridge to grad and go. Horton is doing better with his diet.  They are trying out new recipes too.  Last night they tried rutabagas for the first time baked and boiled and he really enjoyed them.  They are eating out less and pleased to also being able to also save money too.    Expected Outcome Short: continue to to limit sodium and fat. Long: continue to make heart healthy dietary choices to help control risk factors. Short:  Continue to work on portion sizes Long: conitnue to focus on heart healthy eating Short: Try to eat throughout day versus skipping meals Long: Conitnue to work on Medical illustrator; Continue to try out new foods Long: Continue to focus on heart healhty eating             Nutrition Goals Discharge (Final Nutrition Goals Re-Evaluation):  Nutrition Goals Re-Evaluation - 06/11/22 1058       Goals   Nutrition Goal Short: Try to eat throughout day versus skipping meals Long: Conitnue to work on healthier eating    Comment Yeico is doing better with his diet.  They are trying out new recipes too.  Last night they tried rutabagas for the first time baked and boiled and he really enjoyed them.  They  are eating out less and pleased to also being able to also save money too.    Expected Outcome Short; Continue to try out new foods Long: Continue to focus on heart healhty eating             Psychosocial: Target Goals: Acknowledge presence or absence of significant depression and/or stress, maximize coping skills, provide positive support system. Participant is able to verbalize types and ability to  use techniques and skills needed for reducing stress and depression.   Education: Stress, Anxiety, and Depression - Group verbal and visual presentation to define topics covered.  Reviews how body is impacted by stress, anxiety, and depression.  Also discusses healthy ways to reduce stress and to treat/manage anxiety and depression.  Written material given at graduation. Flowsheet Row Cardiac Rehab from 06/18/2022 in [facility] Cardiac and Pulmonary Rehab  Date 06/18/22  Educator [facility]  Instruction Review Code 1- Verbalizes Understanding       Education: Sleep Hygiene -Provides group verbal and written instruction about how sleep can affect your health.  Define sleep hygiene, discuss sleep cycles and impact of sleep habits. Review good sleep hygiene tips.    Initial Review & Psychosocial Screening:  Initial Psych Review & Screening - 02/19/22 1437       Initial Review   Current issues with None Identified      Family Dynamics   Good Support System? Yes    Comments His wife is his support system. He has done the program before and is ready to get healthier.      Barriers   Psychosocial barriers to participate in program There are no identifiable barriers or psychosocial needs.;The patient should benefit from training in stress management and relaxation.      Screening Interventions   Interventions Encouraged to exercise;To provide support and resources with identified psychosocial needs;Provide feedback about the scores to participant    Expected Outcomes Short Term goal: Utilizing psychosocial counselor, staff and physician to assist with identification of specific Stressors or current issues interfering with healing process. Setting desired goal for each stressor or current issue identified.;Short Term goal: Identification and review with participant of any Quality of Life or Depression concerns found by scoring the questionnaire.;Long Term Goal: Stressors or current issues are controlled or  eliminated.;Long Term goal: The participant improves quality of Life and PHQ9 Scores as seen by post scores and/or verbalization of changes             Quality of Life Scores:   Quality of Life - 06/20/22 0938       Quality of Life Scores   Health/Function Pre 17.5 %    Health/Function Post 20.9 %    Health/Function % Change 19.43 %    Socioeconomic Pre 19.71 %    Socioeconomic Post 22.5 %    Socioeconomic % Change  14.16 %    Psych/Spiritual Pre 22.79 %    Psych/Spiritual Post 23.07 %    Psych/Spiritual % Change 1.23 %    Family Pre 18 %    Family Post 23 %    Family % Change 27.78 %    GLOBAL Pre 19.12 %    GLOBAL Post 22.01 %    GLOBAL % Change 15.12 %            Scores of 19 and below usually indicate a poorer quality of life in these areas.  A difference of  2-3 points is a clinically meaningful difference.  A difference of 2-3  points in the total score of the Quality of Life Index has been associated with significant improvement in overall quality of life, self-image, physical symptoms, and general health in studies assessing change in quality of life.  PHQ-9: Review Flowsheet       06/20/2022 03/03/2022 10/27/2019 02/26/2012  Depression screen PHQ 2/9  Decreased Interest 0 0 0 0  Down, Depressed, Hopeless 0 0 0 0  PHQ - 2 Score 0 0 0 0  Altered sleeping 0 0 0 -  Tired, decreased energy 1 0 0 -  Change in appetite 0 0 0 -  Feeling bad or failure about yourself  0 0 0 -  Trouble concentrating 0 0 0 -  Moving slowly or fidgety/restless 0 0 0 -  Suicidal thoughts 0 0 0 -  PHQ-9 Score 1 0 0 -  Difficult doing work/chores Not difficult at all Not difficult at all Not difficult at all -   Interpretation of Total Score  Total Score Depression Severity:  1-4 = Minimal depression, 5-9 = Mild depression, 10-14 = Moderate depression, 15-19 = Moderately severe depression, 20-27 = Severe depression   Psychosocial Evaluation and Intervention:  Psychosocial Evaluation  - 02/19/22 1438       Psychosocial Evaluation & Interventions   Interventions Stress management education;Relaxation education;Encouraged to exercise with the program and follow exercise prescription    Comments His wife is his support system. He has done the program before and is ready to get healthier.    Expected Outcomes Short: attend cardiac rehab for exercise and education. Long: develop positive self care habits.    Continue Psychosocial Services  Follow up required by staff             Psychosocial Re-Evaluation:  Psychosocial Re-Evaluation     Success Name 03/24/22 [phone] 04/18/22 0926 05/07/22 0951 06/11/22 1057       Psychosocial Re-Evaluation   Current issues with None Identified Current Stress Concerns Current Stress Concerns Current Stress Concerns    Comments Patient reports no new mental health or sleep concerns. Brayln is doing well in rehab.  He had a traffic violation and the lawyer was able to get it lowered penalty.  He just has pay the fines and court fees. He is doing well mentally. He is doing well sleeping on his BiPap and is compliant with using it nightly. Babe is doing well in rehab.  He has had several funerals to attend recently, which is hard on him.  He is still sleeping pretty good for most part.  He usually is a night owl and stays up until 2am as he used to work second shift and it has just became a habit.  He is doing his best to stay positive with losing his friends, but he is making the best of it. Macson is doing well in rehab.  He is feeling good mentally and still sleeping well.  His biggest concern recently has been that he feels like his memory is starting to slip as he forget things and what he was doing. He is also doing puzzle games to keep his brain active.    Expected Outcomes Short: continue to attend cardiac rehab and exercise consistently to benefit mental health. Long: mantain good mental health habits. Short: continue to focus on positive Long:  continued compliance with BiPap Short: Continue to sleep when he can Long: Continue to focus on the good. 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 doctor about it Long; Continue  to stay positive    Interventions Encouraged to attend Cardiac Rehabilitation for the exercise Encouraged to attend Cardiac Rehabilitation for the exercise Encouraged to attend Cardiac Rehabilitation for the exercise Encouraged to attend Cardiac Rehabilitation for the exercise    Continue Psychosocial Services  Follow up required by staff Follow up required by staff Follow up required by staff --             Psychosocial Discharge (Final Psychosocial Re-Evaluation):  Psychosocial Re-Evaluation - 06/11/22 1057       Psychosocial Re-Evaluation   Current issues with Current Stress Concerns    Comments Aarjav is doing well in rehab.  He is feeling good mentally and still sleeping well.  His biggest concern recently has been that he feels like his memory is starting to slip as he forget things and what he was doing. He is also doing puzzle games to keep his brain active.    Expected Outcomes Short: Continue to work on memory and talk to doctor about it Long; Continue to stay positive    Interventions Encouraged to attend Cardiac Rehabilitation for the exercise             Vocational Rehabilitation: Provide vocational rehab assistance to qualifying candidates.   Vocational Rehab Evaluation & Intervention:  Vocational Rehab - 06/20/22 [id number]       Initial Vocational Rehab Evaluation & Intervention   Assessment shows need for Vocational Rehabilitation No      Discharge Vocational Rehab   Discharge Vocational Rehabilitation retired             Education: Education Goals: Education classes will be provided on a variety of topics geared toward better understanding of heart health and risk factor modification. Participant will state understanding/return demonstration of topics presented as noted by  education test scores.  Learning Barriers/Preferences:  Learning Barriers/Preferences - 02/19/22 1436       Learning Barriers/Preferences   Learning Barriers None    Learning Preferences None             General Cardiac Education Topics:  AED/CPR: - Group verbal and written instruction with the use of models to demonstrate the basic use of the AED with the basic ABC's of resuscitation.   Anatomy and Cardiac Procedures: - Group verbal and visual presentation and models provide information about basic cardiac anatomy and function. Reviews the testing methods done to diagnose heart disease and the outcomes of the test results. Describes the treatment choices: Medical Management, Angioplasty, or Coronary Bypass Surgery for treating various heart conditions including Myocardial Infarction, Angina, Valve Disease, and Cardiac Arrhythmias.  Written material given at graduation. Flowsheet Row Cardiac Rehab from 06/18/2022 in [facility] Cardiac and Pulmonary Rehab  Date 03/12/22  Educator [facility]  Instruction Review Code 1- Verbalizes Understanding       Medication Safety: - Group verbal and visual instruction to review commonly prescribed medications for heart and lung disease. Reviews the medication, class of the drug, and side effects. Includes the steps to properly store meds and maintain the prescription regimen.  Written material given at graduation.   Intimacy: - Group verbal instruction through game format to discuss how heart and lung disease can affect sexual intimacy. Written material given at graduation..   Know Your Numbers and Heart Failure: - Group verbal and visual instruction to discuss disease risk factors for cardiac and pulmonary disease and treatment options.  Reviews associated critical values for Overweight/Obesity, Hypertension, Cholesterol, and Diabetes.  Discusses basics of heart failure: signs/symptoms  and treatments.  Introduces Heart Failure Zone chart for action plan  for heart failure.  Written material given at graduation. Flowsheet Row Cardiac Rehab from 06/18/2022 in [facility] Cardiac and Pulmonary Rehab  Education need identified 03/03/22       Infection Prevention: - Provides verbal and written material to individual with discussion of infection control including proper hand washing and proper equipment cleaning during exercise session. Flowsheet Row Cardiac Rehab from 06/18/2022 in [facility] Cardiac and Pulmonary Rehab  Date 02/19/22  Educator [facility]  Instruction Review Code 1- Verbalizes Understanding       Falls Prevention: - Provides verbal and written material to individual with discussion of falls prevention and safety. Flowsheet Row Cardiac Rehab from 06/18/2022 in [facility] Cardiac and Pulmonary Rehab  Date 02/19/22  Educator [facility]  Instruction Review Code 1- Verbalizes Understanding       Other: -Provides group and verbal instruction on various topics (see comments)   Knowledge Questionnaire Score:  Knowledge Questionnaire Score - 06/20/22 [id number]       Knowledge Questionnaire Score   Pre Score 22/26    Post Score 21/26  answers reviewed  he verbalized understanding             Core Components/Risk Factors/Patient Goals at Admission:  Personal Goals and Risk Factors at Admission - 03/03/22 1515       Core Components/Risk Factors/Patient Goals on Admission    Weight Management Yes;Weight Loss;Obesity    Intervention Weight Management: Develop a combined nutrition and exercise program designed to reach desired caloric intake, while maintaining appropriate intake of nutrient and fiber, sodium and fats, and appropriate energy expenditure required for the weight goal.;Weight Management: Provide education and appropriate resources to help participant work on and attain dietary goals.;Weight Management/Obesity: Establish reasonable short term and long term weight goals.;Obesity: Provide education and appropriate resources to help participant work on  and attain dietary goals.    Admit Weight 213 lb 4.8 oz (96.8 kg)    Goal Weight: Short Term 210 lb (95.3 kg)    Goal Weight: Long Term 205 lb (93 kg)    Expected Outcomes Short Term: Continue to assess and modify interventions until short term weight is achieved;Long Term: Adherence to nutrition and physical activity/exercise program aimed toward attainment of established weight goal;Weight Loss: Understanding of general recommendations for a balanced deficit meal plan, which promotes 1-2 lb weight loss per week and includes a negative energy balance of [phone] kcal/d;Understanding recommendations for meals to include 15-35% energy as protein, 25-35% energy from fat, 35-60% energy from carbohydrates, less than 200mg  of dietary cholesterol, 20-35 gm of total fiber daily;Understanding of distribution of calorie intake throughout the day with the consumption of 4-5 meals/snacks    Improve shortness of breath with ADL's Yes    Intervention Provide education, individualized exercise plan and daily activity instruction to help decrease symptoms of SOB with activities of daily living.    Expected Outcomes Short Term: Improve cardiorespiratory fitness to achieve a reduction of symptoms when performing ADLs;Long Term: Be able to perform more ADLs without symptoms or delay the onset of symptoms    Diabetes Yes    Intervention Provide education about signs/symptoms and action to take for hypo/hyperglycemia.;Provide education about proper nutrition, including hydration, and aerobic/resistive exercise prescription along with prescribed medications to achieve blood glucose in normal ranges: Fasting glucose 65-99 mg/dL    Expected Outcomes Short Term: Participant verbalizes understanding of the signs/symptoms and immediate care of hyper/hypoglycemia, proper foot care and importance of  medication, aerobic/resistive exercise and nutrition plan for blood glucose control.;Long Term: Attainment of HbA1C < 7%.     Hypertension Yes    Intervention Provide education on lifestyle modifcations including regular physical activity/exercise, weight management, moderate sodium restriction and increased consumption of fresh fruit, vegetables, and low fat dairy, alcohol moderation, and smoking cessation.;Monitor prescription use compliance.    Expected Outcomes Short Term: Continued assessment and intervention until BP is < 140/70mm HG in hypertensive participants. < 130/75mm HG in hypertensive participants with diabetes, heart failure or chronic kidney disease.;Long Term: Maintenance of blood pressure at goal levels.    Lipids Yes    Intervention Provide education and support for participant on nutrition & aerobic/resistive exercise along with prescribed medications to achieve LDL 70mg , HDL >40mg .    Expected Outcomes Short Term: Participant states understanding of desired cholesterol values and is compliant with medications prescribed. Participant is following exercise prescription and nutrition guidelines.;Long Term: Cholesterol controlled with medications as prescribed, with individualized exercise RX and with personalized nutrition plan. Value goals: LDL < 70mg , HDL > 40 mg.             Education:Diabetes - Individual verbal and written instruction to review signs/symptoms of diabetes, desired ranges of glucose level fasting, after meals and with exercise. Acknowledge that pre and post exercise glucose checks will be done for 3 sessions at entry of program. Preston from 06/18/2022 in [facility]  Date 03/03/22  Educator [facility]  Instruction Review Code 1- Verbalizes Understanding       Core Components/Risk Factors/Patient Goals Review:   Goals and Risk Factor Review     Row Name 03/24/22 0934 04/18/22 0932 05/07/22 0957 06/11/22 1102       Core Components/Risk Factors/Patient Goals Review   Personal Goals Review Weight Management/Obesity;Hypertension;Improve  shortness of breath with ADL's;Diabetes;Lipids Weight Management/Obesity;Hypertension;Improve shortness of breath with ADL's;Diabetes;Lipids Weight Management/Obesity;Hypertension;Improve shortness of breath with ADL's;Diabetes;Lipids Weight Management/Obesity;Hypertension;Improve shortness of breath with ADL's;Diabetes;Lipids    Review Patient reports that he monitors his blood sugar and blood presure at home. He takes all meds as prescribed. Diabetes is still diet controlled and he reports that his blood sugar numbers are good. Daesean is doing well in rehab.  His weight is staying steady and he is feeling good.  His pressures are doing well and he checks on occassion at home.  His sugars vary between 105-115 each week.  He usually checks both on Monday mornings.  His wife is also diabetic so they both watch what they are eating.  He is breathing a little better overall.  He has found that he is only getting short of breath when he works harder. Aja is doing well in rehab.  His sugars are doing well and staying balanced.  His wife continues to struggle with blood sugar control so he is grateful that his are good.  His pressures are doing well usuially 120s/60s when he checks them at home.  His weight is staying steady and he would like to lost some more. Sumedh is doing well in rehab. His sugars are staying good and averaging between 100-115 mg/dl.  He will usually check them in the mornings on Mondays.  His pressures continues to do well and his breathing has improved as well.  His weight is starting to come down which makes him happy.    Expected Outcomes Short: continue to take all meds and continue to monitor blood sugars and blood pressures at home. Long: continue to  work on weight loss goal. Short: Conitnue to keep close eye on sugars Long: conitnue to monitor risk factors Short: Continue to work on weight loss Long: Conitnue to monitor risk factors and manage diabetes Short: continue to watch sugars Long:  Continue to work on weight loss             Core Components/Risk Factors/Patient Goals at Discharge (Final Review):   Goals and Risk Factor Review - 06/11/22 1102       Core Components/Risk Factors/Patient Goals Review   Personal Goals Review Weight Management/Obesity;Hypertension;Improve shortness of breath with ADL's;Diabetes;Lipids    Review Josuel is doing well in rehab. His sugars are staying good and averaging between 100-115 mg/dl.  He will usually check them in the mornings on Mondays.  His pressures continues to do well and his breathing has improved as well.  His weight is starting to come down which makes him happy.    Expected Outcomes Short: continue to watch sugars Long: Continue to work on weight loss             ITP Comments:  ITP Comments     Row Name 02/19/22 1440 03/03/22 1507 03/05/22 0836 03/10/22 1038 04/02/22 1100   ITP Comments Virtual Visit completed. Patient informed on EP and RD appointment and 6 Minute walk test. Patient also informed of patient health questionnaires on My Chart. Patient Verbalizes understanding. Visit diagnosis can be found in [facility] 02/02/2022. Completed 6MWT and gym orientation. Initial ITP created and sent for review to Dr. Emily Filbert, Medical Director. 30 Day review completed. Medical Director ITP review done, changes made as directed, and signed approval by Medical Director.   new to program First full day of exercise!  Patient was oriented to gym and equipment including functions, settings, policies, and procedures.  Patient's individual exercise prescription and treatment plan were reviewed.  All starting workloads were established based on the results of the 6 minute walk test done at initial orientation visit.  The plan for exercise progression was also introduced and progression will be customized based on patient's performance and goals. 30 Day review completed. Medical Director ITP review done, changes made as directed, and signed  approval by Medical Director.     new to prgram    Row Name 04/30/22 1031 05/28/22 1450 06/23/22 0931       ITP Comments 30 Day review completed. Medical Director ITP review done, changes made as directed, and signed approval by Medical Director. 30 day review completed. ITP sent to Dr. Emily Filbert, Medical Director of Cardiac Rehab. Continue with ITP unless changes are made by physician. Damonn graduated today from  rehab with 36 sessions completed.  Details of the patient's exercise prescription and what He needs to do in order to continue the prescription and progress were discussed with patient.  Patient was given a copy of prescription and goals.  Patient verbalized understanding.  Kelin plans to continue to exercise by walking and staff videos.              Comments: Discharge ITP

## 2022-06-23 NOTE — Progress Notes (Signed)
Discharge Summary: Chase Keller (DOB: [birth year])  Chase Keller graduated today from  rehab with 36 sessions completed.  Details of the patient's exercise prescription and what He needs to do in order to continue the prescription and progress were discussed with patient.  Patient was given a copy of prescription and goals.  Patient verbalized understanding.  Chase Keller plans to continue to exercise by walking and staff videos.   [place] Name 03/03/22 1507 06/13/22 0950       6 Minute Walk   Phase Initial Discharge    Distance 1040 feet 1150 feet    Distance % Change -- 10.6 %    Distance Feet Change -- 110 ft    Walk Time 6 minutes 6 minutes    # of Rest Breaks 0 0    MPH 197 2.18    METS 1.12 1.66    RPE 11 12    Perceived Dyspnea  2 2.5    VO2 Peak 5.31 5.84    Symptoms Yes (comment) Yes (comment)    Comments SOB SOB    Resting HR 61 bpm 56 bpm    Resting BP 128/64 126/62    Resting Oxygen Saturation  96 % 96 %    Exercise Oxygen Saturation  during 6 min walk 97 % 94 %    Max Ex. HR 105 bpm 89 bpm    Max Ex. BP 136/74 156/74    2 Minute Post BP 122/60 --

## 2023-05-21 ENCOUNTER — Ambulatory Visit
Admission: EM | Admit: 2023-05-21 | Discharge: 2023-05-21 | Disposition: A | Discharge status: Home / Self Care | Payer: No Typology Code available for payment source | Attending: Emergency Medicine | Admitting: Emergency Medicine

## 2023-05-21 ENCOUNTER — Encounter: Payer: Self-pay | Admitting: Emergency Medicine

## 2023-05-21 DIAGNOSIS — J09X2 Influenza due to identified novel influenza A virus with other respiratory manifestations: Secondary | ICD-10-CM | POA: Insufficient documentation

## 2023-05-21 LAB — RESP PANEL BY RT-PCR (RSV, FLU A&B, COVID)  RVPGX2
Influenza A by PCR: POSITIVE — AB
Influenza B by PCR: NEGATIVE
Resp Syncytial Virus by PCR: NEGATIVE
SARS Coronavirus 2 by RT PCR: NEGATIVE

## 2023-05-21 MED ORDER — IPRATROPIUM BROMIDE 0.06 % NA SOLN: 2.0000 | Freq: Four times a day (QID) | NASAL | 12 refills | Status: AC

## 2023-05-21 MED ORDER — BENZONATATE 100 MG PO CAPS: 200.0000 mg | ORAL_CAPSULE | Freq: Three times a day (TID) | ORAL | 0 refills | Status: AC

## 2023-05-21 MED ORDER — OSELTAMIVIR PHOSPHATE 75 MG PO CAPS: 75.0000 mg | ORAL_CAPSULE | Freq: Two times a day (BID) | ORAL | 0 refills | Status: AC

## 2023-05-21 MED ORDER — PROMETHAZINE-DM 6.25-15 MG/5ML PO SYRP: 5.0000 mL | ORAL_SOLUTION | Freq: Four times a day (QID) | ORAL | 0 refills | Status: AC | PRN

## 2023-05-21 NOTE — ED Provider Notes (Signed)
[facility]    CSN: [id number] Arrival date & time: 05/21/23  1454      History   Chief Complaint Chief Complaint  Patient presents with   Nasal Congestion   Cough   Generalized Body Aches   Fever    HPI Chase Keller is a 83 y.o. male.   HPI  83 year old male with past medical history significant for atrial fibrillation, type 2 diabetes, CAD, hypertension, and obstructive sleep apnea presents for evaluation of respiratory symptoms that started 2 to 3 days ago.  He has not measured a fever at home but states that he feels like he is about to freeze and has been experiencing chills.  He has had nasal congestion and runny nose for clear discharge.  He is endorsing a significantly sore throat and nonproductive cough.  He also Dors is shortness of breath but states that that is not a new symptom.  He denies any ear pain or wheezing.  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   CHF (congestive heart failure) (HCC)    COPD (chronic obstructive pulmonary disease) (HCC)    Coronary artery disease    Diabetes mellitus without complication (HCC)    diet controlled   Dysrhythmia     A fib   Heart murmur    / 1 MD said yes, 1 said no   History of blood transfusion    during CABG   History of kidney stones    Hyperlipidemia    Hypertension    Left knee DJD    MRSA (methicillin resistant staph aureus) culture positive    Drainage from Left Knee approx 2005   Myocardial infarction (HCC)    mild in 1998 per Wife    PONV (postoperative nausea and vomiting)    S/P coronary artery bypass graft x 2    1998   Shortness of breath    Sleep apnea    CPAP   Stented coronary artery     Patient Active Problem List   Diagnosis Date Noted   Primary localized osteoarthritis of right knee 06/08/2020   Pain due to onychomycosis of toenails of both feet 11/08/2018   Pincer nail deformity  11/08/2018   Coagulation disorder (HCC) 11/08/2018   Diabetes mellitus type 2, diet-controlled (HCC) 06/29/2012   Atrial fibrillation (HCC)    Elevated fasting glucose 02/25/2012   Long term (current) use of anticoagulants 02/25/2012   CHF (congestive heart failure) (HCC) 02/24/2012   Cough 02/24/2012   Shortness of breath 02/24/2012   Incomplete right bundle branch block 10/14/2011   Leukocytosis 10/14/2011   Bradycardia 10/13/2011   Left knee DJD    Coronary artery disease    Stented coronary artery    S/P coronary artery bypass graft x 2    Obesity (BMI 30-39.9) 09/01/2011   Hypertension 04/06/2011   Obstructive sleep apnea 04/04/2011   Hypothyroidism 03/29/2011   Hyperlipidemia 03/29/2011    Past Surgical History:  Procedure Laterality Date   Bone spurs     Back   Cardiac stents  2005   CORONARY ARTERY BYPASS GRAFT  1988   2 vessel,  followed by [facility]   NASAL SINUS SURGERY     Due to sinus "impaction"   PTCA  2005   [name]   skin moles biopsied     TOTAL KNEE ARTHROPLASTY  10/13/2011   Procedure: TOTAL  KNEE ARTHROPLASTY;  Surgeon: Nilda Simmer, MD;  Location: [facility];  Service: Orthopedics;  Laterality: Left;  left total knee arthroplasty   TOTAL KNEE ARTHROPLASTY Right 06/08/2020   Procedure: TOTAL KNEE ARTHROPLASTY;  Surgeon: Frederico Hamman, MD;  Location: [facility] ORS;  Service: Orthopedics;  Laterality: Right;       Home Medications    Prior to Admission medications   Medication Sig Start Date End Date Taking? Authorizing Provider  benzonatate (TESSALON) 100 MG capsule Take 2 capsules (200 mg total) by mouth every 8 (eight) hours. 05/21/23  Yes Becky Augusta, NP  ipratropium (ATROVENT) 0.06 % nasal spray Place 2 sprays into both nostrils 4 (four) times daily. 05/21/23  Yes Becky Augusta, NP  oseltamivir (TAMIFLU) 75 MG capsule Take 1 capsule (75 mg total) by mouth every 12 (twelve) hours. 05/21/23  Yes Becky Augusta, NP  promethazine-dextromethorphan  (PROMETHAZINE-DM) 6.25-15 MG/5ML syrup Take 5 mLs by mouth 4 (four) times daily as needed. 05/21/23  Yes Becky Augusta, NP  ACAI BERRY PO Take 3,000 mg by mouth daily.    [provider]  albuterol (VENTOLIN HFA) 108 (90 Base) MCG/ACT inhaler Inhale 2 puffs into the lungs every 6 (six) hours as needed for wheezing or shortness of breath.    [provider]  ALFALFA PO Take 1,300 mg by mouth 2 (two) times daily.    [provider]  allopurinol (ZYLOPRIM) 300 MG tablet Take 300 mg by mouth daily.    [provider]  amLODipine (NORVASC) 2.5 MG tablet Take 2.5 mg by mouth daily as needed (If systolic BP over 161.). 01/15/12   Sherlene Shams, MD  ammonium lactate (LAC-HYDRIN) 12 % lotion Apply 1 application topically daily as needed for dry skin.    [provider]  ammonium lactate (LAC-HYDRIN) 12 % lotion APPLY SMALL AMOUNT TOPICALLY IN THE MORNING FOR DRY SKIN Patient not taking: Reported on 02/19/2022 10/01/21   [provider]  apixaban (ELIQUIS) 5 MG TABS tablet TAKE ONE TABLET BY MOUTH EVERY 12 HOURS CAUTION BLOOD THINNER THIS REPLACES YOUR WARFARIN 05/24/21   [provider]  APPLE CIDER VINEGAR PO Take 480 mg by mouth daily.    [provider]  Ascorbic Acid (VITAMIN C) 1000 MG tablet Take 1,000 mg by mouth daily.    [provider]  aspirin EC 81 MG tablet Take 81 mg by mouth daily.    [provider]  Calcium Carb-Cholecalciferol (CALCIUM/VITAMIN D) 600-400 MG-UNIT TABS Take 1 tablet by mouth 2 (two) times daily. Patient not taking: Reported on 02/19/2022    [provider]  Calcium Carbonate-Vit D-Min (CALCIUM 600+D PLUS MINERALS) 600-400 MG-UNIT TABS Take 1 tablet by mouth 2 (two) times daily.    [provider]  carvedilol (COREG) 6.25 MG tablet Take by mouth 2 (two) times daily with a meal.    [provider]  Cholecalciferol (VITAMIN D3) 250 MCG (10000 UT) capsule Take 10,000  Units by mouth daily.    [provider]  Cholecalciferol 125 MCG (5000 UT) TABS Take by mouth.    [provider]  Cinnamon 500 MG capsule Take 500 mg by mouth daily.    [provider]  Coenzyme Q10 200 MG capsule Take 200 mg by mouth daily.    [provider]  cyanocobalamin 100 MCG tablet TAKE ONE TABLET BY MOUTH EVERY DAY TO PREVENT VITAMIN B12 DEFICIENCY Patient not taking: Reported on 02/19/2022 05/17/21   [provider]  cyanocobalamin 1000  MCG tablet Take 1,000 mcg by mouth daily.    [provider]  ezetimibe (ZETIA) 10 MG tablet Take 10 mg by mouth daily.    [provider]  ferrous sulfate 325 (65 FE) MG tablet Take 325 mg by mouth daily with breakfast.    [provider]  fluticasone (FLONASE) 50 MCG/ACT nasal spray Place 2 sprays into both nostrils daily as needed for rhinitis. Patient not taking: Reported on 02/19/2022 02/24/12   [provider]  fluticasone (FLONASE) 50 MCG/ACT nasal spray Place into the nose. 02/24/12   [provider]  Glucosamine-Chondroit-Vit C-Mn (GLUCOSAMINE CHONDR 500 COMPLEX) CAPS Take 2 capsules by mouth 2 (two) times daily.    [provider]  lidocaine (LIDODERM) 5 % Place onto the skin. 05/01/21   [provider]  MAGNESIUM PO Take by mouth.    [provider]  niacin 500 MG CR capsule Take 500 mg by mouth at bedtime. B3    [provider]  Potassium 99 MG TABS Take 99 mg by mouth 2 (two) times daily.    [provider]  potassium gluconate 595 (99 K) MG TABS tablet Take by mouth.    [provider]  pyridOXINE (B-6) 50 MG tablet Take 100 mg by mouth daily.    [provider]  pyridOXINE (B-6) 50 MG tablet Take 2 tablets by mouth daily. Patient not taking: Reported on 02/19/2022 05/13/21   [provider]  RED YEAST RICE EXTRACT PO Take 600 mg by mouth 2 (two) times daily.    [provider]  riboflavin (VITAMIN B-2) 100 MG TABS tablet Take 100 mg by mouth daily.    [provider]  senna-docusate (SENOKOT-S) 8.6-50 MG tablet Take 1 tablet by mouth 2 (two) times daily.    [provider]  Skin Protectants, Misc. (HYDROCERIN EX) Apply 1 application topically daily as needed (dry skin).    [provider]  Skin Protectants, Misc. (MINERIN CREME EX) Apply topically.    [provider]  spironolactone (ALDACTONE) 25 MG tablet Take 12.5 mg by mouth daily. Patient not taking: Reported on 02/19/2022    [provider]  spironolactone (ALDACTONE) 25 MG tablet Take 0.5 tablets by mouth daily. 06/17/21   [provider]  tamsulosin (FLOMAX) 0.4 MG CAPS capsule Take 0.4 mg by mouth at bedtime. Patient not taking: Reported on 02/19/2022    [provider]  tamsulosin (FLOMAX) 0.4 MG CAPS capsule Take 1 capsule by mouth at bedtime. 10/10/20   [provider]  thiamine 100 MG tablet Take 100 mg by mouth daily.    [provider]  traMADol (ULTRAM) 50 MG tablet Take 50 mg by mouth every 6 (six) hours as needed for moderate pain.    [provider]  trospium (SANCTURA) 20 MG tablet Take 20 mg by mouth 2 (two) times daily.    [provider]  warfarin (COUMADIN) 1 MG tablet Take 3 mg by mouth at bedtime. 04/11/12   [provider]  zinc gluconate 50 MG tablet Take 50 mg by mouth daily.    [provider]    Family History Family History  Problem Relation Age of Onset   Heart disease Mother    Heart attack Father    Heart attack Brother        died of an MI after total knee   Heart disease Brother        multiple bypass surgery  Social History Social History   Tobacco Use   Smoking status: Never   Smokeless tobacco: Never   Tobacco comments:    father smoked in the home while pt was growing up//ex-wife smoked  Vaping Use   Vaping status: Never Used   Substance Use Topics   Alcohol use: No   Drug use: No     Allergies   Ace inhibitors, Armodafinil, Hydromorphone, Rosuvastatin, Statins, Tomato, Chocolate, Cocoa, Other, and Oxycodone   Review of Systems Review of Systems  Constitutional:  Negative for fever.  HENT:  Positive for congestion, rhinorrhea and sore throat. Negative for ear pain.   Respiratory:  Positive for cough and shortness of breath. Negative for wheezing.      Physical Exam Triage Vital Signs ED Triage Vitals  Encounter Vitals Group     BP      Systolic BP Percentile      Diastolic BP Percentile      Pulse      Resp      Temp      Temp src      SpO2      Weight      Height      Head Circumference      Peak Flow      Pain Score      Pain Loc      Pain Education      Exclude from Growth Chart    No data found.  Updated Vital Signs BP 132/66 (BP Location: Right Arm)   Pulse 70   Temp 99 F (37.2 C) (Oral)   Resp 18   SpO2 94%   Visual Acuity Right Eye Distance:   Left Eye Distance:   Bilateral Distance:    Right Eye Near:   Left Eye Near:    Bilateral Near:     Physical Exam Vitals and nursing note reviewed.  Constitutional:      Appearance: Normal appearance. He is not ill-appearing.  HENT:     Head: Normocephalic and atraumatic.     Right Ear: Tympanic membrane, ear canal and external ear normal. There is no impacted cerumen.     Left Ear: Tympanic membrane, ear canal and external ear normal. There is no impacted cerumen.     Nose: Congestion and rhinorrhea present.     Comments: Nasal mucosa is edematous with clear discharge in both nares.    Mouth/Throat:     Mouth: Mucous membranes are moist.     Pharynx: Oropharynx is clear. No oropharyngeal exudate or posterior oropharyngeal erythema.  Cardiovascular:     Rate and Rhythm: Normal rate and regular rhythm.     Pulses: Normal pulses.     Heart sounds: Normal heart sounds. No murmur heard.    No friction rub. No gallop.   Pulmonary:     Effort: Pulmonary effort is normal.     Breath sounds: Normal breath sounds. No wheezing, rhonchi or rales.  Musculoskeletal:     Cervical back: Normal range of motion and neck supple. No tenderness.  Lymphadenopathy:     Cervical: No cervical adenopathy.  Skin:    General: Skin is warm and dry.     Capillary Refill: Capillary refill takes less than 2 seconds.     Findings: No rash.  Neurological:     General: No focal deficit present.     Mental Status: He is alert and oriented to person, place, and time.      UC Treatments / Results  Labs (all labs ordered are listed, but only abnormal results are displayed) Labs Reviewed  RESP PANEL BY RT-PCR (RSV, FLU A&B, COVID)  RVPGX2 - Abnormal; Notable for the following components:      Result Value   Influenza A by PCR POSITIVE (*)    All other components within normal limits    EKG   Radiology No results found.  Procedures Procedures (including critical care time)  Medications Ordered in UC Medications - No data to display  Initial Impression / Assessment and Plan / UC Course  I have reviewed the triage vital signs and the nursing notes.  Pertinent labs & imaging results that were available during my care of the patient were reviewed by me and considered in my medical decision making (see chart for details).   Patient is a nontoxic-appearing 83 year old male presenting for evaluation of 2 to 3 days with the respiratory symptoms as outlined HPI above.  He states that he feels short of breath but he is able to speak in full sentence with dyspnea or tachypnea in the exam room.  His lungs are clear to auscultation all fields.  His respiratory rate at triage was 18 with a 94% room air oxygen saturation.  Temperature is mildly elevated at 99.  Given patient's cluster of symptoms I will order a COVID, influenza, and RSV PCR.  Respiratory positive for influenza A.  Given patient's significant past medical history,  despite having had symptoms for 3 days, I will discharge him home on Tamiflu 75 mg twice daily for the flu.  He may use over-the-counter Tylenol according to package instructions as needed for fever or pain.  Atrovent nasal spray for his nasal congestion.  Tessalon Perles and promethazine cough syrup for cough and congestion.  Return precaution reviewed.   Final Clinical Impressions(s) / UC Diagnoses   Final diagnoses:  Influenza due to identified novel influenza A virus with other respiratory manifestations     Discharge Instructions      Take the Tamiflu twice daily for 5 days for treatment of influenza.  Use over-the-counter Tylenol according the package instructions as needed for any body aches or fever.  Use the Atrovent nasal spray, 2 squirts up each nostril every 6 hours, as needed for nasal congestion and runny nose.  Use over-the-counter Delsym, Zarbee's, or Robitussin during the day as needed for cough.  Use the Tessalon Perles every 8 hours as needed for cough.  Taken with a small sip of water.  You may experience some numbness to your tongue or metallic taste in your mouth, this is normal.  Use the Promethazine DM cough syrup at bedtime as will make you drowsy but it should help dry up your postnasal drip and aid you in sleep and cough relief.  Return for reevaluation, or see your primary care provider, for new or worsening symptoms.      ED Prescriptions     Medication Sig Dispense Auth. Provider   benzonatate (TESSALON) 100 MG capsule Take 2 capsules (200 mg total) by mouth every 8 (eight) hours. 21 capsule Becky Augusta, NP   ipratropium (ATROVENT) 0.06 % nasal spray Place 2 sprays into both nostrils 4 (four) times daily. 15 mL Becky Augusta, NP   promethazine-dextromethorphan (PROMETHAZINE-DM) 6.25-15 MG/5ML syrup Take 5 mLs by mouth 4 (four) times daily as needed. 118 mL Becky Augusta, NP   oseltamivir (TAMIFLU) 75 MG capsule Take 1 capsule (75 mg total) by mouth  every 12 (twelve) hours. 10 capsule Becky Augusta,  NP      PDMP not reviewed this encounter.   Becky Augusta, NP 05/21/23 [phone]

## 2023-05-21 NOTE — Discharge Instructions (Addendum)
Take the Tamiflu twice daily for 5 days for treatment of influenza.  Use over-the-counter Tylenol according the package instructions as needed for any body aches or fever.  Use the Atrovent nasal spray, 2 squirts up each nostril every 6 hours, as needed for nasal congestion and runny nose.  Use over-the-counter Delsym, Zarbee's, or Robitussin during the day as needed for cough.  Use the Tessalon Perles every 8 hours as needed for cough.  Taken with a small sip of water.  You may experience some numbness to your tongue or metallic taste in your mouth, this is normal.  Use the Promethazine DM cough syrup at bedtime as will make you drowsy but it should help dry up your postnasal drip and aid you in sleep and cough relief.  Return for reevaluation, or see your primary care provider, for new or worsening symptoms.

## 2023-05-21 NOTE — ED Triage Notes (Signed)
Pt presents with a cough, bodyaches, nasal congestion and fever x 2-3 days.
